# Patient Record
Sex: Female | Born: 1955
Health system: Southern US, Community
[De-identification: ages and names within clinical notes are randomized; demographics above are authoritative.]

## PROBLEM LIST (undated history)

## (undated) DIAGNOSIS — N938 Other specified abnormal uterine and vaginal bleeding: Secondary | ICD-10-CM

## (undated) DIAGNOSIS — R079 Chest pain, unspecified: Secondary | ICD-10-CM

## (undated) DIAGNOSIS — E041 Nontoxic single thyroid nodule: Secondary | ICD-10-CM

## (undated) DIAGNOSIS — F419 Anxiety disorder, unspecified: Secondary | ICD-10-CM

## (undated) DIAGNOSIS — M5030 Other cervical disc degeneration, unspecified cervical region: Secondary | ICD-10-CM

## (undated) DIAGNOSIS — J111 Influenza due to unidentified influenza virus with other respiratory manifestations: Secondary | ICD-10-CM

## (undated) DIAGNOSIS — T8859XA Other complications of anesthesia, initial encounter: Secondary | ICD-10-CM

## (undated) DIAGNOSIS — C9110 Chronic lymphocytic leukemia of B-cell type not having achieved remission: Secondary | ICD-10-CM

## (undated) DIAGNOSIS — R161 Splenomegaly, not elsewhere classified: Secondary | ICD-10-CM

## (undated) DIAGNOSIS — F341 Dysthymic disorder: Secondary | ICD-10-CM

## (undated) DIAGNOSIS — E785 Hyperlipidemia, unspecified: Secondary | ICD-10-CM

## (undated) DIAGNOSIS — B019 Varicella without complication: Secondary | ICD-10-CM

## (undated) DIAGNOSIS — T4145XA Adverse effect of unspecified anesthetic, initial encounter: Secondary | ICD-10-CM

## (undated) DIAGNOSIS — Z9221 Personal history of antineoplastic chemotherapy: Secondary | ICD-10-CM

## (undated) HISTORY — DX: Varicella without complication: B01.9

## (undated) HISTORY — DX: Chronic lymphocytic leukemia of B-cell type not having achieved remission: C91.10

## (undated) HISTORY — DX: Splenomegaly, not elsewhere classified: R16.1

## (undated) HISTORY — DX: Anxiety disorder, unspecified: F41.9

## (undated) HISTORY — DX: Other specified abnormal uterine and vaginal bleeding: N93.8

## (undated) HISTORY — DX: Influenza due to unidentified influenza virus with other respiratory manifestations: J11.1

## (undated) HISTORY — PX: TUBAL LIGATION: SHX77

## (undated) HISTORY — DX: Chest pain, unspecified: R07.9

## (undated) HISTORY — DX: Hyperlipidemia, unspecified: E78.5

## (undated) HISTORY — DX: Nontoxic single thyroid nodule: E04.1

## (undated) HISTORY — DX: Dysthymic disorder: F34.1

---

## 2009-03-06 ENCOUNTER — Ambulatory Visit: Payer: Self-pay | Admitting: Internal Medicine

## 2013-11-06 ENCOUNTER — Other Ambulatory Visit: Payer: Self-pay | Admitting: Family Medicine

## 2013-11-06 ENCOUNTER — Ambulatory Visit: Payer: BC Managed Care – PPO

## 2013-11-06 ENCOUNTER — Ambulatory Visit: Payer: BC Managed Care – PPO | Admitting: Family Medicine

## 2013-11-06 VITALS — BP 132/66 | HR 83 | Temp 97.9°F | Resp 16 | Ht 66.0 in | Wt 146.8 lb

## 2013-11-06 DIAGNOSIS — R110 Nausea: Secondary | ICD-10-CM

## 2013-11-06 DIAGNOSIS — D72820 Lymphocytosis (symptomatic): Secondary | ICD-10-CM

## 2013-11-06 DIAGNOSIS — R079 Chest pain, unspecified: Secondary | ICD-10-CM

## 2013-11-06 DIAGNOSIS — D72829 Elevated white blood cell count, unspecified: Secondary | ICD-10-CM

## 2013-11-06 LAB — POCT CBC
Granulocyte percent: 14.6 %G — AB (ref 37–80)
HEMATOCRIT: 41.5 % (ref 37.7–47.9)
Hemoglobin: 13.3 g/dL (ref 12.2–16.2)
Lymph, poc: 25.8 — AB (ref 0.6–3.4)
MCH: 29 pg (ref 27–31.2)
MCHC: 32 g/dL (ref 31.8–35.4)
MCV: 90.4 fL (ref 80–97)
MID (cbc): 2.1 — AB (ref 0–0.9)
MPV: 11.1 fL (ref 0–99.8)
PLATELET COUNT, POC: 317 10*3/uL (ref 142–424)
POC Granulocyte: 4.8 (ref 2–6.9)
POC LYMPH PERCENT: 79 %L — AB (ref 10–50)
POC MID %: 6.4 %M (ref 0–12)
RBC: 4.59 M/uL (ref 4.04–5.48)
RDW, POC: 15.2 %
WBC: 32.7 10*3/uL — AB (ref 4.6–10.2)

## 2013-11-06 LAB — POCT URINALYSIS DIPSTICK
Bilirubin, UA: NEGATIVE
Blood, UA: NEGATIVE
Glucose, UA: NEGATIVE
KETONES UA: NEGATIVE
Leukocytes, UA: NEGATIVE
Nitrite, UA: NEGATIVE
PH UA: 5.5
Protein, UA: NEGATIVE
Spec Grav, UA: 1.02
UROBILINOGEN UA: 0.2

## 2013-11-06 NOTE — Progress Notes (Signed)
Subjective:    Patient ID: Cathy [name], female    DOB: 1956-01-13, 58 y.o.   MRN: [id number]  11/06/2013  Chest Pain, Neck Pain, Dizziness, Nausea and Arm Pain   HPI This 58 y.o. female presents for evaluation of chest pain, neck pain, nausea, arm pain.  Onset two weeks ago.  Usually occurs during work or when getting ready for work.  B chest pain; child tightness; feels like a belt; B bones of upper arms ache; throat feels tight; ears hurt; temple hurts.  Light-headed sometimes with pain.  Throat feels funny with episodes as well.  No SOB; +bad physical shape; chronic SOB. No diaphoresis with episodes. No cough.  No nausea; two weeks ago, did have four mornings of nausea.  No heartburn or indigestion.  Manager of branch of bank.  Occurs at rest while sitting; no exertional component.   Worked in the yard yesterday without pain.  Feels badly.  No similar symptoms.  No tobacco.  Tons of stress; lots of work stress; lack of employees.  Salaried; works 60 hours per week but this is not new.  Not exercising.  Diagnosed with hyperlipidemia three years ago; adopted a major healthy diet change.  Husband and pt are separating and selling the house.  Two year separation.   Felt badly two weeks ago with nausea; +clammy, +sweats.  Denies fever/chills/sweats now.  Denies sore throat, ear pain constant, cough, vomiting, diarrhea, rash, dysuria, frequency, hematuria.  No leg swelling or calf pain.  No recent travel, sedentary lifestyle, immobilization, surgery.  LMP 2.5 weeks ago.  Continues to have regular menses.    No family hx of early CAD; brother with recent recurrent DVTs; no clotting disorder.   Review of Systems  Constitutional: Negative for fever, chills, diaphoresis and fatigue.  HENT: Negative for congestion, ear pain, postnasal drip, rhinorrhea and sore throat.   Respiratory: Positive for shortness of breath. Negative for cough, wheezing and stridor.   Cardiovascular: Positive for chest pain.  Negative for palpitations and leg swelling.  Gastrointestinal: Negative for nausea, vomiting, abdominal pain, diarrhea and constipation.  Genitourinary: Negative for dysuria, urgency, hematuria and flank pain.  Skin: Negative for rash.  Neurological: Positive for dizziness, light-headedness and headaches. Negative for tremors, syncope, weakness and numbness.  Hematological: Negative for adenopathy.  Psychiatric/Behavioral: The patient is nervous/anxious.     Past Medical History  Diagnosis Date  . Hyperlipidemia    No Known Allergies No current outpatient prescriptions on file.   No current facility-administered medications for this visit.   History   Social History  . Marital Status: Married    Spouse Name: N/A    Number of Children: N/A  . Years of Education: N/A   Occupational History  . Not on file.   Social History Main Topics  . Smoking status: Never Smoker   . Smokeless tobacco: Not on file  . Alcohol Use: No  . Drug Use: No  . Sexual Activity: Not on file   Other Topics Concern  . Not on file   Social History Narrative  . No narrative on file   Family History  Problem Relation Age of Onset  . Stroke Mother 91    CVA  . Stroke Maternal Grandmother   . Stroke Maternal Grandfather   . Deep vein thrombosis Brother 45    recurrent DVTs.       Objective:    BP 132/66  Pulse 83  Temp(Src) 97.9 F (36.6 C) (Oral)  Resp 16  Ht 5\' 6"  (1.676 m)  Wt 146 lb 12.8 oz (66.588 kg)  BMI 23.71 kg/m2  SpO2 99%  LMP 10/20/2013 Physical Exam  Constitutional: She is oriented to person, place, and time. She appears well-developed and well-nourished. No distress.  HENT:  Head: Normocephalic and atraumatic.  Right Ear: External ear normal.  Left Ear: External ear normal.  Nose: Nose normal.  Mouth/Throat: Oropharynx is clear and moist.  Eyes: Conjunctivae and EOM are normal. Pupils are equal, round, and reactive to light.  Neck: Normal range of motion. Neck  supple. Carotid bruit is not present. No thyromegaly present.  Cardiovascular: Normal rate, regular rhythm, normal heart sounds and intact distal pulses.  Exam reveals no gallop and no friction rub.   No murmur heard. Pulmonary/Chest: Effort normal and breath sounds normal. She has no wheezes. She has no rales.  Abdominal: Soft. Bowel sounds are normal. She exhibits no distension and no mass. There is no tenderness. There is no rebound and no guarding.  Lymphadenopathy:    She has no cervical adenopathy.  Neurological: She is alert and oriented to person, place, and time. No cranial nerve deficit.  Skin: Skin is warm and dry. No rash noted. She is not diaphoretic. No erythema. No pallor.  Psychiatric: She has a normal mood and affect. Her behavior is normal.   Results for orders placed in visit on 11/06/13  POCT CBC      Result Value Ref Range   WBC 32.7 (*) 4.6 - 10.2 K/uL   Lymph, poc 25.8 (*) 0.6 - 3.4   POC LYMPH PERCENT 79.0 (*) 10 - 50 %L   MID (cbc) 2.1 (*) 0 - 0.9   POC MID % 6.4  0 - 12 %M   POC Granulocyte 4.8  2 - 6.9   Granulocyte percent 14.6 (*) 37 - 80 %G   RBC 4.59  4.04 - 5.48 M/uL   Hemoglobin 13.3  12.2 - 16.2 g/dL   HCT, POC 41.5  37.7 - 47.9 %   MCV 90.4  80 - 97 fL   MCH, POC 29.0  27 - 31.2 pg   MCHC 32.0  31.8 - 35.4 g/dL   RDW, POC 15.2     Platelet Count, POC 317  142 - 424 K/uL   MPV 11.1  0 - 99.8 fL  POCT URINALYSIS DIPSTICK      Result Value Ref Range   Color, UA yellow     Clarity, UA clear     Glucose, UA neg     Bilirubin, UA neg     Ketones, UA neg     Spec Grav, UA 1.020     Blood, UA neg     pH, UA 5.5     Protein, UA neg     Urobilinogen, UA 0.2     Nitrite, UA neg     Leukocytes, UA Negative     [facility] reading (PRIMARY) by  Dr. Tamala Julian. CXR:  NAD  EKG: NSR; no acute changes.    Assessment & Plan:  Chest pain - Plan: EKG 12-Lead, POCT CBC, COMPLETE METABOLIC PANEL WITH GFR, DG Chest 2 View, Ambulatory referral to Cardiology, CBC with  Differential  Leukocytosis, unspecified - Plan: POCT urinalysis dipstick, Urine culture, CBC with Differential  Nausea alone - Plan: POCT urinalysis dipstick, Urine culture, CBC with Differential  No orders of the defined types were placed in this encounter.    1.  Chest pain:  New. Atypical in nature; no  exertional component.  Associated with recent stressors; also associated with WBC of 32, 000.  No heart murmur heard on exam; normal CXR.  Refer to cardiology for evaluation of chest pain.  To ED for acute worsening. Warrants echo due to associated elevated WBC. 2.  Leukocytosis:  New.  Associated with nausea, chest pain.  CXR normal.  Send urine culture.  Obtain CMET.  Repeat WBC count in upcoming five days; RTC for fever, acute decline.  Recommend echo due to chest pain to evaluate for pericarditis or endocarditis though no distress in office and no heart murmur. 3.  Nausea:  New and now resolved; GERD may be etiology to chest pain; obtain CMET.  No Follow-up on file.     Reginia Forts, M.D.  [facility] [address] [place], [place]  [address] [phone] phone [phone] fax

## 2013-11-06 NOTE — Patient Instructions (Addendum)
1.  Present to emergency department if chest pain becomes much worse. 2.  We will contact you in the upcoming two weeks for appointment with cardiology.   3. Start Aspirin 81mg  one tablet daily.

## 2013-11-07 LAB — CBC WITH DIFFERENTIAL/PLATELET
Basophils Relative: 1 % (ref 0–1)
EOS PCT: 2 % (ref 0–5)
HCT: 39 % (ref 36.0–46.0)
HEMOGLOBIN: 13.3 g/dL (ref 12.0–15.0)
Lymphocytes Relative: 80 % — ABNORMAL HIGH (ref 12–46)
MCH: 29.5 pg (ref 26.0–34.0)
MCHC: 34.1 g/dL (ref 30.0–36.0)
MCV: 86.5 fL (ref 78.0–100.0)
MONOS PCT: 2 % — AB (ref 3–12)
Neutrophils Relative %: 15 % — ABNORMAL LOW (ref 43–77)
Platelets: 288 10*3/uL (ref 150–400)
RBC: 4.51 MIL/uL (ref 3.87–5.11)
RDW: 14.8 % (ref 11.5–15.5)
WBC: 32.5 10*3/uL — ABNORMAL HIGH (ref 4.0–10.5)

## 2013-11-07 LAB — COMPLETE METABOLIC PANEL WITH GFR
ALBUMIN: 4.5 g/dL (ref 3.5–5.2)
ALT: 9 U/L (ref 0–35)
AST: 10 U/L (ref 0–37)
Alkaline Phosphatase: 53 U/L (ref 39–117)
BUN: 20 mg/dL (ref 6–23)
CHLORIDE: 104 meq/L (ref 96–112)
CO2: 23 meq/L (ref 19–32)
Calcium: 9.3 mg/dL (ref 8.4–10.5)
Creat: 0.85 mg/dL (ref 0.50–1.10)
GFR, EST AFRICAN AMERICAN: 89 mL/min
GFR, Est Non African American: 77 mL/min
GLUCOSE: 84 mg/dL (ref 70–99)
POTASSIUM: 4.4 meq/L (ref 3.5–5.3)
Sodium: 139 mEq/L (ref 135–145)
Total Bilirubin: 0.4 mg/dL (ref 0.2–1.2)
Total Protein: 6.9 g/dL (ref 6.0–8.3)

## 2013-11-08 LAB — PATHOLOGIST SMEAR REVIEW

## 2013-11-08 LAB — URINE CULTURE
Colony Count: NO GROWTH
Organism ID, Bacteria: NO GROWTH

## 2013-11-08 LAB — HIV ANTIBODY (ROUTINE TESTING W REFLEX): HIV: NONREACTIVE

## 2013-11-09 ENCOUNTER — Ambulatory Visit: Payer: BC Managed Care – PPO | Admitting: Family Medicine

## 2013-11-09 ENCOUNTER — Other Ambulatory Visit: Payer: Self-pay | Admitting: Family Medicine

## 2013-11-09 VITALS — BP 120/60 | HR 73 | Temp 98.0°F | Resp 16 | Ht 66.0 in | Wt 150.8 lb

## 2013-11-09 DIAGNOSIS — D649 Anemia, unspecified: Secondary | ICD-10-CM

## 2013-11-09 DIAGNOSIS — D72829 Elevated white blood cell count, unspecified: Secondary | ICD-10-CM

## 2013-11-09 DIAGNOSIS — D72819 Decreased white blood cell count, unspecified: Secondary | ICD-10-CM

## 2013-11-09 DIAGNOSIS — R768 Other specified abnormal immunological findings in serum: Secondary | ICD-10-CM

## 2013-11-09 DIAGNOSIS — R110 Nausea: Secondary | ICD-10-CM

## 2013-11-09 LAB — POCT CBC
Granulocyte percent: 15.1 %G — AB (ref 37–80)
HCT, POC: 35.8 % — AB (ref 37.7–47.9)
HEMOGLOBIN: 11.5 g/dL — AB (ref 12.2–16.2)
LYMPH, POC: 23.8 — AB (ref 0.6–3.4)
MCH, POC: 29 pg (ref 27–31.2)
MCHC: 32.1 g/dL (ref 31.8–35.4)
MCV: 90.3 fL (ref 80–97)
MID (CBC): 1.8 — AB (ref 0–0.9)
MPV: 10.4 fL (ref 0–99.8)
POC GRANULOCYTE: 4.6 (ref 2–6.9)
POC LYMPH %: 78.9 % — AB (ref 10–50)
POC MID %: 6 %M (ref 0–12)
Platelet Count, POC: 262 10*3/uL (ref 142–424)
RBC: 3.97 M/uL — AB (ref 4.04–5.48)
RDW, POC: 15.2 %
WBC: 30.2 10*3/uL — AB (ref 4.6–10.2)

## 2013-11-09 LAB — POCT RAPID STREP A (OFFICE): Rapid Strep A Screen: NEGATIVE

## 2013-11-09 LAB — EPSTEIN-BARR VIRUS VCA ANTIBODY PANEL
EBV EA IgG: <5 U/mL (ref <9.0)
EBV NA IgG: 391 U/mL — ABNORMAL HIGH (ref <18.0)
EBV VCA IGM: 18.1 U/mL (ref <36.0)
EBV VCA IgG: >750 U/mL — ABNORMAL HIGH (ref <18.0)

## 2013-11-09 NOTE — Progress Notes (Signed)
Subjective:    Patient ID: Cathy [name], female    DOB: [birth year], 58 y.o.   MRN: [id number]  11/09/2013  Follow-up   HPI This 58 y.o. female presents for one week evaluation of leukocytosis. Evaluated last week and WBC count elevated at 32,000.  Feels unchanged from last visit; denies fever/chills/sweats. Denies sore throat, ear pain, rhinorrhea, cough. Denies dysuria, urgency, hematuria.  Denies vomiting, diarrhea.   +nausea; no stomach pain; no diarrhea; no bloody stools or black stools.  +headaches.  No fatigue or malaise.  No unexpected weight loss.  No rash. No joint pains or swelling; no chronic back pain or neck pain.  Continues to have intermittent chest pain that radiates into arm and jaw; usually occurs at rest while at work.  Referral to cardiology has been placed; pt does not have appointment yet.  No unusual bruising or swollen lymph nodes.  Health Maintenance: Pap smear 2012. Mammogram never. Colonoscopy never. Tetanus vaccine years ago. Flu vaccine never. Eye exam 2012. Dental exam [facility] in [place].  Review of Systems  Constitutional: Negative for fever, chills, diaphoresis, activity change, appetite change, fatigue and unexpected weight change.  HENT: Negative for congestion, dental problem, drooling, ear discharge, ear pain, postnasal drip, rhinorrhea, sinus pressure, sore throat, trouble swallowing and voice change.   Eyes: Negative for visual disturbance.  Respiratory: Negative for cough and shortness of breath.   Cardiovascular: Negative for chest pain, palpitations and leg swelling.  Gastrointestinal: Positive for nausea. Negative for vomiting, abdominal pain, diarrhea, constipation, blood in stool, abdominal distention, anal bleeding and rectal pain.  Endocrine: Negative for cold intolerance, heat intolerance, polydipsia, polyphagia and polyuria.  Genitourinary: Negative for dysuria, urgency, frequency, hematuria, flank pain and genital sores.  Musculoskeletal:  Negative for myalgias, back pain, joint swelling, arthralgias, gait problem, neck pain and neck stiffness.  Skin: Negative for color change, pallor, rash and wound.  Neurological: Negative for dizziness, tremors, seizures, syncope, facial asymmetry, speech difficulty, weakness, light-headedness, numbness and headaches.  Hematological: Negative for adenopathy. Does not bruise/bleed easily.  Psychiatric/Behavioral: The patient is nervous/anxious.     Past Medical History  Diagnosis Date  . Hyperlipidemia   . Chest pain    Past Surgical History  Procedure Laterality Date  . Tubal ligation      No Known Allergies Current Outpatient Prescriptions  Medication Sig Dispense Refill  . ondansetron (ZOFRAN-ODT) 8 MG disintegrating tablet Take 1 tablet (8 mg total) by mouth every 8 (eight) hours as needed for nausea. 20 tablet 1   No current facility-administered medications for this visit.       Objective:    BP 120/60 mmHg  Pulse 73  Temp(Src) 98 F (36.7 C) (Oral)  Resp 16  Ht 5' 6"  (1.676 m)  Wt 150 lb 12.8 oz (68.402 kg)  BMI 24.35 kg/m2  SpO2 99%  LMP 10/20/2013 Physical Exam  Constitutional: She is oriented to person, place, and time. She appears well-developed and well-nourished. No distress.  HENT:  Head: Normocephalic and atraumatic.  Right Ear: External ear normal.  Left Ear: External ear normal.  Nose: Nose normal.  Mouth/Throat: Oropharynx is clear and moist.  Eyes: Conjunctivae and EOM are normal. Pupils are equal, round, and reactive to light.  Neck: Normal range of motion. Neck supple. Carotid bruit is not present. No thyromegaly present.  Cardiovascular: Normal rate, regular rhythm, normal heart sounds and intact distal pulses.  Exam reveals no gallop and no friction rub.   No murmur heard. Pulmonary/Chest: Effort normal  and breath sounds normal. She has no wheezes. She has no rales.  Abdominal: Soft. Bowel sounds are normal. She exhibits no distension and no  mass. There is no tenderness. There is no rebound and no guarding.  Lymphadenopathy:    She has no cervical adenopathy.  Neurological: She is alert and oriented to person, place, and time. No cranial nerve deficit.  Skin: Skin is warm and dry. No rash noted. She is not diaphoretic. No erythema. No pallor.  Psychiatric: She has a normal mood and affect. Her behavior is normal.   Results for orders placed or performed in visit on 11/09/13  Culture, Group A Strep  Result Value Ref Range   Organism ID, Bacteria Normal Upper Respiratory Flora    Organism ID, Bacteria No Beta Hemolytic Streptococci Isolated   ANA  Result Value Ref Range   ANA Ser Ql NEG NEGATIVE  Rheumatoid factor  Result Value Ref Range   Rhuematoid fact SerPl-aCnc 30 (H) <=14 IU/mL  POCT CBC  Result Value Ref Range   WBC 30.2 (A) 4.6 - 10.2 K/uL   Lymph, poc 23.8 (A) 0.6 - 3.4   POC LYMPH PERCENT 78.9 (A) 10 - 50 %L   MID (cbc) 1.8 (A) 0 - 0.9   POC MID % 6.0 0 - 12 %M   POC Granulocyte 4.6 2 - 6.9   Granulocyte percent 15.1 (A) 37 - 80 %G   RBC 3.97 (A) 4.04 - 5.48 M/uL   Hemoglobin 11.5 (A) 12.2 - 16.2 g/dL   HCT, POC 35.8 (A) 37.7 - 47.9 %   MCV 90.3 80 - 97 fL   MCH, POC 29.0 27 - 31.2 pg   MCHC 32.1 31.8 - 35.4 g/dL   RDW, POC 15.2 %   Platelet Count, POC 262 142 - 424 K/uL   MPV 10.4 0 - 99.8 fL  POCT rapid strep A  Result Value Ref Range   Rapid Strep A Screen Negative Negative       Assessment & Plan:  Leukocytopenia, unspecified - Plan: POCT CBC, Culture, Group A Strep  Leukocytosis, unspecified - Plan: Ambulatory referral to Hematology, POCT rapid strep A, ANA, Rheumatoid factor, Culture, Group A Strep  Rheumatoid factor positive - Plan: Cyclic Citrul Peptide Antibody, IGG  Nausea without vomiting  Anemia, unspecified anemia type   1. Leukocytosis:  Persistent; obtain throat culture, rapid strep, ANA, RF, ESR. Refer to hematology. 2.  Positive Rheumatoid Factor: New. Obtain CCP.  After  hematology consultation, consider rheumatology consult. 3. Nausea: persistent; rx for Zofran provided.  Recommend BRAT diet, hydration; recommend OTC Prilosec but patient hesitant to start other medication at this time. 4. Anemia: Persistent; refer to hematology; no previous colonoscopy.   Meds ordered this encounter  Medications  . ondansetron (ZOFRAN-ODT) 8 MG disintegrating tablet    Sig: Take 1 tablet (8 mg total) by mouth every 8 (eight) hours as needed for nausea.    Dispense:  20 tablet    Refill:  1    Return if symptoms worsen or fail to improve.   Reginia Forts, M.D.  [facility] [address] [place], [place]  [address] [phone] phone [phone] fax   .

## 2013-11-10 LAB — RHEUMATOID FACTOR: Rhuematoid fact SerPl-aCnc: 30 IU/mL — ABNORMAL HIGH (ref <=14)

## 2013-11-11 LAB — ANA: Anti Nuclear Antibody(ANA): NEGATIVE

## 2013-11-12 LAB — CULTURE, GROUP A STREP: Organism ID, Bacteria: NORMAL

## 2013-11-14 LAB — CYCLIC CITRUL PEPTIDE ANTIBODY, IGG: Cyclic Citrullin Peptide Ab: <2 U/mL (ref 0.0–5.0)

## 2013-11-17 ENCOUNTER — Encounter: Payer: Self-pay | Admitting: *Deleted

## 2013-11-17 MED ORDER — ONDANSETRON 8 MG PO TBDP: 8.0000 mg | ORAL_TABLET | Freq: Three times a day (TID) | ORAL | Status: DC | PRN

## 2013-11-23 ENCOUNTER — Ambulatory Visit: Payer: Self-pay | Admitting: Internal Medicine

## 2013-11-23 ENCOUNTER — Ambulatory Visit: Payer: Self-pay | Admitting: Oncology

## 2013-11-23 LAB — CBC CANCER CENTER
BASOS ABS: 0.1 x10 3/mm (ref 0.0–0.1)
BASOS PCT: 0.3 %
EOS ABS: 0.2 x10 3/mm (ref 0.0–0.7)
EOS PCT: 0.5 %
HCT: 40.2 % (ref 35.0–47.0)
HGB: 12.9 g/dL (ref 12.0–16.0)
LYMPHS ABS: 27.8 x10 3/mm — AB (ref 1.0–3.6)
Lymphocyte %: 83.7 %
MCH: 28.7 pg (ref 26.0–34.0)
MCHC: 32 g/dL (ref 32.0–36.0)
MCV: 90 fL (ref 80–100)
Monocyte #: 0.7 x10 3/mm (ref 0.2–0.9)
Monocyte %: 2.3 %
Neutrophil #: 4.4 x10 3/mm (ref 1.4–6.5)
Neutrophil %: 13.2 %
Platelet: 324 x10 3/mm (ref 150–440)
RBC: 4.48 10*6/uL (ref 3.80–5.20)
RDW: 14.7 % — AB (ref 11.5–14.5)
WBC: 33.2 x10 3/mm — ABNORMAL HIGH (ref 3.6–11.0)

## 2013-11-23 LAB — LACTATE DEHYDROGENASE: LDH: 161 U/L

## 2013-12-01 ENCOUNTER — Ambulatory Visit (INDEPENDENT_AMBULATORY_CARE_PROVIDER_SITE_OTHER): Payer: BC Managed Care – PPO | Admitting: Cardiology

## 2013-12-01 ENCOUNTER — Encounter: Payer: Self-pay | Admitting: Cardiology

## 2013-12-01 VITALS — BP 120/68 | HR 85 | Ht 65.5 in | Wt 147.4 lb

## 2013-12-01 DIAGNOSIS — R079 Chest pain, unspecified: Secondary | ICD-10-CM

## 2013-12-01 NOTE — Patient Instructions (Signed)
Your physician has requested that you have en exercise stress myoview. For further information please visit [email or web address]. Please follow instruction sheet, as given.  Your physician has requested that you have an echocardiogram. Echocardiography is a painless test that uses sound waves to create images of your heart. It provides your doctor with information about the size and shape of your heart and how well your heart's chambers and valves are working. This procedure takes approximately one hour. There are no restrictions for this procedure.  Your physician recommends that you schedule a follow-up appointment as needed

## 2013-12-01 NOTE — Progress Notes (Signed)
[facility] [place], [place]  [address] Phone: [phone] Fax:  [phone]  Date:  12/01/2013   ID:  Cathy [name], DOB 03/17/1956, MRN [id number]  PCP:  No primary provider on file.  Cardiologist:  Fransico Him, MD     History of Present Illness: Cathy [name] is a 58 y.o. female with history of dyslipidemia who presented to her PCP with complaints of chest pain, neck pain, nausea and arm pain.  This started in March and was occuring up to 10 times a day but it has improved.  She described it as a discomfort in her chest that would radiated into her arms and neck.  She described it as an uncomfortable pressure and throat would get tight.  She denied any SOB or diaphoresis.  She has been having some nausea but this does not occur with the CP.  It is nonexertional.  She is under a lot of stress at work and she and her husband are separating and selling her house.  She has not been to the gym much but usually does not get it at the gym or working in the yard.  She denies any palpitations.  She has had some problems with vertigo associated with nausea but does not coincide with her chest discomfort.  There is no family history of heart disease.   Wt Readings from Last 3 Encounters:  11/09/13 150 lb 12.8 oz (68.402 kg)  11/06/13 146 lb 12.8 oz (66.588 kg)     Past Medical History  Diagnosis Date  . Hyperlipidemia     Current Outpatient Prescriptions  Medication Sig Dispense Refill  . ondansetron (ZOFRAN-ODT) 8 MG disintegrating tablet Take 1 tablet (8 mg total) by mouth every 8 (eight) hours as needed for nausea.  20 tablet  1   No current facility-administered medications for this visit.    Allergies:   No Known Allergies  Social History:  The patient  reports that she has never smoked. She does not have any smokeless tobacco history on file. She reports that she does not drink alcohol or use illicit drugs.   Family History:  The patient's family history includes Deep  vein thrombosis (age of onset: 52) in her brother; Stroke in her maternal grandfather and maternal grandmother; Stroke (age of onset: 59) in her mother.   ROS:  Please see the history of present illness.      All other systems reviewed and negative.   PHYSICAL EXAM: VS:  LMP 10/20/2013 Well nourished, well developed, in no acute distress HEENT: normal Neck: no JVD Cardiac:  normal S1, S2; RRR; no murmur Lungs:  clear to auscultation bilaterally, no wheezing, rhonchi or rales Abd: soft, nontender, no hepatomegaly Ext: no edema Skin: warm and dry Neuro:  CNs 2-12 intact, no focal abnormalities noted  EKG:  NSR on 11/06/2013     ASSESSMENT AND PLAN:  1. Chest pain with typical and atypical symptoms.  Her EKG is nonischemic.  Her only CFR is dyslipidemia.  Her symptoms are concerning though in that her chest discomfort is severe when it occurs and goes into her arms, throat and neck.  This could be due to GERD with esophageal reflux.  She is under a lot of stress. - I will get a stress myoview to rule out ischemia - check 2D echo to assess LVF - If stress test is normal then will try a trial of a PPI  Followup PRN based on results of study  Signed,  Fransico Him, MD 12/01/2013 3:29 PM

## 2013-12-16 ENCOUNTER — Ambulatory Visit: Payer: Self-pay | Admitting: Oncology

## 2013-12-16 ENCOUNTER — Ambulatory Visit: Payer: Self-pay | Admitting: Internal Medicine

## 2013-12-20 ENCOUNTER — Ambulatory Visit (HOSPITAL_COMMUNITY): Payer: BC Managed Care – PPO | Attending: Cardiology | Admitting: Radiology

## 2013-12-20 ENCOUNTER — Ambulatory Visit (HOSPITAL_BASED_OUTPATIENT_CLINIC_OR_DEPARTMENT_OTHER): Payer: BC Managed Care – PPO | Admitting: Cardiology

## 2013-12-20 VITALS — BP 128/68 | HR 71 | Ht 65.5 in | Wt 147.0 lb

## 2013-12-20 DIAGNOSIS — R0602 Shortness of breath: Secondary | ICD-10-CM

## 2013-12-20 DIAGNOSIS — R079 Chest pain, unspecified: Secondary | ICD-10-CM

## 2013-12-20 MED ORDER — TECHNETIUM TC 99M SESTAMIBI GENERIC - CARDIOLITE
10.0000 | Freq: Once | INTRAVENOUS | Status: AC | PRN
  Administered 2013-12-20: 10 via INTRAVENOUS

## 2013-12-20 MED ORDER — TECHNETIUM TC 99M SESTAMIBI GENERIC - CARDIOLITE
30.0000 | Freq: Once | INTRAVENOUS | Status: AC | PRN
  Administered 2013-12-20: 30 via INTRAVENOUS

## 2013-12-20 NOTE — Progress Notes (Signed)
Echo performed. 

## 2013-12-20 NOTE — Progress Notes (Signed)
[facility] [address] [place], [place] [address] [phone]    Cardiology Nuclear Med Study  Cathy [name] is a 58 y.o. female     MRN : [id number]     DOB: 1956-05-10  Procedure Date: 12/20/2013  Nuclear Med Background Indication for Stress Test:  Evaluation for Ischemia History:  No Hx of CAD Cardiac Risk Factors: Lipids  Symptoms:  Chest Pain   Nuclear Pre-Procedure Caffeine/Decaff Intake:  None NPO After: 4:00am   Lungs:  clear O2 Sat: 99% on room air. IV 0.9% NS with Angio Cath:  22g  IV Site: R Antecubital  IV Started by:  Perrin Maltese, EMT-P  Chest Size (in):  36 Cup Size: B  Height: 5' 5.5" (1.664 m)  Weight:  147 lb (66.679 kg)  BMI:  Body mass index is 24.08 kg/(m^2). Tech Comments:  No RX    Nuclear Med Study 1 or 2 day study: 1 day  Stress Test Type:  Stress  Reading MD: n/a  Order Authorizing Provider:  T.Turner MD  Resting Radionuclide: Technetium 46m Sestamibi  Resting Radionuclide Dose: 11.0 mCi   Stress Radionuclide:  Technetium 70m Sestamibi  Stress Radionuclide Dose: 33.0 mCi           Stress Protocol Rest HR: 71 Stress HR: 153  Rest BP: 128/68 Stress BP: 181/62  Exercise Time (min): 8:00 METS: 10.1           Dose of Adenosine (mg):  n/a Dose of Lexiscan: n/a mg  Dose of Atropine (mg): n/a Dose of Dobutamine: n/a mcg/kg/min (at max HR)  Stress Test Technologist: Glade Lloyd, BS-ES  Nuclear Technologist:  Charlton Amor, CNMT     Rest Procedure:  Myocardial perfusion imaging was performed at rest 45 minutes following the intravenous administration of Technetium 69m Sestamibi. Rest ECG: NSR - Normal EKG  Stress Procedure:  The patient exercised on the treadmill utilizing the Bruce Protocol for 8:00 minutes. The patient stopped due to fatigue and denied any chest pain.  Technetium 26m Sestamibi was injected at peak exercise and myocardial perfusion imaging was performed after a brief delay. Stress ECG:  No significant change from baseline ECG  QPS Raw Data Images:  Normal; no motion artifact; normal heart/lung ratio. Stress Images:  Normal homogeneous uptake in all areas of the myocardium. Rest Images:  Normal homogeneous uptake in all areas of the myocardium. Subtraction (SDS):  No evidence of ischemia. Transient Ischemic Dilatation (Normal <1.22):  0.96 Lung/Heart Ratio (Normal <0.45):  0.28  Quantitative Gated Spect Images QGS EDV:  83 ml QGS ESV:  30 ml  Impression Exercise Capacity:  Good exercise capacity. BP Response:  Normal blood pressure response. Clinical Symptoms:  No chest pain. ECG Impression:  No significant ST segment change suggestive of ischemia. Comparison with Prior Nuclear Study: No previous nuclear study performed  Overall Impression:  Normal stress nuclear study.  LV Ejection Fraction: 64%.  LV Wall Motion:  Normal Wall Motion  Darlin Coco MD

## 2013-12-23 ENCOUNTER — Telehealth: Payer: Self-pay | Admitting: Cardiology

## 2013-12-23 NOTE — Telephone Encounter (Signed)
New message ° ° ° ° ° °Returning a nurses call °

## 2014-01-03 LAB — CBC CANCER CENTER
Basophil #: 0.1 x10 3/mm (ref 0.0–0.1)
Basophil %: 0.2 %
EOS PCT: 0.3 %
Eosinophil #: 0.2 x10 3/mm (ref 0.0–0.7)
HCT: 38.3 % (ref 35.0–47.0)
HGB: 12.7 g/dL (ref 12.0–16.0)
Lymphocyte #: 42 x10 3/mm — ABNORMAL HIGH (ref 1.0–3.6)
Lymphocyte %: 87.4 %
MCH: 29.2 pg (ref 26.0–34.0)
MCHC: 33.2 g/dL (ref 32.0–36.0)
MCV: 88 fL (ref 80–100)
MONO ABS: 1.2 x10 3/mm — AB (ref 0.2–0.9)
MONOS PCT: 2.4 %
NEUTROS ABS: 4.7 x10 3/mm (ref 1.4–6.5)
Neutrophil %: 9.7 %
Platelet: 312 x10 3/mm (ref 150–440)
RBC: 4.36 10*6/uL (ref 3.80–5.20)
RDW: 14.5 % (ref 11.5–14.5)
WBC: 48.1 x10 3/mm — ABNORMAL HIGH (ref 3.6–11.0)

## 2014-01-16 ENCOUNTER — Ambulatory Visit: Payer: Self-pay | Admitting: Oncology

## 2014-01-16 ENCOUNTER — Ambulatory Visit: Payer: Self-pay | Admitting: Internal Medicine

## 2014-02-15 ENCOUNTER — Ambulatory Visit: Payer: Self-pay | Admitting: Oncology

## 2014-04-12 ENCOUNTER — Ambulatory Visit: Payer: Self-pay | Admitting: Oncology

## 2014-04-12 LAB — CBC CANCER CENTER
Basophil #: 0.1 x10 3/mm (ref 0.0–0.1)
Basophil %: 0.1 %
EOS ABS: 0.1 x10 3/mm (ref 0.0–0.7)
Eosinophil %: 0.2 %
HCT: 37.7 % (ref 35.0–47.0)
HGB: 11.8 g/dL — ABNORMAL LOW (ref 12.0–16.0)
LYMPHS PCT: 89.7 %
Lymphocyte #: 46.4 x10 3/mm — ABNORMAL HIGH (ref 1.0–3.6)
MCH: 28.5 pg (ref 26.0–34.0)
MCHC: 31.4 g/dL — AB (ref 32.0–36.0)
MCV: 91 fL (ref 80–100)
MONO ABS: 1.2 x10 3/mm — AB (ref 0.2–0.9)
Monocyte %: 2.4 %
NEUTROS ABS: 3.9 x10 3/mm (ref 1.4–6.5)
NEUTROS PCT: 7.6 %
PLATELETS: 298 x10 3/mm (ref 150–440)
RBC: 4.15 10*6/uL (ref 3.80–5.20)
RDW: 14.4 % (ref 11.5–14.5)
WBC: 51.7 x10 3/mm — ABNORMAL HIGH (ref 3.6–11.0)

## 2014-04-18 ENCOUNTER — Ambulatory Visit: Payer: Self-pay | Admitting: Oncology

## 2014-06-28 ENCOUNTER — Ambulatory Visit: Payer: Self-pay | Admitting: Oncology

## 2014-06-28 LAB — CBC CANCER CENTER
Basophil #: 0.1 x10 3/mm (ref 0.0–0.1)
Basophil %: 0.1 %
Eosinophil #: 0.2 x10 3/mm (ref 0.0–0.7)
Eosinophil %: 0.2 %
HCT: 40.4 % (ref 35.0–47.0)
HGB: 12.6 g/dL (ref 12.0–16.0)
LYMPHS PCT: 91.5 %
Lymphocyte #: 63.6 x10 3/mm — ABNORMAL HIGH (ref 1.0–3.6)
MCH: 28.6 pg (ref 26.0–34.0)
MCHC: 31.1 g/dL — ABNORMAL LOW (ref 32.0–36.0)
MCV: 92 fL (ref 80–100)
MONOS PCT: 1.3 %
Monocyte #: 0.9 x10 3/mm (ref 0.2–0.9)
NEUTROS ABS: 4.8 x10 3/mm (ref 1.4–6.5)
Neutrophil %: 6.9 %
Platelet: 283 x10 3/mm (ref 150–440)
RBC: 4.39 10*6/uL (ref 3.80–5.20)
RDW: 14.3 % (ref 11.5–14.5)
WBC: 69.5 x10 3/mm — AB (ref 3.6–11.0)

## 2014-07-18 ENCOUNTER — Ambulatory Visit: Payer: Self-pay | Admitting: Oncology

## 2014-08-23 ENCOUNTER — Ambulatory Visit: Payer: Self-pay | Admitting: Oncology

## 2014-08-23 LAB — CBC CANCER CENTER
BASOS PCT: 0.2 %
Basophil #: 0.2 x10 3/mm — ABNORMAL HIGH (ref 0.0–0.1)
Eosinophil #: 0.4 x10 3/mm (ref 0.0–0.7)
Eosinophil %: 0.5 %
HCT: 39.3 % (ref 35.0–47.0)
HGB: 12.4 g/dL (ref 12.0–16.0)
LYMPHS ABS: 72.2 x10 3/mm — AB (ref 1.0–3.6)
Lymphocyte %: 90.4 %
MCH: 28.9 pg (ref 26.0–34.0)
MCHC: 31.6 g/dL — ABNORMAL LOW (ref 32.0–36.0)
MCV: 92 fL (ref 80–100)
MONO ABS: 1.9 x10 3/mm — AB (ref 0.2–0.9)
Monocyte %: 2.4 %
NEUTROS PCT: 6.5 %
Neutrophil #: 5.2 x10 3/mm (ref 1.4–6.5)
Platelet: 302 x10 3/mm (ref 150–440)
RBC: 4.3 10*6/uL (ref 3.80–5.20)
RDW: 14.4 % (ref 11.5–14.5)
WBC: 79.9 x10 3/mm — ABNORMAL HIGH (ref 3.6–11.0)

## 2014-08-23 LAB — LACTATE DEHYDROGENASE: LDH: 171 U/L (ref 81–246)

## 2014-09-18 ENCOUNTER — Ambulatory Visit: Payer: Self-pay | Admitting: Oncology

## 2014-11-23 ENCOUNTER — Ambulatory Visit
Admit: 2014-11-23 | Disposition: A | Discharge status: Home / Self Care | Payer: Self-pay | Attending: Family Medicine | Admitting: Family Medicine

## 2014-11-28 ENCOUNTER — Ambulatory Visit
Admit: 2014-11-28 | Disposition: A | Discharge status: Home / Self Care | Payer: Self-pay | Attending: Oncology | Admitting: Oncology

## 2014-11-30 ENCOUNTER — Ambulatory Visit
Admit: 2014-11-30 | Disposition: A | Discharge status: Home / Self Care | Payer: Self-pay | Attending: Oncology | Admitting: Oncology

## 2014-11-30 LAB — CBC CANCER CENTER
BANDS NEUTROPHIL: 1 %
Basophil: 1 %
Comment - H1-Com1: NORMAL
Comment - H1-Com2: NORMAL
HCT: 38.9 % (ref 35.0–47.0)
HGB: 11.9 g/dL — AB (ref 12.0–16.0)
Lymphocytes: 83 %
MCH: 28 pg (ref 26.0–34.0)
MCHC: 30.6 g/dL — AB (ref 32.0–36.0)
MCV: 92 fL (ref 80–100)
MONOS PCT: 1 %
PLATELETS: 333 x10 3/mm (ref 150–440)
RBC: 4.25 10*6/uL (ref 3.80–5.20)
RDW: 14.2 % (ref 11.5–14.5)
Segmented Neutrophils: 4 %
Variant Lymphocyte: 10 %
WBC: 121.4 x10 3/mm — ABNORMAL HIGH (ref 3.6–11.0)

## 2014-11-30 LAB — LACTATE DEHYDROGENASE: LDH: 168 U/L

## 2014-12-06 LAB — CBC CANCER CENTER
BASOS ABS: 1 %
BASOS ABS: 1 x10 3/mm — AB (ref 0.0–0.1)
BASOS PCT: 1 %
Bands: 1 %
HCT: 37.8 % (ref 35.0–47.0)
HGB: 11.8 g/dL — ABNORMAL LOW (ref 12.0–16.0)
LYMPHS PCT: 80 %
LYMPHS PCT: 89 %
MCH: 28.5 pg (ref 26.0–34.0)
MCHC: 31.1 g/dL — ABNORMAL LOW (ref 32.0–36.0)
MCV: 92 fL (ref 80–100)
Monocyte %: 3 %
Monocytes: 3 %
NEUTROS PCT: 8 %
PLATELETS: 378 x10 3/mm (ref 150–440)
RBC: 4.13 10*6/uL (ref 3.80–5.20)
RDW: 14.5 % (ref 11.5–14.5)
Segmented Neutrophils: 8 %
Variant Lymphocyte: 7 %
WBC: 124.3 x10 3/mm — AB (ref 3.6–11.0)

## 2014-12-06 LAB — LACTATE DEHYDROGENASE: LDH: 196 U/L — AB

## 2014-12-13 LAB — CBC CANCER CENTER
Basophil #: 0.1 x10 3/mm (ref 0.0–0.1)
Basophil %: 0.2 %
EOS ABS: 0.6 x10 3/mm (ref 0.0–0.7)
EOS PCT: 1 %
HCT: 37.7 % (ref 35.0–47.0)
HGB: 12 g/dL (ref 12.0–16.0)
Lymphocyte #: 53.1 x10 3/mm — ABNORMAL HIGH (ref 1.0–3.6)
Lymphocyte %: 84 %
MCH: 28.2 pg (ref 26.0–34.0)
MCHC: 31.8 g/dL — ABNORMAL LOW (ref 32.0–36.0)
MCV: 89 fL (ref 80–100)
Monocyte #: 1.4 x10 3/mm — ABNORMAL HIGH (ref 0.2–0.9)
Monocyte %: 2.2 %
NEUTROS ABS: 8 x10 3/mm — AB (ref 1.4–6.5)
NEUTROS PCT: 12.6 %
PLATELETS: 327 x10 3/mm (ref 150–440)
RBC: 4.25 10*6/uL (ref 3.80–5.20)
RDW: 14.6 % — AB (ref 11.5–14.5)
WBC: 63.2 x10 3/mm — ABNORMAL HIGH (ref 3.6–11.0)

## 2014-12-13 LAB — BASIC METABOLIC PANEL
ANION GAP: 5 — AB (ref 7–16)
BUN: 12 mg/dL
Calcium, Total: 8.6 mg/dL — ABNORMAL LOW
Chloride: 107 mmol/L
Co2: 24 mmol/L
Creatinine: 0.78 mg/dL
EGFR (Non-African Amer.): >60
Glucose: 103 mg/dL — ABNORMAL HIGH
POTASSIUM: 3.9 mmol/L
Sodium: 136 mmol/L

## 2014-12-13 LAB — CREATININE, SERUM: Creatine, Serum: 0.78

## 2014-12-15 ENCOUNTER — Other Ambulatory Visit: Payer: Self-pay | Admitting: Oncology

## 2014-12-15 DIAGNOSIS — C9110 Chronic lymphocytic leukemia of B-cell type not having achieved remission: Secondary | ICD-10-CM

## 2014-12-20 ENCOUNTER — Ambulatory Visit: Payer: Self-pay

## 2014-12-20 ENCOUNTER — Ambulatory Visit: Payer: Self-pay | Admitting: Oncology

## 2014-12-20 ENCOUNTER — Other Ambulatory Visit: Payer: Self-pay

## 2014-12-20 ENCOUNTER — Inpatient Hospital Stay: Payer: BLUE CROSS/BLUE SHIELD

## 2014-12-20 ENCOUNTER — Inpatient Hospital Stay (HOSPITAL_BASED_OUTPATIENT_CLINIC_OR_DEPARTMENT_OTHER): Payer: BLUE CROSS/BLUE SHIELD | Admitting: Oncology

## 2014-12-20 ENCOUNTER — Inpatient Hospital Stay: Payer: BLUE CROSS/BLUE SHIELD | Attending: Oncology

## 2014-12-20 VITALS — BP 107/66 | HR 81 | Temp 95.2°F | Resp 18 | Wt 142.6 lb

## 2014-12-20 DIAGNOSIS — E785 Hyperlipidemia, unspecified: Secondary | ICD-10-CM

## 2014-12-20 DIAGNOSIS — Z79899 Other long term (current) drug therapy: Secondary | ICD-10-CM

## 2014-12-20 DIAGNOSIS — Z5111 Encounter for antineoplastic chemotherapy: Secondary | ICD-10-CM | POA: Insufficient documentation

## 2014-12-20 DIAGNOSIS — C9110 Chronic lymphocytic leukemia of B-cell type not having achieved remission: Secondary | ICD-10-CM

## 2014-12-20 LAB — CBC WITH DIFFERENTIAL/PLATELET
Basophils Absolute: 0.1 10*3/uL (ref 0–0.1)
EOS ABS: 0.3 10*3/uL (ref 0–0.7)
Eosinophils Relative: 1 %
HEMATOCRIT: 38.2 % (ref 35.0–47.0)
HEMOGLOBIN: 12.1 g/dL (ref 12.0–16.0)
Lymphocytes Relative: 89 %
Lymphs Abs: 60.3 10*3/uL — ABNORMAL HIGH (ref 1.0–3.6)
MCH: 28.5 pg (ref 26.0–34.0)
MCHC: 31.8 g/dL — AB (ref 32.0–36.0)
MCV: 89.8 fL (ref 80.0–100.0)
Monocytes Absolute: 1.2 10*3/uL — ABNORMAL HIGH (ref 0.2–0.9)
NEUTROS ABS: 5.4 10*3/uL (ref 1.4–6.5)
Neutrophils Relative %: 8 %
Platelets: 294 10*3/uL (ref 150–440)
RBC: 4.25 MIL/uL (ref 3.80–5.20)
RDW: 14.6 % — ABNORMAL HIGH (ref 11.5–14.5)
WBC: 67.3 10*3/uL (ref 3.6–11.0)

## 2014-12-20 LAB — BASIC METABOLIC PANEL
Anion gap: 6 (ref 5–15)
BUN: 15 mg/dL (ref 6–20)
CALCIUM: 8.6 mg/dL — AB (ref 8.9–10.3)
CHLORIDE: 107 mmol/L (ref 101–111)
CO2: 25 mmol/L (ref 22–32)
CREATININE: 0.76 mg/dL (ref 0.44–1.00)
GFR calc non Af Amer: >60 mL/min (ref >60)
Glucose, Bld: 97 mg/dL (ref 65–99)
Potassium: 4.2 mmol/L (ref 3.5–5.1)
Sodium: 138 mmol/L (ref 135–145)

## 2014-12-20 MED ORDER — DIPHENHYDRAMINE HCL 25 MG PO CAPS: 50.0000 mg | ORAL_CAPSULE | Freq: Once | ORAL | Status: DC

## 2014-12-20 MED ORDER — DIPHENHYDRAMINE HCL 50 MG/ML IJ SOLN
25.0000 mg | Freq: Once | INTRAMUSCULAR | Status: AC
  Administered 2014-12-20: 50 mg via INTRAVENOUS
  Filled 2014-12-20: qty 1

## 2014-12-20 MED ORDER — ACETAMINOPHEN 325 MG PO TABS
650.0000 mg | ORAL_TABLET | Freq: Once | ORAL | Status: AC
  Administered 2014-12-20: 650 mg via ORAL
  Filled 2014-12-20: qty 2

## 2014-12-20 MED ORDER — SODIUM CHLORIDE 0.9 % IV SOLN
Freq: Once | INTRAVENOUS | Status: AC
  Administered 2014-12-20: 12:00:00 via INTRAVENOUS
  Filled 2014-12-20: qty 250

## 2014-12-20 MED ORDER — SODIUM CHLORIDE 0.9 % IV SOLN: 375.0000 mg/m2 | Freq: Once | INTRAVENOUS | Status: DC

## 2014-12-20 MED ORDER — SODIUM CHLORIDE 0.9 % IV SOLN
600.0000 mg | Freq: Once | INTRAVENOUS | Status: AC
  Administered 2014-12-20: 600 mg via INTRAVENOUS
  Filled 2014-12-20: qty 50

## 2014-12-20 NOTE — Progress Notes (Signed)
[facility]  [phone] [phone]) [phone]  ID: Cathy [name] OB: [birth year]  MR#: [id number]  VZD#:[id number]  Patient Care Team: Wardell Honour, MD as PCP - General (Family Medicine)  CHIEF COMPLAINT:  CLL, Rai stage 1  INTERVAL HISTORY: Patient returns to clinic today for further evaluation and consideration of cycle 3 of 4 of weekly Rituxan. She no longer is complaining of other B symptoms. Since discontinue her allopurinol, her diarrhea has resolved. She has no other neurologic complaints.  She denies any fevers or weight loss.  She has no chest pain and denies any shortness of breath or cough.  She denies any nausea, vomiting, constipation, or diarrhea.  She has no melena or hematochezia.  She has no urinary complaints.  Patient offers no further specific complaints today.  REVIEW OF SYSTEMS:   Review of Systems  Constitutional: Negative for fever and weight loss.  Endo/Heme/Allergies: Negative.     As per HPI. Otherwise, a complete review of systems is negatve.  PAST MEDICAL HISTORY: Past Medical History  Diagnosis Date  . Hyperlipidemia   . Chest pain   . CLL (chronic lymphocytic leukemia)     PAST SURGICAL HISTORY: Past Surgical History  Procedure Laterality Date  . Tubal ligation      FAMILY HISTORY Family History  Problem Relation Age of Onset  . Stroke Mother 44    CVA  . Stroke Maternal Grandmother   . Stroke Maternal Grandfather   . Deep vein thrombosis Brother 45    recurrent DVTs.       ADVANCED DIRECTIVES:    HEALTH MAINTENANCE: History  Substance Use Topics  . Smoking status: Never Smoker   . Smokeless tobacco: Not on file  . Alcohol Use: No     Colonoscopy:  PAP:  Bone density:  Lipid panel:  No Known Allergies  Current Outpatient Prescriptions  Medication Sig Dispense Refill  . allopurinol (ZYLOPRIM) 300 MG tablet Take 300 mg by mouth 2 (two) times daily.    . ondansetron (ZOFRAN-ODT) 8 MG  disintegrating tablet Take 1 tablet (8 mg total) by mouth every 8 (eight) hours as needed for nausea. 20 tablet 1   No current facility-administered medications for this visit.    OBJECTIVE: There were no vitals filed for this visit.   There is no weight on file to calculate BMI.    ECOG FS:0 - Asymptomatic  General: Well-developed, well-nourished, no acute distress. Eyes: anicteric sclera. HEENT: Cervical lymphadenopathy resolved Lungs: Clear to auscultation bilaterally. Heart: Regular rate and rhythm. No rubs, murmurs, or gallops. Abdomen: Soft, nontender, nondistended. No organomegaly noted, normoactive bowel sounds. Musculoskeletal: No edema, cyanosis, or clubbing. Neuro: Alert, answering all questions appropriately. Cranial nerves grossly intact. Skin: No rashes or petechiae noted. Psych: Normal affect.    LAB RESULTS:  Lab Results  Component Value Date   NA 138 12/20/2014   K 4.2 12/20/2014   CL 107 12/20/2014   CO2 25 12/20/2014   GLUCOSE 97 12/20/2014   BUN 15 12/20/2014   CREATININE 0.76 12/20/2014   CALCIUM 8.6* 12/20/2014   PROT 6.9 11/06/2013   ALBUMIN 4.5 11/06/2013   AST 10 11/06/2013   ALT 9 11/06/2013   ALKPHOS 53 11/06/2013   BILITOT 0.4 11/06/2013   GFRNONAA >60 12/20/2014   GFRAA >60 12/20/2014    Lab Results  Component Value Date   WBC 67.3* 12/20/2014   NEUTROABS 5.4 12/20/2014   HGB 12.1 12/20/2014   HCT 38.2 12/20/2014  MCV 89.8 12/20/2014   PLT 294 12/20/2014     STUDIES: No results found.  ASSESSMENT: CLL, Rai stage 1  PLAN:    1.  CLL: Peripheral blood flow cytometry confirmed the results.  CT scan results from November 28, 2014 reviewed independently and noted progression of lymphadenopathy. Her white count is stable. Proceed with cycle 3 of 4 of weekly Rituxan 375 mg/m2. She does not wish to try combination therapy with Treanda at this time. Patient has discontinued allopurinol. Return to clinic in 1 week for consideration of  cycle 4 of 4 of weekly Rituxan. Will consider reimaging 2-3 months after the completion of treatment. 2. Diarrhea: Resolved. Likely secondary to allopurinol.   Patient expressed understanding and was in agreement with this plan. She also understands that She can call clinic at any time with any questions, concerns, or complaints.   No matching staging information was found for the patient.  Lloyd Huger, MD   12/20/2014 4:52 PM

## 2014-12-20 NOTE — Progress Notes (Signed)
Patient had diarrhea while taking the Allopurinol so she stopped the medicine and the diarrhea has subsided.

## 2014-12-24 ENCOUNTER — Other Ambulatory Visit: Payer: Self-pay | Admitting: Oncology

## 2014-12-27 ENCOUNTER — Inpatient Hospital Stay: Payer: BLUE CROSS/BLUE SHIELD

## 2014-12-27 ENCOUNTER — Inpatient Hospital Stay (HOSPITAL_BASED_OUTPATIENT_CLINIC_OR_DEPARTMENT_OTHER): Payer: BLUE CROSS/BLUE SHIELD | Admitting: Oncology

## 2014-12-27 VITALS — BP 115/75 | HR 65 | Temp 95.8°F | Resp 18 | Wt 143.5 lb

## 2014-12-27 DIAGNOSIS — Z79899 Other long term (current) drug therapy: Secondary | ICD-10-CM | POA: Diagnosis not present

## 2014-12-27 DIAGNOSIS — M791 Myalgia: Secondary | ICD-10-CM | POA: Diagnosis not present

## 2014-12-27 DIAGNOSIS — E785 Hyperlipidemia, unspecified: Secondary | ICD-10-CM

## 2014-12-27 DIAGNOSIS — C9110 Chronic lymphocytic leukemia of B-cell type not having achieved remission: Secondary | ICD-10-CM | POA: Diagnosis not present

## 2014-12-27 DIAGNOSIS — Z8673 Personal history of transient ischemic attack (TIA), and cerebral infarction without residual deficits: Secondary | ICD-10-CM

## 2014-12-27 LAB — CBC WITH DIFFERENTIAL/PLATELET
Basophils Absolute: 0.2 10*3/uL — ABNORMAL HIGH (ref 0–0.1)
EOS ABS: 0.2 10*3/uL (ref 0–0.7)
HCT: 37.5 % (ref 35.0–47.0)
Hemoglobin: 12 g/dL (ref 12.0–16.0)
Lymphocytes Relative: 88 %
Lymphs Abs: 45.9 10*3/uL — ABNORMAL HIGH (ref 1.0–3.6)
MCH: 28.4 pg (ref 26.0–34.0)
MCHC: 31.9 g/dL — AB (ref 32.0–36.0)
MCV: 89.2 fL (ref 80.0–100.0)
Monocytes Absolute: 1.3 10*3/uL — ABNORMAL HIGH (ref 0.2–0.9)
Monocytes Relative: 2 %
Neutro Abs: 5 10*3/uL (ref 1.4–6.5)
Neutrophils Relative %: 10 %
PLATELETS: 258 10*3/uL (ref 150–440)
RBC: 4.21 MIL/uL (ref 3.80–5.20)
RDW: 14.4 % (ref 11.5–14.5)
WBC: 52.6 10*3/uL (ref 3.6–11.0)

## 2014-12-27 MED ORDER — ACETAMINOPHEN 325 MG PO TABS
650.0000 mg | ORAL_TABLET | Freq: Once | ORAL | Status: AC
  Administered 2014-12-27: 650 mg via ORAL
  Filled 2014-12-27: qty 2

## 2014-12-27 MED ORDER — SODIUM CHLORIDE 0.9 % IV SOLN
10.0000 mg | Freq: Once | INTRAVENOUS | Status: AC
  Administered 2014-12-27: 10 mg via INTRAVENOUS
  Filled 2014-12-27: qty 1

## 2014-12-27 MED ORDER — SODIUM CHLORIDE 0.9 % IV SOLN: 375.0000 mg/m2 | Freq: Once | INTRAVENOUS | Status: DC

## 2014-12-27 MED ORDER — SODIUM CHLORIDE 0.9 % IV SOLN
375.0000 mg/m2 | Freq: Once | INTRAVENOUS | Status: AC
  Administered 2014-12-27: 700 mg via INTRAVENOUS
  Filled 2014-12-27: qty 20

## 2014-12-27 MED ORDER — DIPHENHYDRAMINE HCL 25 MG PO CAPS
25.0000 mg | ORAL_CAPSULE | Freq: Once | ORAL | Status: AC
  Administered 2014-12-27: 25 mg via ORAL
  Filled 2014-12-27: qty 1

## 2014-12-27 MED ORDER — SODIUM CHLORIDE 0.9 % IV SOLN
Freq: Once | INTRAVENOUS | Status: AC
  Administered 2014-12-27: 12:00:00 via INTRAVENOUS
  Filled 2014-12-27: qty 1000

## 2014-12-27 NOTE — Progress Notes (Signed)
[facility]  Telephone:([phone] [phone]) [phone]  ID: Cathy [name] OB: September 15, 1955  MR#: [id number]  DZH#:[id number]  Patient Care Team: Wardell Honour, MD as PCP - General (Family Medicine)  CHIEF COMPLAINT:  CLL, Rai stage 1  INTERVAL HISTORY: Patient returns to clinic today for further evaluation and consideration of cycle 4 of 4 of weekly Rituxan. She no longer is complaining of B symptoms. She had some achiness and flulike symptoms after her last infusion, but this has since resolved. Since discontinuing her allopurinol, her diarrhea has resolved. She has no other neurologic complaints.  She denies any fevers or weight loss.  She has no chest pain and denies any shortness of breath or cough.  She denies any nausea, vomiting, constipation, or diarrhea.  She has no melena or hematochezia.  She has no urinary complaints.  Patient offers no further specific complaints today.  REVIEW OF SYSTEMS:   Review of Systems  Constitutional: Negative for fever and malaise/fatigue.  Cardiovascular: Negative.   Gastrointestinal: Negative.   Neurological: Negative for weakness.    As per HPI. Otherwise, a complete review of systems is negatve.  PAST MEDICAL HISTORY: Past Medical History  Diagnosis Date  . Hyperlipidemia   . Chest pain   . CLL (chronic lymphocytic leukemia)     PAST SURGICAL HISTORY: Past Surgical History  Procedure Laterality Date  . Tubal ligation      FAMILY HISTORY Family History  Problem Relation Age of Onset  . Stroke Mother 84    CVA  . Stroke Maternal Grandmother   . Stroke Maternal Grandfather   . Deep vein thrombosis Brother 45    recurrent DVTs.       ADVANCED DIRECTIVES:    HEALTH MAINTENANCE: History  Substance Use Topics  . Smoking status: Never Smoker   . Smokeless tobacco: Not on file  . Alcohol Use: No     Colonoscopy:  PAP:  Bone density:  Lipid panel:  No Known Allergies  Current Outpatient  Prescriptions  Medication Sig Dispense Refill  . allopurinol (ZYLOPRIM) 300 MG tablet Take 300 mg by mouth 2 (two) times daily.    . ondansetron (ZOFRAN-ODT) 8 MG disintegrating tablet Take 1 tablet (8 mg total) by mouth every 8 (eight) hours as needed for nausea. 20 tablet 1   No current facility-administered medications for this visit.    OBJECTIVE: Filed Vitals:   12/27/14 1107  BP: 115/75  Pulse: 65  Temp: 95.8 F (35.4 C)  Resp: 18     Body mass index is 22.66 kg/(m^2).    ECOG FS:0 - Asymptomatic  General: Well-developed, well-nourished, no acute distress. Eyes: anicteric sclera. HEENT: Cervical lymphadenopathy resolved Lungs: Clear to auscultation bilaterally. Heart: Regular rate and rhythm. No rubs, murmurs, or gallops. Abdomen: Soft, nontender, nondistended. No organomegaly noted, normoactive bowel sounds. Musculoskeletal: No edema, cyanosis, or clubbing. Neuro: Alert, answering all questions appropriately. Cranial nerves grossly intact. Skin: No rashes or petechiae noted. Psych: Normal affect.    LAB RESULTS:  Lab Results  Component Value Date   NA 138 12/20/2014   K 4.2 12/20/2014   CL 107 12/20/2014   CO2 25 12/20/2014   GLUCOSE 97 12/20/2014   BUN 15 12/20/2014   CREATININE 0.76 12/20/2014   CALCIUM 8.6* 12/20/2014   PROT 6.9 11/06/2013   ALBUMIN 4.5 11/06/2013   AST 10 11/06/2013   ALT 9 11/06/2013   ALKPHOS 53 11/06/2013   BILITOT 0.4 11/06/2013   GFRNONAA >60 12/20/2014  GFRAA >60 12/20/2014    Lab Results  Component Value Date   WBC 52.6* 12/27/2014   NEUTROABS 5.0 12/27/2014   HGB 12.0 12/27/2014   HCT 37.5 12/27/2014   MCV 89.2 12/27/2014   PLT 258 12/27/2014     STUDIES: No results found.  ASSESSMENT: CLL, Rai stage 1  PLAN:    1.  CLL: Peripheral blood flow cytometry confirmed the results.  CT scan results from November 28, 2014 reviewed independently and noted progression of lymphadenopathy. Her white count has trended down  slightly, but still elevated. Proceed with cycle 4 of 4 of weekly Rituxan 375 mg/m2. She does not wish to try combination therapy with Treanda at this time. Patient has discontinued allopurinol. Return to clinic in 6 weeks with repeat imaging, laboratory work, and further evaluation.  2. Diarrhea: Resolved. Likely secondary to allopurinol.  3. Myalgias: Unclear etiology, monitor.  Patient expressed understanding and was in agreement with this plan. She also understands that She can call clinic at any time with any questions, concerns, or complaints.   No matching staging information was found for the patient.  Lloyd Huger, MD   12/27/2014 1:46 PM

## 2014-12-27 NOTE — Progress Notes (Signed)
After treatment last week she started having body aches that seemed to improve daily and still has body aches but not as bad as last week.

## 2015-02-07 ENCOUNTER — Ambulatory Visit
Admission: RE | Admit: 2015-02-07 | Discharge: 2015-02-07 | Disposition: A | Discharge status: Home / Self Care | Payer: BLUE CROSS/BLUE SHIELD | Source: Ambulatory Visit | Attending: Oncology | Admitting: Oncology

## 2015-02-07 DIAGNOSIS — I2510 Atherosclerotic heart disease of native coronary artery without angina pectoris: Secondary | ICD-10-CM | POA: Insufficient documentation

## 2015-02-07 DIAGNOSIS — C9110 Chronic lymphocytic leukemia of B-cell type not having achieved remission: Secondary | ICD-10-CM

## 2015-02-07 MED ORDER — IOHEXOL 350 MG/ML SOLN
100.0000 mL | Freq: Once | INTRAVENOUS | Status: AC | PRN
Start: 2015-02-07 — End: 2015-02-07
  Administered 2015-02-07: 100 mL via INTRAVENOUS

## 2015-02-09 ENCOUNTER — Ambulatory Visit: Payer: BLUE CROSS/BLUE SHIELD

## 2015-02-12 ENCOUNTER — Ambulatory Visit: Payer: BLUE CROSS/BLUE SHIELD | Admitting: Oncology

## 2015-02-12 ENCOUNTER — Other Ambulatory Visit: Payer: BLUE CROSS/BLUE SHIELD

## 2015-02-14 ENCOUNTER — Encounter (INDEPENDENT_AMBULATORY_CARE_PROVIDER_SITE_OTHER): Payer: Self-pay

## 2015-02-14 ENCOUNTER — Inpatient Hospital Stay: Payer: BLUE CROSS/BLUE SHIELD | Attending: Oncology

## 2015-02-14 ENCOUNTER — Inpatient Hospital Stay (HOSPITAL_BASED_OUTPATIENT_CLINIC_OR_DEPARTMENT_OTHER): Payer: BLUE CROSS/BLUE SHIELD | Admitting: Oncology

## 2015-02-14 VITALS — BP 144/73 | HR 73 | Temp 96.5°F | Resp 20 | Wt 145.5 lb

## 2015-02-14 DIAGNOSIS — M5002 Cervical disc disorder with myelopathy, mid-cervical region: Secondary | ICD-10-CM | POA: Insufficient documentation

## 2015-02-14 DIAGNOSIS — E041 Nontoxic single thyroid nodule: Secondary | ICD-10-CM | POA: Insufficient documentation

## 2015-02-14 DIAGNOSIS — Z9221 Personal history of antineoplastic chemotherapy: Secondary | ICD-10-CM

## 2015-02-14 DIAGNOSIS — M791 Myalgia: Secondary | ICD-10-CM | POA: Diagnosis not present

## 2015-02-14 DIAGNOSIS — N281 Cyst of kidney, acquired: Secondary | ICD-10-CM | POA: Insufficient documentation

## 2015-02-14 DIAGNOSIS — I2510 Atherosclerotic heart disease of native coronary artery without angina pectoris: Secondary | ICD-10-CM | POA: Diagnosis not present

## 2015-02-14 DIAGNOSIS — E785 Hyperlipidemia, unspecified: Secondary | ICD-10-CM | POA: Insufficient documentation

## 2015-02-14 DIAGNOSIS — Z79899 Other long term (current) drug therapy: Secondary | ICD-10-CM | POA: Diagnosis not present

## 2015-02-14 DIAGNOSIS — C9110 Chronic lymphocytic leukemia of B-cell type not having achieved remission: Secondary | ICD-10-CM

## 2015-02-14 LAB — CBC WITH DIFFERENTIAL/PLATELET
Basophils Absolute: 0.2 10*3/uL — ABNORMAL HIGH (ref 0–0.1)
Basophils Relative: 1 %
Eosinophils Absolute: 0.1 10*3/uL (ref 0–0.7)
Eosinophils Relative: 0 %
HCT: 39.2 % (ref 35.0–47.0)
Hemoglobin: 12.6 g/dL (ref 12.0–16.0)
Lymphocytes Relative: 91 %
Lymphs Abs: 32.1 10*3/uL — ABNORMAL HIGH (ref 1.0–3.6)
MCH: 28.3 pg (ref 26.0–34.0)
MCHC: 32 g/dL (ref 32.0–36.0)
MCV: 88.5 fL (ref 80.0–100.0)
MONOS PCT: 3 %
Monocytes Absolute: 1 10*3/uL — ABNORMAL HIGH (ref 0.2–0.9)
NEUTROS ABS: 1.8 10*3/uL (ref 1.4–6.5)
NEUTROS PCT: 5 %
Platelets: 268 10*3/uL (ref 150–440)
RBC: 4.43 MIL/uL (ref 3.80–5.20)
RDW: 14.2 % (ref 11.5–14.5)
WBC: 35.3 10*3/uL — AB (ref 3.6–11.0)

## 2015-02-14 LAB — LACTATE DEHYDROGENASE: LDH: 129 U/L (ref 98–192)

## 2015-02-21 NOTE — Progress Notes (Signed)
[facility]  Telephone:(336) [phone] Fax:(336) [phone]  ID: Cathy [name] OB: 06/04/1956  MR#: [id number]  AOZ#:[id number]  Patient Care Team: Wardell Honour, MD as PCP - General (Family Medicine)  CHIEF COMPLAINT:  CLL, Rai stage 1  INTERVAL HISTORY: Patient returns to clinic today for further evaluation and discussion of her imaging results. She currently feels well and is back to her baseline. She denies any B symptoms. She has no neurologic complaints.  She denies any fevers or weight loss.  She has no chest pain and denies any shortness of breath or cough.  She denies any nausea, vomiting, constipation, or diarrhea.  She has no melena or hematochezia.  She has no urinary complaints.  Patient offers no specific complaints today.  REVIEW OF SYSTEMS:   Review of Systems  Constitutional: Negative for fever and malaise/fatigue.  Cardiovascular: Negative.   Gastrointestinal: Negative.   Neurological: Negative for weakness.    As per HPI. Otherwise, a complete review of systems is negatve.  PAST MEDICAL HISTORY: Past Medical History  Diagnosis Date  . Hyperlipidemia   . Chest pain   . CLL (chronic lymphocytic leukemia)     PAST SURGICAL HISTORY: Past Surgical History  Procedure Laterality Date  . Tubal ligation      FAMILY HISTORY Family History  Problem Relation Age of Onset  . Stroke Mother 67    CVA  . Stroke Maternal Grandmother   . Stroke Maternal Grandfather   . Deep vein thrombosis Brother 45    recurrent DVTs.       ADVANCED DIRECTIVES:    HEALTH MAINTENANCE: History  Substance Use Topics  . Smoking status: Never Smoker   . Smokeless tobacco: Not on file  . Alcohol Use: No     Colonoscopy:  PAP:  Bone density:  Lipid panel:  No Known Allergies  Current Outpatient Prescriptions  Medication Sig Dispense Refill  . allopurinol (ZYLOPRIM) 300 MG tablet Take 300 mg by mouth 2 (two) times daily.    . ondansetron  (ZOFRAN-ODT) 8 MG disintegrating tablet Take 1 tablet (8 mg total) by mouth every 8 (eight) hours as needed for nausea. 20 tablet 1   No current facility-administered medications for this visit.    OBJECTIVE: Filed Vitals:   02/14/15 1009  BP: 144/73  Pulse: 73  Temp: 96.5 F (35.8 C)  Resp: 20     Body mass index is 22.97 kg/(m^2).    ECOG FS:0 - Asymptomatic  General: Well-developed, well-nourished, no acute distress. Eyes: anicteric sclera. HEENT: Cervical lymphadenopathy resolved Lungs: Clear to auscultation bilaterally. Heart: Regular rate and rhythm. No rubs, murmurs, or gallops. Abdomen: Soft, nontender, nondistended. No organomegaly noted, normoactive bowel sounds. Musculoskeletal: No edema, cyanosis, or clubbing. Neuro: Alert, answering all questions appropriately. Cranial nerves grossly intact. Skin: No rashes or petechiae noted. Psych: Normal affect.    LAB RESULTS:  Lab Results  Component Value Date   NA 138 12/20/2014   K 4.2 12/20/2014   CL 107 12/20/2014   CO2 25 12/20/2014   GLUCOSE 97 12/20/2014   BUN 15 12/20/2014   CREATININE 0.76 12/20/2014   CALCIUM 8.6* 12/20/2014   PROT 6.9 11/06/2013   ALBUMIN 4.5 11/06/2013   AST 10 11/06/2013   ALT 9 11/06/2013   ALKPHOS 53 11/06/2013   BILITOT 0.4 11/06/2013   GFRNONAA >60 12/20/2014   GFRAA >60 12/20/2014    Lab Results  Component Value Date   WBC 35.3* 02/14/2015   NEUTROABS 1.8 02/14/2015  HGB 12.6 02/14/2015   HCT 39.2 02/14/2015   MCV 88.5 02/14/2015   PLT 268 02/14/2015     STUDIES: Ct Soft Tissue Neck W Contrast  02/07/2015   CLINICAL DATA:  Chronic lymphocytic leukemia restaging. Completed chemotherapy 1 month ago.  EXAM: CT NECK WITH CONTRAST  TECHNIQUE: Multidetector CT imaging of the neck was performed using the standard protocol following the bolus administration of intravenous contrast.  CONTRAST:  126mL OMNIPAQUE IOHEXOL 350 MG/ML SOLN  COMPARISON:  11/28/2014  FINDINGS: Pharynx  and larynx: The nasopharynx is unremarkable. Mild symmetric prominence of the palatine tonsils is unchanged. Larynx is unremarkable.  Salivary glands: Submandibular and parotid glands are unremarkable.  Thyroid: 3.3 cm right thyroid nodule is unchanged. Coarse focal calcification is noted in the left thyroid lobe, also unchanged.  Lymph nodes: Enlarged lymph nodes throughout the neck on the prior study have all significantly decreased in size and now are all subcentimeter in short axis. Submental lymph nodes measure up to 8 mm in short axis (previously 13 mm. Level IB lymph nodes measure up to 7 mm in short axis (previously 15 mm). Level II lymph nodes measure up to 9 mm on the right and 7 mm on the left in short axis (previously 16 and 12 mm, respectively). Right level IV and V lymph nodes measure up to 5 mm in short axis (previously 10-11 mm). Left level V lymph nodes measure up to 6 mm (previously 9 mm).  Vascular: Major vascular structures of the neck appear patent.  Limited intracranial: The visualized portion of the brain is unremarkable.  Visualized orbits: Unremarkable.  Mastoids and visualized paranasal sinuses: Clear.  Skeleton: Mild-to-moderate disc degeneration at C5-6 and C6-7. No destructive osseous lesions identified.  Upper chest: Evaluated on concurrent dedicated chest CT.  IMPRESSION: Evidence of positive response to therapy with decreased size of bilateral cervical lymph nodes, now all subcentimeter in short axis.   Electronically Signed   By: Logan Bores   On: 02/07/2015 11:43   Ct Chest W Contrast  02/07/2015   CLINICAL DATA:  59 year old female with history of chronic lymphocytic leukemia. Chemotherapy completed 1 month ago. Restaging examination.  EXAM: CT CHEST, ABDOMEN, AND PELVIS WITH CONTRAST  TECHNIQUE: Multidetector CT imaging of the chest, abdomen and pelvis was performed following the standard protocol during bolus administration of intravenous contrast.  CONTRAST:  151mL OMNIPAQUE  IOHEXOL 350 MG/ML SOLN  COMPARISON:  CT of the chest, abdomen and pelvis 11/28/2014.  FINDINGS: CT CHEST FINDINGS  Mediastinum/Lymph Nodes: Heart size is normal. There is no significant pericardial fluid, thickening or pericardial calcification. There is atherosclerosis of the thoracic aorta, the great vessels of the mediastinum and the coronary arteries, including calcified atherosclerotic plaque in the left main, left anterior descending and left circumflex coronary arteries. No pathologically enlarged mediastinal or hilar lymph nodes. Esophagus is unremarkable in appearance. Multiple borderline enlarged bilateral axillary lymph nodes measuring up to 8 mm in short axis superior decreased in size compared to the prior examination from 11/28/2014.  Lungs/Pleura: 4 mm subpleural nodule in the periphery of the lateral segment of the right middle lobe) image 36 of series 6) and 3 mm subpleural nodule in the periphery of the right lower lobe (Image 48 of series 6) are both unchanged compared to prior examination 01/17/2014, now considered benign subpleural lymph nodes. No other suspicious appearing pulmonary nodules or masses. No acute consolidative airspace disease. No pleural effusions.  Musculoskeletal/Soft Tissues: There are no aggressive appearing lytic or blastic  lesions noted in the visualized portions of the skeleton.  CT ABDOMEN AND PELVIS FINDINGS  Hepatobiliary: Sub cm low-attenuation lesion in segment 8 of the liver is too small to characterize, but is unchanged compared to prior examinations, favored to represent a tiny cyst. No other suspicious appearing cystic or solid hepatic lesions are noted. No intra or extrahepatic biliary ductal dilatation. Gallbladder is normal in appearance.  Pancreas: No pancreatic mass. No pancreatic ductal dilatation. No pancreatic or peripancreatic fluid or inflammatory changes.  Spleen: Unremarkable.  8 cm in length.  Adrenals/Urinary Tract: Sub cm low-attenuation lesion in  the upper pole of the right kidney is too small characterize, but is unchanged compared to prior studies, likely a tiny cyst. 2.6 x 2.5 cm simple cyst in the upper pole of the left kidney. No hydroureteronephrosis. Bilateral adrenal glands are normal in appearance. Urinary bladder is normal in appearance.  Stomach/Bowel: The appearance of the stomach is normal. No pathologic dilatation of small bowel or colon.  Vascular/Lymphatic: Atherosclerosis throughout the abdominal and pelvic vasculature, without evidence of aneurysm or dissection. Numerous borderline enlarged and enlarged retroperitoneal, upper abdominal and pelvic lymph nodes are again noted, however, these have generally decreased compared to the prior examination. Specific examples include a 2.2 cm short axis portacaval lymph node (previously 3.7 cm), a left para-aortic lymph node measuring 12 mm in short axis (previously 28 mm), and right inguinal lymph node measuring 1 cm in short axis (previously 1.4 cm).  Reproductive: 2.7 cm low-attenuation lesion in the left adnexa has a benign appearance. Uterus and right ovary are normal in appearance.  Other: No significant volume of ascites.  No pneumoperitoneum.  Musculoskeletal: There are no aggressive appearing lytic or blastic lesions noted in the visualized portions of the skeleton.  IMPRESSION: 1. Today's study demonstrates a positive response to therapy with generalized decrease in size of all previous lymphadenopathy in the axillary regions, upper abdomen, retroperitoneum and pelvis, as detailed above. No new sites of disease are noted. 2. Spleen is normal in size. 3. Atherosclerosis, including left main and 2 vessel coronary artery disease. Please note that although the presence of coronary artery calcium documents the presence of coronary artery disease, the severity of this disease and any potential stenosis cannot be assessed on this non-gated CT examination. Assessment for potential risk factor  modification, dietary therapy or pharmacologic therapy may be warranted, if clinically indicated.   Electronically Signed   By: Vinnie Langton M.D.   On: 02/07/2015 12:58   Ct Abdomen Pelvis W Contrast  02/07/2015   CLINICAL DATA:  59 year old female with history of chronic lymphocytic leukemia. Chemotherapy completed 1 month ago. Restaging examination.  EXAM: CT CHEST, ABDOMEN, AND PELVIS WITH CONTRAST  TECHNIQUE: Multidetector CT imaging of the chest, abdomen and pelvis was performed following the standard protocol during bolus administration of intravenous contrast.  CONTRAST:  149mL OMNIPAQUE IOHEXOL 350 MG/ML SOLN  COMPARISON:  CT of the chest, abdomen and pelvis 11/28/2014.  FINDINGS: CT CHEST FINDINGS  Mediastinum/Lymph Nodes: Heart size is normal. There is no significant pericardial fluid, thickening or pericardial calcification. There is atherosclerosis of the thoracic aorta, the great vessels of the mediastinum and the coronary arteries, including calcified atherosclerotic plaque in the left main, left anterior descending and left circumflex coronary arteries. No pathologically enlarged mediastinal or hilar lymph nodes. Esophagus is unremarkable in appearance. Multiple borderline enlarged bilateral axillary lymph nodes measuring up to 8 mm in short axis superior decreased in size compared to the prior examination  from 11/28/2014.  Lungs/Pleura: 4 mm subpleural nodule in the periphery of the lateral segment of the right middle lobe) image 36 of series 6) and 3 mm subpleural nodule in the periphery of the right lower lobe (Image 48 of series 6) are both unchanged compared to prior examination 01/17/2014, now considered benign subpleural lymph nodes. No other suspicious appearing pulmonary nodules or masses. No acute consolidative airspace disease. No pleural effusions.  Musculoskeletal/Soft Tissues: There are no aggressive appearing lytic or blastic lesions noted in the visualized portions of the  skeleton.  CT ABDOMEN AND PELVIS FINDINGS  Hepatobiliary: Sub cm low-attenuation lesion in segment 8 of the liver is too small to characterize, but is unchanged compared to prior examinations, favored to represent a tiny cyst. No other suspicious appearing cystic or solid hepatic lesions are noted. No intra or extrahepatic biliary ductal dilatation. Gallbladder is normal in appearance.  Pancreas: No pancreatic mass. No pancreatic ductal dilatation. No pancreatic or peripancreatic fluid or inflammatory changes.  Spleen: Unremarkable.  8 cm in length.  Adrenals/Urinary Tract: Sub cm low-attenuation lesion in the upper pole of the right kidney is too small characterize, but is unchanged compared to prior studies, likely a tiny cyst. 2.6 x 2.5 cm simple cyst in the upper pole of the left kidney. No hydroureteronephrosis. Bilateral adrenal glands are normal in appearance. Urinary bladder is normal in appearance.  Stomach/Bowel: The appearance of the stomach is normal. No pathologic dilatation of small bowel or colon.  Vascular/Lymphatic: Atherosclerosis throughout the abdominal and pelvic vasculature, without evidence of aneurysm or dissection. Numerous borderline enlarged and enlarged retroperitoneal, upper abdominal and pelvic lymph nodes are again noted, however, these have generally decreased compared to the prior examination. Specific examples include a 2.2 cm short axis portacaval lymph node (previously 3.7 cm), a left para-aortic lymph node measuring 12 mm in short axis (previously 28 mm), and right inguinal lymph node measuring 1 cm in short axis (previously 1.4 cm).  Reproductive: 2.7 cm low-attenuation lesion in the left adnexa has a benign appearance. Uterus and right ovary are normal in appearance.  Other: No significant volume of ascites.  No pneumoperitoneum.  Musculoskeletal: There are no aggressive appearing lytic or blastic lesions noted in the visualized portions of the skeleton.  IMPRESSION: 1. Today's  study demonstrates a positive response to therapy with generalized decrease in size of all previous lymphadenopathy in the axillary regions, upper abdomen, retroperitoneum and pelvis, as detailed above. No new sites of disease are noted. 2. Spleen is normal in size. 3. Atherosclerosis, including left main and 2 vessel coronary artery disease. Please note that although the presence of coronary artery calcium documents the presence of coronary artery disease, the severity of this disease and any potential stenosis cannot be assessed on this non-gated CT examination. Assessment for potential risk factor modification, dietary therapy or pharmacologic therapy may be warranted, if clinically indicated.   Electronically Signed   By: Vinnie Langton M.D.   On: 02/07/2015 12:58    ASSESSMENT: CLL, Rai stage 1  PLAN:    1.  CLL: Peripheral blood flow cytometry confirmed the results.  Patient's white blood cell count is elevated, but stable. She has no other cytopenias. CT scan results reviewed independently and reported as above with significant improvement of her lymphadenopathy. Patient completed 4 cycles of weekly Rituxan in May 2016. She does not require any further treatment at this time, but I suspect she will require treatment in the future possibly with Rituxan and Treanda.  Return to clinic in 3 months with laboratory work and further evaluation. Will consider reimaging in 6 months if indicated. 2. Diarrhea: Resolved. Likely secondary to allopurinol.  3. Myalgias: Unclear etiology, monitor.  Patient expressed understanding and was in agreement with this plan. She also understands that She can call clinic at any time with any questions, concerns, or complaints.    Lloyd Huger, MD   02/21/2015 1:57 PM

## 2015-04-27 ENCOUNTER — Other Ambulatory Visit: Payer: Self-pay | Admitting: Orthopedic Surgery

## 2015-04-27 DIAGNOSIS — M25512 Pain in left shoulder: Secondary | ICD-10-CM

## 2015-05-04 ENCOUNTER — Other Ambulatory Visit: Payer: BLUE CROSS/BLUE SHIELD

## 2015-05-09 ENCOUNTER — Ambulatory Visit
Admission: RE | Admit: 2015-05-09 | Discharge: 2015-05-09 | Disposition: A | Discharge status: Home / Self Care | Payer: BLUE CROSS/BLUE SHIELD | Source: Ambulatory Visit | Attending: Orthopedic Surgery | Admitting: Orthopedic Surgery

## 2015-05-09 DIAGNOSIS — M7552 Bursitis of left shoulder: Secondary | ICD-10-CM | POA: Diagnosis not present

## 2015-05-09 DIAGNOSIS — R590 Localized enlarged lymph nodes: Secondary | ICD-10-CM | POA: Diagnosis not present

## 2015-05-09 DIAGNOSIS — M25512 Pain in left shoulder: Secondary | ICD-10-CM | POA: Diagnosis present

## 2015-05-09 MED ORDER — GADOBENATE DIMEGLUMINE 529 MG/ML IV SOLN: 0.1000 mL | Freq: Once | INTRAVENOUS | Status: DC | PRN

## 2015-05-09 MED ORDER — IOHEXOL 180 MG/ML  SOLN: 5.0000 mL | Freq: Once | INTRAMUSCULAR | Status: DC | PRN

## 2015-05-16 ENCOUNTER — Inpatient Hospital Stay: Payer: BLUE CROSS/BLUE SHIELD | Attending: Oncology

## 2015-05-16 ENCOUNTER — Inpatient Hospital Stay (HOSPITAL_BASED_OUTPATIENT_CLINIC_OR_DEPARTMENT_OTHER): Payer: BLUE CROSS/BLUE SHIELD | Admitting: Oncology

## 2015-05-16 VITALS — BP 122/72 | HR 77 | Temp 97.7°F | Resp 16 | Wt 142.6 lb

## 2015-05-16 DIAGNOSIS — C9110 Chronic lymphocytic leukemia of B-cell type not having achieved remission: Secondary | ICD-10-CM | POA: Insufficient documentation

## 2015-05-16 DIAGNOSIS — R61 Generalized hyperhidrosis: Secondary | ICD-10-CM

## 2015-05-16 DIAGNOSIS — R509 Fever, unspecified: Secondary | ICD-10-CM | POA: Diagnosis not present

## 2015-05-16 DIAGNOSIS — E785 Hyperlipidemia, unspecified: Secondary | ICD-10-CM

## 2015-05-16 DIAGNOSIS — R531 Weakness: Secondary | ICD-10-CM | POA: Diagnosis not present

## 2015-05-16 DIAGNOSIS — M25512 Pain in left shoulder: Secondary | ICD-10-CM | POA: Diagnosis not present

## 2015-05-16 LAB — CBC WITH DIFFERENTIAL/PLATELET
BASOS ABS: 0.1 10*3/uL (ref 0–0.1)
BASOS PCT: 0 %
Eosinophils Absolute: 0.1 10*3/uL (ref 0–0.7)
Eosinophils Relative: 0 %
HEMATOCRIT: 39.5 % (ref 35.0–47.0)
HEMOGLOBIN: 12.7 g/dL (ref 12.0–16.0)
LYMPHS PCT: 84 %
Lymphs Abs: 41.3 10*3/uL — ABNORMAL HIGH (ref 1.0–3.6)
MCH: 28.4 pg (ref 26.0–34.0)
MCHC: 32.3 g/dL (ref 32.0–36.0)
MCV: 88.1 fL (ref 80.0–100.0)
MONO ABS: 1.2 10*3/uL — AB (ref 0.2–0.9)
Monocytes Relative: 2 %
NEUTROS PCT: 14 %
Neutro Abs: 6.7 10*3/uL — ABNORMAL HIGH (ref 1.4–6.5)
Platelets: 329 10*3/uL (ref 150–440)
RBC: 4.48 MIL/uL (ref 3.80–5.20)
RDW: 15.3 % — AB (ref 11.5–14.5)
WBC: 49.4 10*3/uL — AB (ref 3.6–11.0)

## 2015-05-16 LAB — LACTATE DEHYDROGENASE: LDH: 151 U/L (ref 98–192)

## 2015-05-27 NOTE — Progress Notes (Signed)
C[facility] [name]  Telephone:(336) [phone] Fax:(336) [phone]  ID: Cathy [name] OB: January 24, 1956  MR#: [id number]  JQB#:[id number]  Patient Care Team: Wardell Honour, MD as PCP - General (Family Medicine)  CHIEF COMPLAINT:  CLL, Rai stage 1  INTERVAL HISTORY: Patient returns to clinic today for further evaluation and laboratory work. She currently feels well and is asymptomatic. She has occasional night sweats and fevers, but denies any persistent B symptoms. She has no neurologic complaints.  She denies any weight loss.  She has no chest pain and denies any shortness of breath or cough.  She denies any nausea, vomiting, constipation, or diarrhea.  She has no melena or hematochezia.  She has no urinary complaints.  Patient offers no further specific complaints today.  REVIEW OF SYSTEMS:   Review of Systems  Constitutional: Positive for fever. Negative for malaise/fatigue.  Respiratory: Negative.   Cardiovascular: Negative.   Gastrointestinal: Negative.   Neurological: Positive for sensory change. Negative for weakness.    As per HPI. Otherwise, a complete review of systems is negatve.  PAST MEDICAL HISTORY: Past Medical History  Diagnosis Date  . Hyperlipidemia   . Chest pain   . CLL (chronic lymphocytic leukemia)     PAST SURGICAL HISTORY: Past Surgical History  Procedure Laterality Date  . Tubal ligation      FAMILY HISTORY Family History  Problem Relation Age of Onset  . Stroke Mother 60    CVA  . Stroke Maternal Grandmother   . Stroke Maternal Grandfather   . Deep vein thrombosis Brother 45    recurrent DVTs.       ADVANCED DIRECTIVES:    HEALTH MAINTENANCE: Social History  Substance Use Topics  . Smoking status: Never Smoker   . Smokeless tobacco: Not on file  . Alcohol Use: No     Colonoscopy:  PAP:  Bone density:  Lipid panel:  No Known Allergies  No current outpatient prescriptions on file.   No current  facility-administered medications for this visit.    OBJECTIVE: Filed Vitals:   05/16/15 1625  BP: 122/72  Pulse: 77  Temp: 97.7 F (36.5 C)  Resp: 16     Body mass index is 22.52 kg/(m^2).    ECOG FS:0 - Asymptomatic  General: Well-developed, well-nourished, no acute distress. Eyes: anicteric sclera. HEENT: Cervical lymphadenopathy resolved Lungs: Clear to auscultation bilaterally. Heart: Regular rate and rhythm. No rubs, murmurs, or gallops. Abdomen: Soft, nontender, nondistended. No organomegaly noted, normoactive bowel sounds. Musculoskeletal: No edema, cyanosis, or clubbing. Neuro: Alert, answering all questions appropriately. Cranial nerves grossly intact. Skin: No rashes or petechiae noted. Psych: Normal affect.    LAB RESULTS:  Lab Results  Component Value Date   NA 138 12/20/2014   K 4.2 12/20/2014   CL 107 12/20/2014   CO2 25 12/20/2014   GLUCOSE 97 12/20/2014   BUN 15 12/20/2014   CREATININE 0.76 12/20/2014   CALCIUM 8.6* 12/20/2014   PROT 6.9 11/06/2013   ALBUMIN 4.5 11/06/2013   AST 10 11/06/2013   ALT 9 11/06/2013   ALKPHOS 53 11/06/2013   BILITOT 0.4 11/06/2013   GFRNONAA >60 12/20/2014   GFRAA >60 12/20/2014    Lab Results  Component Value Date   WBC 49.4* 05/16/2015   NEUTROABS 6.7* 05/16/2015   HGB 12.7 05/16/2015   HCT 39.5 05/16/2015   MCV 88.1 05/16/2015   PLT 329 05/16/2015     STUDIES: Dg Arthro Shoulder Left  05/09/2015   CLINICAL DATA:  Left shoulder pain after carrying heavy load.  EXAM: ARTHROGRAM OF THE LEFT SHOULDER  FLUOROSCOPY TIME:  Radiation Exposure Index (as provided by the fluoroscopic device): 5.3 mGy  TECHNIQUE: After discussing the risks and benefits of this procedure the patient informed consent was obtained. Left shoulder sterilely prepped and draped. Following local anesthesia with 1% lidocaine a 22 gauge spinal needle was advanced into the left shoulder joint under fluoroscopic guidance. Standardized mixture of  saline, lidocaine, and Magnevist administered. No complications.  COMPARISON:  None.  FINDINGS: Successful left shoulder joint injection for left shoulder MRI arthrogram. No complications.  IMPRESSION: Successful left shoulder joint injection for left shoulder MRI arthrogram.   Electronically Signed   By: Marcello Moores  Register   On: 05/09/2015 14:06   Mr Shoulder Left W Contrast  05/09/2015   CLINICAL DATA:  Decreased range of motion. Left shoulder pain and weakness. Not improving.  EXAM: MR ARTHROGRAM OF THE LEFT SHOULDER  TECHNIQUE: Multiplanar, multisequence MR imaging of the left shoulder was performed following the administration of intra-articular contrast.  CONTRAST:  See Injection Documentation.  COMPARISON:  None.  FINDINGS: Rotator cuff: Supraspinatus tendon is intact. Infraspinatus tendon is intact. Teres minor tendon is intact. Subscapularis tendon is intact.  Muscles: No atrophy or fatty replacement of nor abnormal signal within, the muscles of the rotator cuff.  Biceps long head: Intact.  Acromioclavicular Joint: Normal acromioclavicular joint. Type II acromion. Small amount subacromial/subdeltoid bursal fluid.  Glenohumeral Joint: No chondral defect. No dislocation. Normal glenohumeral ligaments.  Labrum: Intact.  Bones: No marrow signal abnormality.  No fracture or dislocation.  Soft tissue: No fluid collection or hematoma. Left axillary lymphadenopathy partially visualized with the largest lymph node measuring 15 mm in short axis.  IMPRESSION: 1. No internal derangement of the left shoulder. 2. Mild subacromial/subdeltoid bursitis. 3. Nonspecific left axillary lymphadenopathy. Given the patient's prior history of CLL, this may reflect recurrent disease. Recommend correlation with laboratory values and a CT of the chest with intravenous contrast.   Electronically Signed   By: Kathreen Devoid   On: 05/09/2015 13:43    ASSESSMENT: CLL, Rai stage 1  PLAN:    1.  CLL: Peripheral blood flow cytometry  confirmed the results.  Patient's white blood cell count is trending back up, but she has no other cytopenias. She is having occasional B symptoms. CT scan results from February 07, 2015 reviewed independently with significant improvement of her lymphadenopathy. Patient completed 4 cycles of weekly Rituxan in May 2016. She does not require any further treatment at this time, but I suspect she will require treatment in the future possibly with Rituxan and Treanda or FCR.  Return to clinic in 3 months with laboratory work and further evaluation. Patient also has requested to delay further imaging at least 2017 secondary to financial concerns.   Patient expressed understanding and was in agreement with this plan. She also understands that She can call clinic at any time with any questions, concerns, or complaints.    Lloyd Huger, MD   05/27/2015 12:09 PM

## 2015-08-16 ENCOUNTER — Other Ambulatory Visit: Payer: Self-pay | Admitting: Nurse Practitioner

## 2015-08-16 ENCOUNTER — Inpatient Hospital Stay: Payer: BLUE CROSS/BLUE SHIELD

## 2015-08-16 ENCOUNTER — Inpatient Hospital Stay: Payer: BLUE CROSS/BLUE SHIELD | Admitting: Oncology

## 2015-08-27 ENCOUNTER — Inpatient Hospital Stay: Payer: BLUE CROSS/BLUE SHIELD

## 2015-08-27 ENCOUNTER — Inpatient Hospital Stay: Payer: BLUE CROSS/BLUE SHIELD | Admitting: Oncology

## 2015-09-06 ENCOUNTER — Encounter (INDEPENDENT_AMBULATORY_CARE_PROVIDER_SITE_OTHER): Payer: Self-pay

## 2015-09-06 ENCOUNTER — Inpatient Hospital Stay: Payer: Managed Care, Other (non HMO) | Attending: Oncology

## 2015-09-06 ENCOUNTER — Inpatient Hospital Stay (HOSPITAL_BASED_OUTPATIENT_CLINIC_OR_DEPARTMENT_OTHER): Payer: Managed Care, Other (non HMO) | Admitting: Oncology

## 2015-09-06 ENCOUNTER — Encounter: Payer: Self-pay | Admitting: Oncology

## 2015-09-06 VITALS — BP 132/74 | HR 80 | Temp 98.4°F | Resp 18 | Ht 66.0 in | Wt 145.3 lb

## 2015-09-06 DIAGNOSIS — E785 Hyperlipidemia, unspecified: Secondary | ICD-10-CM | POA: Insufficient documentation

## 2015-09-06 DIAGNOSIS — Z823 Family history of stroke: Secondary | ICD-10-CM | POA: Diagnosis not present

## 2015-09-06 DIAGNOSIS — C9110 Chronic lymphocytic leukemia of B-cell type not having achieved remission: Secondary | ICD-10-CM | POA: Diagnosis not present

## 2015-09-06 LAB — CBC WITH DIFFERENTIAL/PLATELET
Basophils Absolute: 0.2 10*3/uL — ABNORMAL HIGH (ref 0–0.1)
Eosinophils Absolute: 0.2 10*3/uL (ref 0–0.7)
Eosinophils Relative: 0 %
HEMATOCRIT: 40 % (ref 35.0–47.0)
Hemoglobin: 12.6 g/dL (ref 12.0–16.0)
Lymphocytes Relative: 90 %
Lymphs Abs: 77.6 10*3/uL — ABNORMAL HIGH (ref 1.0–3.6)
MCH: 28 pg (ref 26.0–34.0)
MCHC: 31.6 g/dL — ABNORMAL LOW (ref 32.0–36.0)
MCV: 88.6 fL (ref 80.0–100.0)
MONO ABS: 1.5 10*3/uL — AB (ref 0.2–0.9)
Monocytes Relative: 2 %
Neutro Abs: 6.9 10*3/uL — ABNORMAL HIGH (ref 1.4–6.5)
Platelets: 302 10*3/uL (ref 150–440)
RBC: 4.51 MIL/uL (ref 3.80–5.20)
RDW: 14.5 % (ref 11.5–14.5)
WBC: 86.4 10*3/uL (ref 3.6–11.0)

## 2015-09-12 NOTE — Progress Notes (Signed)
[place]  Telephone:(336) [phone] Fax:(336) [phone]  ID: Franne Forts OB: [birth year]  MR#: [id number]  [id number]  Patient Care Team: Wardell Honour, MD as PCP - General (Family Medicine)  CHIEF COMPLAINT:  CLL, Rai stage 1  INTERVAL HISTORY: Patient returns to clinic today for further evaluation and laboratory work. She continues to feel well and is asymptomatic. She denies any night sweats, fevers, or other B symptoms. She has no neurologic complaints.  She denies any weight loss.  She has no chest pain and denies any shortness of breath or cough.  She denies any nausea, vomiting, constipation, or diarrhea.  She has no melena or hematochezia.  She has no urinary complaints.  Patient offers no further specific complaints today.  REVIEW OF SYSTEMS:   Review of Systems  Constitutional: Negative for fever, weight loss and malaise/fatigue.  Respiratory: Negative.   Cardiovascular: Negative.   Gastrointestinal: Negative.   Neurological: Negative for sensory change and weakness.    As per HPI. Otherwise, a complete review of systems is negatve.  PAST MEDICAL HISTORY: Past Medical History  Diagnosis Date  . Hyperlipidemia   . Chest pain   . CLL (chronic lymphocytic leukemia) ([place])     PAST SURGICAL HISTORY: Past Surgical History  Procedure Laterality Date  . Tubal ligation      FAMILY HISTORY Family History  Problem Relation Age of Onset  . Stroke Mother 10    CVA  . Stroke Maternal Grandmother   . Stroke Maternal Grandfather   . Deep vein thrombosis Brother 45    recurrent DVTs.       ADVANCED DIRECTIVES:    HEALTH MAINTENANCE: Social History  Substance Use Topics  . Smoking status: Never Smoker   . Smokeless tobacco: None  . Alcohol Use: No     Colonoscopy:  PAP:  Bone density:  Lipid panel:  No Known Allergies  No current outpatient prescriptions on file.   No current facility-administered medications for this  visit.    OBJECTIVE: Filed Vitals:   09/06/15 1411  BP: 132/74  Pulse: 80  Temp: 98.4 F (36.9 C)  Resp: 18     Body mass index is 23.46 kg/(m^2).    ECOG FS:0 - Asymptomatic  General: Well-developed, well-nourished, no acute distress. Eyes: anicteric sclera. HEENT: Cervical lymphadenopathy resolved Lungs: Clear to auscultation bilaterally. Heart: Regular rate and rhythm. No rubs, murmurs, or gallops. Abdomen: Soft, nontender, nondistended. No organomegaly noted, normoactive bowel sounds. Musculoskeletal: No edema, cyanosis, or clubbing. Neuro: Alert, answering all questions appropriately. Cranial nerves grossly intact. Skin: No rashes or petechiae noted. Psych: Normal affect.    LAB RESULTS:  Lab Results  Component Value Date   NA 138 12/20/2014   K 4.2 12/20/2014   CL 107 12/20/2014   CO2 25 12/20/2014   GLUCOSE 97 12/20/2014   BUN 15 12/20/2014   CREATININE 0.76 12/20/2014   CALCIUM 8.6* 12/20/2014   PROT 6.9 11/06/2013   ALBUMIN 4.5 11/06/2013   AST 10 11/06/2013   ALT 9 11/06/2013   ALKPHOS 53 11/06/2013   BILITOT 0.4 11/06/2013   GFRNONAA >60 12/20/2014   GFRAA >60 12/20/2014    Lab Results  Component Value Date   WBC 86.4* 09/06/2015   NEUTROABS 6.9* 09/06/2015   HGB 12.6 09/06/2015   HCT 40.0 09/06/2015   MCV 88.6 09/06/2015   PLT 302 09/06/2015     STUDIES: No results found.  ASSESSMENT: CLL, Rai stage 1  PLAN:  1.  CLL: Peripheral blood flow cytometry confirmed the results.  Patient's white blood cell count is trending back up, but she has no other cytopenias. She denies any B symptoms. CT scan results from February 07, 2015 reviewed independently with significant improvement of her lymphadenopathy. Patient completed 4 cycles of weekly Rituxan in May 2016. She does not require any further treatment at this time, but I suspect she will require treatment in the future possibly with Rituxan and Treanda or FCR.  Return to clinic in 3 months with  laboratory work and further evaluation. Patient also has requested to delay further imaging until she has significant progression of disease and treatment is necessary.    Patient expressed understanding and was in agreement with this plan. She also understands that She can call clinic at any time with any questions, concerns, or complaints.    Lloyd Huger, MD   09/12/2015 5:24 AM

## 2015-12-06 ENCOUNTER — Ambulatory Visit: Payer: BLUE CROSS/BLUE SHIELD | Admitting: Oncology

## 2015-12-06 ENCOUNTER — Other Ambulatory Visit: Payer: BLUE CROSS/BLUE SHIELD

## 2015-12-10 ENCOUNTER — Other Ambulatory Visit: Payer: Self-pay | Admitting: *Deleted

## 2015-12-10 DIAGNOSIS — C9110 Chronic lymphocytic leukemia of B-cell type not having achieved remission: Secondary | ICD-10-CM

## 2015-12-11 ENCOUNTER — Inpatient Hospital Stay (HOSPITAL_BASED_OUTPATIENT_CLINIC_OR_DEPARTMENT_OTHER): Payer: Managed Care, Other (non HMO) | Admitting: Oncology

## 2015-12-11 ENCOUNTER — Inpatient Hospital Stay: Payer: Managed Care, Other (non HMO) | Attending: Oncology

## 2015-12-11 VITALS — BP 120/71 | HR 77 | Temp 97.4°F | Resp 16 | Wt 144.8 lb

## 2015-12-11 DIAGNOSIS — Z9221 Personal history of antineoplastic chemotherapy: Secondary | ICD-10-CM | POA: Diagnosis not present

## 2015-12-11 DIAGNOSIS — C9110 Chronic lymphocytic leukemia of B-cell type not having achieved remission: Secondary | ICD-10-CM | POA: Diagnosis not present

## 2015-12-11 DIAGNOSIS — E785 Hyperlipidemia, unspecified: Secondary | ICD-10-CM | POA: Diagnosis not present

## 2015-12-11 DIAGNOSIS — R61 Generalized hyperhidrosis: Secondary | ICD-10-CM

## 2015-12-11 LAB — CBC WITH DIFFERENTIAL/PLATELET
BASOS PCT: 0 %
Basophils Absolute: 0 10*3/uL (ref 0–0.1)
Eosinophils Absolute: 0 10*3/uL (ref 0–0.7)
Eosinophils Relative: 0 %
HCT: 39.7 % (ref 35.0–47.0)
HEMOGLOBIN: 12.5 g/dL (ref 12.0–16.0)
LYMPHS PCT: 96 %
Lymphs Abs: 121.4 10*3/uL — ABNORMAL HIGH (ref 1.0–3.6)
MCH: 28.7 pg (ref 26.0–34.0)
MCHC: 31.5 g/dL — ABNORMAL LOW (ref 32.0–36.0)
MCV: 91.2 fL (ref 80.0–100.0)
MONOS PCT: 1 %
Monocytes Absolute: 1.3 10*3/uL — ABNORMAL HIGH (ref 0.2–0.9)
NEUTROS ABS: 3.8 10*3/uL (ref 1.4–6.5)
Neutrophils Relative %: 3 %
Platelets: 255 10*3/uL (ref 150–440)
RBC: 4.35 MIL/uL (ref 3.80–5.20)
RDW: 15 % — ABNORMAL HIGH (ref 11.5–14.5)
WBC: 126.5 10*3/uL (ref 3.6–11.0)

## 2015-12-11 LAB — LACTATE DEHYDROGENASE: LDH: 164 U/L (ref 98–192)

## 2015-12-11 NOTE — Progress Notes (Signed)
Patient does not offer any problems today.  

## 2015-12-17 ENCOUNTER — Telehealth: Payer: Self-pay | Admitting: Oncology

## 2015-12-17 NOTE — Progress Notes (Signed)
[place]  Telephone:(336) [phone] [phone]) [phone]  ID: Cathy [name] OB: 1956-05-31  MR#: [id number]  [id number]  Patient Care Team: Wardell Honour, MD as PCP - General (Family Medicine)  CHIEF COMPLAINT:  CLL, Rai stage 1  INTERVAL HISTORY: Patient returns to clinic today for further evaluation and laboratory work. She continues to feel well and is asymptomatic. She admits to occasional night sweats, but denies any fevers or other B symptoms. She has no neurologic complaints.  She denies any weight loss.  She has no chest pain and denies any shortness of breath or cough.  She denies any nausea, vomiting, constipation, or diarrhea.  She has no melena or hematochezia.  She has no urinary complaints.  Patient offers no further specific complaints today.  REVIEW OF SYSTEMS:   Review of Systems  Constitutional: Positive for diaphoresis. Negative for fever, weight loss and malaise/fatigue.  Respiratory: Negative.   Cardiovascular: Negative.   Gastrointestinal: Negative.   Musculoskeletal: Negative.   Neurological: Negative for sensory change and weakness.  Psychiatric/Behavioral: Negative.     As per HPI. Otherwise, a complete review of systems is negatve.  PAST MEDICAL HISTORY: Past Medical History  Diagnosis Date  . Hyperlipidemia   . Chest pain   . CLL (chronic lymphocytic leukemia) ([place])     PAST SURGICAL HISTORY: Past Surgical History  Procedure Laterality Date  . Tubal ligation      FAMILY HISTORY Family History  Problem Relation Age of Onset  . Stroke Mother 73    CVA  . Stroke Maternal Grandmother   . Stroke Maternal Grandfather   . Deep vein thrombosis Brother 45    recurrent DVTs.       ADVANCED DIRECTIVES:    HEALTH MAINTENANCE: Social History  Substance Use Topics  . Smoking status: Never Smoker   . Smokeless tobacco: Not on file  . Alcohol Use: No     Colonoscopy:  PAP:  Bone density:  Lipid panel:  No  Known Allergies  No current outpatient prescriptions on file.   No current facility-administered medications for this visit.    OBJECTIVE: Filed Vitals:   12/11/15 1500  BP: 120/71  Pulse: 77  Temp: 97.4 F (36.3 C)  Resp: 16     Body mass index is 23.39 kg/(m^2).    ECOG FS:0 - Asymptomatic  General: Well-developed, well-nourished, no acute distress. Eyes: anicteric sclera. HEENT: Cervical lymphadenopathy resolved Lungs: Clear to auscultation bilaterally. Heart: Regular rate and rhythm. No rubs, murmurs, or gallops. Abdomen: Soft, nontender, nondistended. No organomegaly noted, normoactive bowel sounds. Musculoskeletal: No edema, cyanosis, or clubbing. Neuro: Alert, answering all questions appropriately. Cranial nerves grossly intact. Skin: No rashes or petechiae noted. Psych: Normal affect.    LAB RESULTS:  Lab Results  Component Value Date   NA 138 12/20/2014   K 4.2 12/20/2014   CL 107 12/20/2014   CO2 25 12/20/2014   GLUCOSE 97 12/20/2014   BUN 15 12/20/2014   CREATININE 0.76 12/20/2014   CALCIUM 8.6* 12/20/2014   PROT 6.9 11/06/2013   ALBUMIN 4.5 11/06/2013   AST 10 11/06/2013   ALT 9 11/06/2013   ALKPHOS 53 11/06/2013   BILITOT 0.4 11/06/2013   GFRNONAA >60 12/20/2014   GFRAA >60 12/20/2014    Lab Results  Component Value Date   WBC 126.5* 12/11/2015   NEUTROABS 3.8 12/11/2015   HGB 12.5 12/11/2015   HCT 39.7 12/11/2015   MCV 91.2 12/11/2015   PLT 255 12/11/2015  STUDIES: No results found.  ASSESSMENT: CLL, Rai stage 1  PLAN:    1.  CLL: Peripheral blood flow cytometry confirmed the results.  Patient's white blood cell count has significantly increased and now has a doubling time of approximately 6 months. She has no other cytopenias. She admits to night sweats, but no other B symptoms. CT scan results from February 07, 2015 reviewed independently with significant improvement of her lymphadenopathy. Patient completed 4 cycles of weekly  Rituxan in May 2016. Have recommended to patient that she reinitiate at minimum Rituxan treatments although she might get a more durable response with Rituxan plus Treanda or FCR.  Patient is declining any treatment at this time, but has agreed to return to clinic in 1 month for repeat laboratory work and further evaluation.    Approximately 30 minutes was spent in discussion of which greater than 50% was consultation.  Patient expressed understanding and was in agreement with this plan. She also understands that She can call clinic at any time with any questions, concerns, or complaints.    Lloyd Huger, MD   12/17/2015 12:59 PM

## 2015-12-17 NOTE — Telephone Encounter (Signed)
Would like her to have rituxan the same day if possible,  Thanks.

## 2015-12-17 NOTE — Telephone Encounter (Signed)
Patient said when she came for last md visit Woodfin Ganja wanted to get her in the next week to start tx but she said she had been in denial and asked to wait a month and do labs again. Since then she has had time to think about it and realizes she does not want to wait. There was an opening on Finn's schedule this Wednesday and (since he originally wanted her to come in the week following her previous appt) we went ahead and put her on for Wednesday, 12/19/15 per pt request.

## 2015-12-18 ENCOUNTER — Telehealth: Payer: Self-pay | Admitting: Oncology

## 2015-12-18 NOTE — Telephone Encounter (Signed)
Patient informed that she is scheduled for Rituxan but Dr. Grayland Ormond will discuss other possible options at that appt.

## 2015-12-18 NOTE — Telephone Encounter (Signed)
Patient said to go ahead and schedule for lab/md/Rituxan next Tuesday (already done per [facility]) but she wanted to verify that it is actually Rituxan because she thought Woodfin Ganja had said when they last spoke that he felt she wasn't responding well to the Rituxan and he might want to try a more traditional chemo. She said if that is the case to please let her know what chemo it will be and to call her and leave a detailed voicemail about it on [phone]. She said if it is going to be Rituxan that she recalled last year having to go through a process getting it approved by insurance and now she has [employer] and wants to make sure it is approved in time. Thanks.

## 2015-12-19 ENCOUNTER — Inpatient Hospital Stay: Payer: Managed Care, Other (non HMO) | Admitting: Oncology

## 2015-12-19 ENCOUNTER — Inpatient Hospital Stay: Payer: Managed Care, Other (non HMO)

## 2015-12-19 ENCOUNTER — Other Ambulatory Visit: Payer: Self-pay | Admitting: Oncology

## 2015-12-19 DIAGNOSIS — C9110 Chronic lymphocytic leukemia of B-cell type not having achieved remission: Secondary | ICD-10-CM

## 2015-12-25 ENCOUNTER — Other Ambulatory Visit: Payer: Self-pay | Admitting: *Deleted

## 2015-12-25 ENCOUNTER — Inpatient Hospital Stay: Payer: Managed Care, Other (non HMO)

## 2015-12-25 ENCOUNTER — Inpatient Hospital Stay (HOSPITAL_BASED_OUTPATIENT_CLINIC_OR_DEPARTMENT_OTHER): Payer: Managed Care, Other (non HMO) | Admitting: Oncology

## 2015-12-25 ENCOUNTER — Inpatient Hospital Stay: Payer: Managed Care, Other (non HMO) | Attending: Oncology

## 2015-12-25 VITALS — BP 142/70 | HR 78 | Wt 142.4 lb

## 2015-12-25 DIAGNOSIS — C9110 Chronic lymphocytic leukemia of B-cell type not having achieved remission: Secondary | ICD-10-CM

## 2015-12-25 DIAGNOSIS — R232 Flushing: Secondary | ICD-10-CM | POA: Diagnosis not present

## 2015-12-25 DIAGNOSIS — R61 Generalized hyperhidrosis: Secondary | ICD-10-CM | POA: Insufficient documentation

## 2015-12-25 DIAGNOSIS — R5383 Other fatigue: Secondary | ICD-10-CM | POA: Diagnosis not present

## 2015-12-25 DIAGNOSIS — R51 Headache: Secondary | ICD-10-CM | POA: Insufficient documentation

## 2015-12-25 DIAGNOSIS — Z823 Family history of stroke: Secondary | ICD-10-CM

## 2015-12-25 DIAGNOSIS — E785 Hyperlipidemia, unspecified: Secondary | ICD-10-CM | POA: Diagnosis not present

## 2015-12-25 DIAGNOSIS — R531 Weakness: Secondary | ICD-10-CM | POA: Diagnosis not present

## 2015-12-25 DIAGNOSIS — R600 Localized edema: Secondary | ICD-10-CM | POA: Diagnosis not present

## 2015-12-25 DIAGNOSIS — Z5112 Encounter for antineoplastic immunotherapy: Secondary | ICD-10-CM | POA: Diagnosis present

## 2015-12-25 LAB — COMPREHENSIVE METABOLIC PANEL
ALT: 14 U/L (ref 14–54)
AST: 20 U/L (ref 15–41)
Albumin: 4.3 g/dL (ref 3.5–5.0)
Alkaline Phosphatase: 47 U/L (ref 38–126)
Anion gap: 7 (ref 5–15)
BUN: 18 mg/dL (ref 6–20)
CHLORIDE: 105 mmol/L (ref 101–111)
CO2: 25 mmol/L (ref 22–32)
Calcium: 8.8 mg/dL — ABNORMAL LOW (ref 8.9–10.3)
Creatinine, Ser: 0.94 mg/dL (ref 0.44–1.00)
Glucose, Bld: 93 mg/dL (ref 65–99)
POTASSIUM: 4 mmol/L (ref 3.5–5.1)
Sodium: 137 mmol/L (ref 135–145)
TOTAL PROTEIN: 6.5 g/dL (ref 6.5–8.1)
Total Bilirubin: 0.6 mg/dL (ref 0.3–1.2)

## 2015-12-25 LAB — CBC WITH DIFFERENTIAL/PLATELET
BASOS PCT: 0 %
Basophils Absolute: 0 10*3/uL (ref 0–0.1)
EOS ABS: 0 10*3/uL (ref 0–0.7)
Eosinophils Relative: 0 %
HCT: 34.7 % — ABNORMAL LOW (ref 35.0–47.0)
Hemoglobin: 11.7 g/dL — ABNORMAL LOW (ref 12.0–16.0)
LYMPHS ABS: 130.1 10*3/uL — AB (ref 1.0–3.6)
Lymphocytes Relative: 91 %
MCH: 29.8 pg (ref 26.0–34.0)
MCHC: 33.8 g/dL (ref 32.0–36.0)
MCV: 88.2 fL (ref 80.0–100.0)
MONO ABS: 7.2 10*3/uL — AB (ref 0.2–0.9)
Monocytes Relative: 5 %
NEUTROS ABS: 5.7 10*3/uL (ref 1.4–6.5)
Neutrophils Relative %: 4 %
PLATELETS: 307 10*3/uL (ref 150–440)
RBC: 3.93 MIL/uL (ref 3.80–5.20)
RDW: 15.1 % — AB (ref 11.5–14.5)
WBC: 143 10*3/uL (ref 3.6–11.0)

## 2015-12-25 MED ORDER — SODIUM CHLORIDE 0.9 % IV SOLN
375.0000 mg/m2 | Freq: Once | INTRAVENOUS | Status: AC
  Administered 2015-12-25: 700 mg via INTRAVENOUS
  Filled 2015-12-25: qty 60

## 2015-12-25 MED ORDER — ACETAMINOPHEN 325 MG PO TABS
650.0000 mg | ORAL_TABLET | Freq: Once | ORAL | Status: AC
  Administered 2015-12-25: 650 mg via ORAL
  Filled 2015-12-25: qty 2

## 2015-12-25 MED ORDER — DIPHENHYDRAMINE HCL 25 MG PO CAPS
25.0000 mg | ORAL_CAPSULE | Freq: Once | ORAL | Status: AC
  Administered 2015-12-25: 25 mg via ORAL
  Filled 2015-12-25: qty 1

## 2015-12-25 MED ORDER — SODIUM CHLORIDE 0.9 % IV SOLN
Freq: Once | INTRAVENOUS | Status: AC
  Administered 2015-12-25: 11:00:00 via INTRAVENOUS
  Filled 2015-12-25: qty 1000

## 2015-12-25 MED ORDER — DEXAMETHASONE 4 MG PO TABS
10.0000 mg | ORAL_TABLET | Freq: Once | ORAL | Status: AC
  Administered 2015-12-25: 10 mg via ORAL
  Filled 2015-12-25: qty 3

## 2016-01-01 ENCOUNTER — Inpatient Hospital Stay (HOSPITAL_BASED_OUTPATIENT_CLINIC_OR_DEPARTMENT_OTHER): Payer: Managed Care, Other (non HMO) | Admitting: Oncology

## 2016-01-01 ENCOUNTER — Inpatient Hospital Stay: Payer: Managed Care, Other (non HMO)

## 2016-01-01 VITALS — BP 120/48 | HR 74 | Temp 97.7°F | Wt 141.5 lb

## 2016-01-01 DIAGNOSIS — Z823 Family history of stroke: Secondary | ICD-10-CM

## 2016-01-01 DIAGNOSIS — C9110 Chronic lymphocytic leukemia of B-cell type not having achieved remission: Secondary | ICD-10-CM | POA: Diagnosis not present

## 2016-01-01 DIAGNOSIS — E785 Hyperlipidemia, unspecified: Secondary | ICD-10-CM | POA: Diagnosis not present

## 2016-01-01 DIAGNOSIS — Z5112 Encounter for antineoplastic immunotherapy: Secondary | ICD-10-CM | POA: Diagnosis not present

## 2016-01-01 LAB — CBC WITH DIFFERENTIAL/PLATELET
BASOS ABS: 1.6 10*3/uL — AB (ref 0–0.1)
Basophils Relative: 1 %
EOS PCT: 0 %
Eosinophils Absolute: 0 10*3/uL (ref 0–0.7)
HCT: 35.9 % (ref 35.0–47.0)
HEMOGLOBIN: 12 g/dL (ref 12.0–16.0)
LYMPHS ABS: 152.2 10*3/uL — AB (ref 1.0–3.6)
LYMPHS PCT: 93 %
MCH: 29.4 pg (ref 26.0–34.0)
MCHC: 33.4 g/dL (ref 32.0–36.0)
MCV: 88.1 fL (ref 80.0–100.0)
MONOS PCT: 1 %
Monocytes Absolute: 1.6 10*3/uL — ABNORMAL HIGH (ref 0.2–0.9)
NEUTROS ABS: 8.2 10*3/uL — AB (ref 1.4–6.5)
Neutrophils Relative %: 5 %
PLATELETS: 248 10*3/uL (ref 150–440)
RBC: 4.07 MIL/uL (ref 3.80–5.20)
RDW: 14.9 % — ABNORMAL HIGH (ref 11.5–14.5)
WBC: 163.6 10*3/uL (ref 3.6–11.0)

## 2016-01-01 LAB — COMPREHENSIVE METABOLIC PANEL
ALK PHOS: 51 U/L (ref 38–126)
ALT: 17 U/L (ref 14–54)
AST: 15 U/L (ref 15–41)
Albumin: 4.4 g/dL (ref 3.5–5.0)
Anion gap: 5 (ref 5–15)
BUN: 20 mg/dL (ref 6–20)
CHLORIDE: 109 mmol/L (ref 101–111)
CO2: 25 mmol/L (ref 22–32)
Calcium: 8.9 mg/dL (ref 8.9–10.3)
Creatinine, Ser: 0.89 mg/dL (ref 0.44–1.00)
GFR calc Af Amer: >60 mL/min (ref >60)
GFR calc non Af Amer: >60 mL/min (ref >60)
Glucose, Bld: 95 mg/dL (ref 65–99)
Potassium: 4.3 mmol/L (ref 3.5–5.1)
SODIUM: 139 mmol/L (ref 135–145)
Total Bilirubin: 0.5 mg/dL (ref 0.3–1.2)
Total Protein: 6.9 g/dL (ref 6.5–8.1)

## 2016-01-01 MED ORDER — SODIUM CHLORIDE 0.9 % IV SOLN: 375.0000 mg/m2 | Freq: Once | INTRAVENOUS | Status: DC

## 2016-01-01 MED ORDER — SODIUM CHLORIDE 0.9 % IV SOLN
10.0000 mg | Freq: Once | INTRAVENOUS | Status: AC
  Administered 2016-01-01: 10 mg via INTRAVENOUS
  Filled 2016-01-01: qty 1

## 2016-01-01 MED ORDER — ACETAMINOPHEN 325 MG PO TABS
650.0000 mg | ORAL_TABLET | Freq: Once | ORAL | Status: AC
  Administered 2016-01-01: 650 mg via ORAL
  Filled 2016-01-01: qty 2

## 2016-01-01 MED ORDER — DIPHENHYDRAMINE HCL 25 MG PO CAPS
25.0000 mg | ORAL_CAPSULE | Freq: Once | ORAL | Status: AC
  Administered 2016-01-01: 25 mg via ORAL
  Filled 2016-01-01: qty 1

## 2016-01-01 MED ORDER — SODIUM CHLORIDE 0.9 % IV SOLN
700.0000 mg | Freq: Once | INTRAVENOUS | Status: AC
  Administered 2016-01-01: 700 mg via INTRAVENOUS
  Filled 2016-01-01: qty 60

## 2016-01-01 MED ORDER — SODIUM CHLORIDE 0.9 % IV SOLN
Freq: Once | INTRAVENOUS | Status: AC
  Administered 2016-01-01: 10:00:00 via INTRAVENOUS
  Filled 2016-01-01: qty 1000

## 2016-01-01 NOTE — Progress Notes (Signed)
[place]  Telephone:(336) [phone] Fax:(336) [phone]  ID: Franne Forts OB: [birth year]  MR#: [id number]  [id number]  Patient Care Team: Wardell Honour, MD as PCP - General (Family Medicine)  CHIEF COMPLAINT:  CLL, Rai stage 1  INTERVAL HISTORY: Patient returns to clinic today for further evaluation and cycle 2 of weekly Rituxan. Patient reports that she had flu like symptoms for a couple days post cycle 1 and she remembers the last time she had Rituxan in 2016 she was give a steroid that helped with those post infusion symptoms. Otherwise she tolerated cycle 1 well. She continues to feel well and is asymptomatic. She denies any fevers or B symptoms. She has no neurologic complaints.  She denies any weight loss.  She has no chest pain and denies any shortness of breath or cough.  She denies any nausea, vomiting, constipation, or diarrhea.  She has no melena or hematochezia.  She has no urinary complaints.  Patient offers no further specific complaints today.  REVIEW OF SYSTEMS:   Review of Systems  Constitutional: Negative for fever, weight loss, malaise/fatigue and diaphoresis.  Respiratory: Negative.  Negative for cough and shortness of breath.   Cardiovascular: Negative.  Negative for chest pain.  Gastrointestinal: Negative.   Genitourinary: Negative.   Musculoskeletal: Negative.   Neurological: Negative for sensory change and weakness.  Psychiatric/Behavioral: Negative.     As per HPI. Otherwise, a complete review of systems is negatve.  PAST MEDICAL HISTORY: Past Medical History  Diagnosis Date  . Hyperlipidemia   . Chest pain   . CLL (chronic lymphocytic leukemia) ([place])     PAST SURGICAL HISTORY: Past Surgical History  Procedure Laterality Date  . Tubal ligation      FAMILY HISTORY Family History  Problem Relation Age of Onset  . Stroke Mother 74    CVA  . Stroke Maternal Grandmother   . Stroke Maternal Grandfather   . Deep vein  thrombosis Brother 45    recurrent DVTs.       ADVANCED DIRECTIVES:    HEALTH MAINTENANCE: Social History  Substance Use Topics  . Smoking status: Never Smoker   . Smokeless tobacco: Not on file  . Alcohol Use: No     No Known Allergies  No current outpatient prescriptions on file.   No current facility-administered medications for this visit.    OBJECTIVE: Filed Vitals:   01/01/16 0844  BP: 120/48  Pulse: 74  Temp: 97.7 F (36.5 C)     Body mass index is 22.86 kg/(m^2).    ECOG FS:0 - Asymptomatic  General: Well-developed, well-nourished, no acute distress. Eyes: anicteric sclera. HEENT: Cervical lymphadenopathy resolved Lungs: Clear to auscultation bilaterally. Heart: Regular rate and rhythm. No rubs, murmurs, or gallops. Abdomen: Soft, nontender, nondistended. No organomegaly noted, normoactive bowel sounds. Musculoskeletal: No edema, cyanosis, or clubbing. Neuro: Alert, answering all questions appropriately. Cranial nerves grossly intact. Skin: No rashes or petechiae noted. Psych: Normal affect.    LAB RESULTS:  Lab Results  Component Value Date   NA 139 01/01/2016   K 4.3 01/01/2016   CL 109 01/01/2016   CO2 25 01/01/2016   GLUCOSE 95 01/01/2016   BUN 20 01/01/2016   CREATININE 0.89 01/01/2016   CALCIUM 8.9 01/01/2016   PROT 6.9 01/01/2016   ALBUMIN 4.4 01/01/2016   AST 15 01/01/2016   ALT 17 01/01/2016   ALKPHOS 51 01/01/2016   BILITOT 0.5 01/01/2016   GFRNONAA >60 01/01/2016   GFRAA >60 01/01/2016  Lab Results  Component Value Date   WBC 163.6* 01/01/2016   NEUTROABS PENDING 01/01/2016   HGB 12.0 01/01/2016   HCT 35.9 01/01/2016   MCV 88.1 01/01/2016   PLT 248 01/01/2016     STUDIES: No results found.  ASSESSMENT: CLL, Rai stage 1  PLAN:    1.  CLL: Peripheral blood flow cytometry confirmed the results. Patient completed 4 cycles of weekly Rituxan in May 2016. Proceed with cycle 2 of 4 of weekly Rituxan. WBC increased from  143 to 163 this week, expect this to decrease with continued infusions.  We will assess the durability of her repeat Rituxan dosing, but patient will likely need more aggressive treatment in the future possibly with Rituxan plus Treanda or FCR.  Return to clinic in 1 week for consideration of cycle 3.  2. Flu-like symptoms post infusion - Will add IV decadron 10 mg to remaining infusions.  Patient expressed understanding and was in agreement with this plan. She also understands that She can call clinic at any time with any questions, concerns, or complaints.    Mayra Reel, NP   01/01/2016 9:22 AM   Patient was seen and evaluated independently and I agree with the assessment and plan as dictated above.  Lloyd Huger, MD 01/04/2016 1:14 PM

## 2016-01-01 NOTE — Progress Notes (Signed)
[place]  Telephone:(336) [phone] Fax:(336) [phone]  ID: Cathy [name] OB: 03/27/1956  MR#: [id number]  [id number]  Patient Care Team: Wardell Honour, MD as PCP - General (Family Medicine)  CHIEF COMPLAINT:  CLL, Rai stage 1  INTERVAL HISTORY: Patient returns to clinic today for further evaluation and initiation of cycle 1 of weekly Rituxan. She continues to feel well and is asymptomatic. She admits to occasional night sweats, but denies any fevers or other B symptoms. She has no neurologic complaints.  She denies any weight loss.  She has no chest pain and denies any shortness of breath or cough.  She denies any nausea, vomiting, constipation, or diarrhea.  She has no melena or hematochezia.  She has no urinary complaints.  Patient offers no further specific complaints today.  REVIEW OF SYSTEMS:   Review of Systems  Constitutional: Positive for diaphoresis. Negative for fever, weight loss and malaise/fatigue.  Respiratory: Negative.  Negative for cough and shortness of breath.   Cardiovascular: Negative.  Negative for chest pain.  Gastrointestinal: Negative.   Genitourinary: Negative.   Musculoskeletal: Negative.   Neurological: Negative for sensory change and weakness.  Psychiatric/Behavioral: Negative.     As per HPI. Otherwise, a complete review of systems is negatve.  PAST MEDICAL HISTORY: Past Medical History  Diagnosis Date  . Hyperlipidemia   . Chest pain   . CLL (chronic lymphocytic leukemia) ([place])     PAST SURGICAL HISTORY: Past Surgical History  Procedure Laterality Date  . Tubal ligation      FAMILY HISTORY Family History  Problem Relation Age of Onset  . Stroke Mother 27    CVA  . Stroke Maternal Grandmother   . Stroke Maternal Grandfather   . Deep vein thrombosis Brother 45    recurrent DVTs.       ADVANCED DIRECTIVES:    HEALTH MAINTENANCE: Social History  Substance Use Topics  . Smoking status: Never Smoker    . Smokeless tobacco: Not on file  . Alcohol Use: No     Colonoscopy:  PAP:  Bone density:  Lipid panel:  No Known Allergies  No current outpatient prescriptions on file.   No current facility-administered medications for this visit.    OBJECTIVE: Filed Vitals:   12/25/15 0905  BP: 142/70  Pulse: 78     Body mass index is 23 kg/(m^2).    ECOG FS:0 - Asymptomatic  General: Well-developed, well-nourished, no acute distress. Eyes: anicteric sclera. HEENT: Cervical lymphadenopathy resolved Lungs: Clear to auscultation bilaterally. Heart: Regular rate and rhythm. No rubs, murmurs, or gallops. Abdomen: Soft, nontender, nondistended. No organomegaly noted, normoactive bowel sounds. Musculoskeletal: No edema, cyanosis, or clubbing. Neuro: Alert, answering all questions appropriately. Cranial nerves grossly intact. Skin: No rashes or petechiae noted. Psych: Normal affect.    LAB RESULTS:  Lab Results  Component Value Date   NA 137 12/25/2015   K 4.0 12/25/2015   CL 105 12/25/2015   CO2 25 12/25/2015   GLUCOSE 93 12/25/2015   BUN 18 12/25/2015   CREATININE 0.94 12/25/2015   CALCIUM 8.8* 12/25/2015   PROT 6.5 12/25/2015   ALBUMIN 4.3 12/25/2015   AST 20 12/25/2015   ALT 14 12/25/2015   ALKPHOS 47 12/25/2015   BILITOT 0.6 12/25/2015   GFRNONAA >60 12/25/2015   GFRAA >60 12/25/2015    Lab Results  Component Value Date   WBC 143.0* 12/25/2015   NEUTROABS 5.7 12/25/2015   HGB 11.7* 12/25/2015   HCT 34.7* 12/25/2015  MCV 88.2 12/25/2015   PLT 307 12/25/2015     STUDIES: No results found.  ASSESSMENT: CLL, Rai stage 1  PLAN:    1.  CLL: Peripheral blood flow cytometry confirmed the results.  Patient's white blood cell count has significantly increased and now has a doubling time of less than 6 months. She has no other cytopenias. She admits to night sweats, but no other B symptoms. CT scan results from February 07, 2015 reviewed independently with significant  improvement of her lymphadenopathy. Patient completed 4 cycles of weekly Rituxan in May 2016. Proceed with cycle 1 of 4 of weekly Rituxan. We will assess the durability of her repeat Rituxan dosing, but patient will likely need more aggressive treatment in the future possibly with Rituxan plus Treanda or FCR.  Return to clinic in 1 week for consideration of cycle 2.   Patient expressed understanding and was in agreement with this plan. She also understands that She can call clinic at any time with any questions, concerns, or complaints.    Cathy Huger, MD   01/01/2016 8:40 AM

## 2016-01-01 NOTE — Progress Notes (Signed)
When patient received Rituxan in 2016 she was receiving a Dexamethasone with her pre meds.  But this treatment regimen does not include the Dexamethasone and she can really tell a difference after treatment because she has flu like symptoms that she did not have last year.

## 2016-01-08 ENCOUNTER — Inpatient Hospital Stay (HOSPITAL_BASED_OUTPATIENT_CLINIC_OR_DEPARTMENT_OTHER): Payer: Managed Care, Other (non HMO) | Admitting: Oncology

## 2016-01-08 ENCOUNTER — Inpatient Hospital Stay: Payer: Managed Care, Other (non HMO)

## 2016-01-08 VITALS — BP 128/78 | HR 79 | Temp 97.9°F | Resp 18 | Wt 143.7 lb

## 2016-01-08 DIAGNOSIS — R51 Headache: Secondary | ICD-10-CM

## 2016-01-08 DIAGNOSIS — R600 Localized edema: Secondary | ICD-10-CM

## 2016-01-08 DIAGNOSIS — C9110 Chronic lymphocytic leukemia of B-cell type not having achieved remission: Secondary | ICD-10-CM

## 2016-01-08 DIAGNOSIS — R5383 Other fatigue: Secondary | ICD-10-CM

## 2016-01-08 DIAGNOSIS — E785 Hyperlipidemia, unspecified: Secondary | ICD-10-CM

## 2016-01-08 DIAGNOSIS — R61 Generalized hyperhidrosis: Secondary | ICD-10-CM

## 2016-01-08 DIAGNOSIS — Z5112 Encounter for antineoplastic immunotherapy: Secondary | ICD-10-CM | POA: Diagnosis not present

## 2016-01-08 DIAGNOSIS — Z823 Family history of stroke: Secondary | ICD-10-CM

## 2016-01-08 DIAGNOSIS — R232 Flushing: Secondary | ICD-10-CM

## 2016-01-08 DIAGNOSIS — R531 Weakness: Secondary | ICD-10-CM

## 2016-01-08 LAB — CBC WITH DIFFERENTIAL/PLATELET
BASOS PCT: 0 %
Basophils Absolute: 0 10*3/uL (ref 0–0.1)
EOS PCT: 0 %
Eosinophils Absolute: 0 10*3/uL (ref 0–0.7)
HEMATOCRIT: 34.3 % — AB (ref 35.0–47.0)
HEMOGLOBIN: 11.4 g/dL — AB (ref 12.0–16.0)
LYMPHS PCT: 97 %
Lymphs Abs: 160.4 10*3/uL — ABNORMAL HIGH (ref 1.0–3.6)
MCH: 29.7 pg (ref 26.0–34.0)
MCHC: 33.2 g/dL (ref 32.0–36.0)
MCV: 89.6 fL (ref 80.0–100.0)
MONOS PCT: 0 %
Monocytes Absolute: 0 10*3/uL — ABNORMAL LOW (ref 0.2–0.9)
NEUTROS ABS: 5 10*3/uL (ref 1.4–6.5)
Neutrophils Relative %: 3 %
Platelets: 248 10*3/uL (ref 150–440)
RBC: 3.83 MIL/uL (ref 3.80–5.20)
RDW: 15.2 % — ABNORMAL HIGH (ref 11.5–14.5)
WBC: 165.4 10*3/uL (ref 3.6–11.0)

## 2016-01-08 LAB — COMPREHENSIVE METABOLIC PANEL
ALK PHOS: 50 U/L (ref 38–126)
ALT: 14 U/L (ref 14–54)
ANION GAP: 5 (ref 5–15)
AST: 17 U/L (ref 15–41)
Albumin: 4.2 g/dL (ref 3.5–5.0)
BILIRUBIN TOTAL: 0.4 mg/dL (ref 0.3–1.2)
BUN: 17 mg/dL (ref 6–20)
CALCIUM: 8.7 mg/dL — AB (ref 8.9–10.3)
CO2: 25 mmol/L (ref 22–32)
CREATININE: 0.81 mg/dL (ref 0.44–1.00)
Chloride: 109 mmol/L (ref 101–111)
GFR calc non Af Amer: >60 mL/min (ref >60)
Glucose, Bld: 98 mg/dL (ref 65–99)
Potassium: 4 mmol/L (ref 3.5–5.1)
Sodium: 139 mmol/L (ref 135–145)
TOTAL PROTEIN: 6.5 g/dL (ref 6.5–8.1)

## 2016-01-08 MED ORDER — DIPHENHYDRAMINE HCL 25 MG PO CAPS
25.0000 mg | ORAL_CAPSULE | Freq: Once | ORAL | Status: AC
  Administered 2016-01-08: 25 mg via ORAL
  Filled 2016-01-08: qty 1

## 2016-01-08 MED ORDER — SODIUM CHLORIDE 0.9 % IV SOLN
375.0000 mg/m2 | Freq: Once | INTRAVENOUS | Status: AC
  Administered 2016-01-08: 700 mg via INTRAVENOUS
  Filled 2016-01-08: qty 60

## 2016-01-08 MED ORDER — SODIUM CHLORIDE 0.9 % IV SOLN
10.0000 mg | Freq: Once | INTRAVENOUS | Status: AC
  Administered 2016-01-08: 10 mg via INTRAVENOUS
  Filled 2016-01-08: qty 1

## 2016-01-08 MED ORDER — SODIUM CHLORIDE 0.9 % IV SOLN: 375.0000 mg/m2 | Freq: Once | INTRAVENOUS | Status: DC

## 2016-01-08 MED ORDER — SODIUM CHLORIDE 0.9 % IV SOLN
Freq: Once | INTRAVENOUS | Status: AC
  Administered 2016-01-08: 10:00:00 via INTRAVENOUS
  Filled 2016-01-08: qty 1000

## 2016-01-08 MED ORDER — ACETAMINOPHEN 325 MG PO TABS
650.0000 mg | ORAL_TABLET | Freq: Once | ORAL | Status: AC
  Administered 2016-01-08: 650 mg via ORAL
  Filled 2016-01-08: qty 2

## 2016-01-08 NOTE — Progress Notes (Signed)
Patient is concerned about bilateral leg and feet edema for 4 weeks that is not improving.  After her last treatment she was feeling flushed and had to leave work early now she has occasional flushing.  Also since last treatment she has a headache which is unusual for her.

## 2016-01-10 ENCOUNTER — Other Ambulatory Visit: Payer: Managed Care, Other (non HMO)

## 2016-01-10 ENCOUNTER — Ambulatory Visit: Payer: Managed Care, Other (non HMO) | Admitting: Oncology

## 2016-01-12 NOTE — Progress Notes (Signed)
[place]  Telephone:([phone] [phone]) [phone]  ID: Cathy [name] OB: [birth year]  MR#: [id number]  [id number]  Patient Care Team: Wardell Honour, MD as PCP - General (Family Medicine)  CHIEF COMPLAINT:  CLL, Rai stage 1  INTERVAL HISTORY: Patient returns to clinic today for further evaluation and cycle 3 of weekly Rituxan. She is having increased weakness and fatigue. She also complains of mild bilateral foot edema. She is having hot flashes with flush in as well as headache. She has no other neurologic complaints.  She denies any weight loss.  She has no chest pain and denies any shortness of breath or cough.  She denies any nausea, vomiting, constipation, or diarrhea.  She has no melena or hematochezia.  She has no urinary complaints.  Patient offers no further specific complaints today.  REVIEW OF SYSTEMS:   Review of Systems  Constitutional: Positive for malaise/fatigue and diaphoresis. Negative for fever and weight loss.  Respiratory: Negative.  Negative for cough and shortness of breath.   Cardiovascular: Negative.  Negative for chest pain.  Gastrointestinal: Negative.   Genitourinary: Negative.   Musculoskeletal: Negative.   Neurological: Positive for sensory change, weakness and headaches.  Psychiatric/Behavioral: Negative.     As per HPI. Otherwise, a complete review of systems is negatve.  PAST MEDICAL HISTORY: Past Medical History  Diagnosis Date  . Hyperlipidemia   . Chest pain   . CLL (chronic lymphocytic leukemia) ([place])     PAST SURGICAL HISTORY: Past Surgical History  Procedure Laterality Date  . Tubal ligation      FAMILY HISTORY Family History  Problem Relation Age of Onset  . Stroke Mother 65    CVA  . Stroke Maternal Grandmother   . Stroke Maternal Grandfather   . Deep vein thrombosis Brother 45    recurrent DVTs.       ADVANCED DIRECTIVES:    HEALTH MAINTENANCE: Social History  Substance Use Topics    . Smoking status: Never Smoker   . Smokeless tobacco: Not on file  . Alcohol Use: No     No Known Allergies  No current outpatient prescriptions on file.   No current facility-administered medications for this visit.    OBJECTIVE: Filed Vitals:   01/08/16 0922  BP: 128/78  Pulse: 79  Temp: 97.9 F (36.6 C)  Resp: 18     Body mass index is 23.21 kg/(m^2).    ECOG FS:0 - Asymptomatic  General: Well-developed, well-nourished, no acute distress. Eyes: anicteric sclera. HEENT: Cervical lymphadenopathy resolved Lungs: Clear to auscultation bilaterally. Heart: Regular rate and rhythm. No rubs, murmurs, or gallops. Abdomen: Soft, nontender, nondistended. No organomegaly noted, normoactive bowel sounds. Musculoskeletal: No edema, cyanosis, or clubbing. Neuro: Alert, answering all questions appropriately. Cranial nerves grossly intact. Skin: No rashes or petechiae noted. Psych: Normal affect.    LAB RESULTS:  Lab Results  Component Value Date   NA 139 01/08/2016   K 4.0 01/08/2016   CL 109 01/08/2016   CO2 25 01/08/2016   GLUCOSE 98 01/08/2016   BUN 17 01/08/2016   CREATININE 0.81 01/08/2016   CALCIUM 8.7* 01/08/2016   PROT 6.5 01/08/2016   ALBUMIN 4.2 01/08/2016   AST 17 01/08/2016   ALT 14 01/08/2016   ALKPHOS 50 01/08/2016   BILITOT 0.4 01/08/2016   GFRNONAA >60 01/08/2016   GFRAA >60 01/08/2016    Lab Results  Component Value Date   WBC 165.4* 01/08/2016   NEUTROABS 5.0 01/08/2016   HGB 11.4*  01/08/2016   HCT 34.3* 01/08/2016   MCV 89.6 01/08/2016   PLT 248 01/08/2016     STUDIES: No results found.  ASSESSMENT: CLL, Rai stage 1  PLAN:    1.  CLL: Peripheral blood flow cytometry confirmed the results. Patient completed 4 cycles of weekly Rituxan in May 2016. Proceed with cycle 3 of 4 of weekly Rituxan. Patient's white blood count is essentially unchanged. Will assess the durability of her repeat Rituxan dosing, but patient will likely need more  aggressive treatment in the future possibly with Rituxan plus Treanda or FCR.  Return to clinic in 1 week for consideration of cycle 4.  2. Flushing/headaches: Possibly related to treatments, continue to monitor closely. 3. Pedal edema: Mild, monitor.  Patient expressed understanding and was in agreement with this plan. She also understands that She can call clinic at any time with any questions, concerns, or complaints.    Lloyd Huger, MD   01/12/2016 8:16 AM

## 2016-01-16 ENCOUNTER — Inpatient Hospital Stay (HOSPITAL_BASED_OUTPATIENT_CLINIC_OR_DEPARTMENT_OTHER): Payer: Managed Care, Other (non HMO) | Admitting: Oncology

## 2016-01-16 ENCOUNTER — Inpatient Hospital Stay: Payer: Managed Care, Other (non HMO)

## 2016-01-16 VITALS — BP 116/71 | Temp 98.1°F | Wt 139.6 lb

## 2016-01-16 DIAGNOSIS — C9110 Chronic lymphocytic leukemia of B-cell type not having achieved remission: Secondary | ICD-10-CM | POA: Diagnosis not present

## 2016-01-16 DIAGNOSIS — R51 Headache: Secondary | ICD-10-CM | POA: Diagnosis not present

## 2016-01-16 DIAGNOSIS — E785 Hyperlipidemia, unspecified: Secondary | ICD-10-CM | POA: Diagnosis not present

## 2016-01-16 DIAGNOSIS — Z823 Family history of stroke: Secondary | ICD-10-CM

## 2016-01-16 DIAGNOSIS — R232 Flushing: Secondary | ICD-10-CM

## 2016-01-16 DIAGNOSIS — Z5112 Encounter for antineoplastic immunotherapy: Secondary | ICD-10-CM | POA: Diagnosis not present

## 2016-01-16 LAB — CBC WITH DIFFERENTIAL/PLATELET
BASOS ABS: 1.3 10*3/uL — AB (ref 0–0.1)
BASOS PCT: 1 %
EOS ABS: 1.3 10*3/uL — AB (ref 0–0.7)
Eosinophils Relative: 1 %
HEMATOCRIT: 37.7 % (ref 35.0–47.0)
Hemoglobin: 11.8 g/dL — ABNORMAL LOW (ref 12.0–16.0)
LYMPHS ABS: 124.7 10*3/uL — AB (ref 1.0–3.6)
LYMPHS PCT: 94 %
MCH: 28.8 pg (ref 26.0–34.0)
MCHC: 31.3 g/dL — ABNORMAL LOW (ref 32.0–36.0)
MCV: 91.8 fL (ref 80.0–100.0)
MONO ABS: 1.3 10*3/uL — AB (ref 0.2–0.9)
Monocytes Relative: 1 %
Neutro Abs: 4 10*3/uL (ref 1.4–6.5)
Neutrophils Relative %: 3 %
PLATELETS: 242 10*3/uL (ref 150–440)
RBC: 4.1 MIL/uL (ref 3.80–5.20)
RDW: 15.2 % — AB (ref 11.5–14.5)
WBC: 132.6 10*3/uL (ref 3.6–11.0)

## 2016-01-16 LAB — COMPREHENSIVE METABOLIC PANEL
ALT: 16 U/L (ref 14–54)
AST: 17 U/L (ref 15–41)
Albumin: 4.4 g/dL (ref 3.5–5.0)
Alkaline Phosphatase: 56 U/L (ref 38–126)
Anion gap: 7 (ref 5–15)
BILIRUBIN TOTAL: 0.5 mg/dL (ref 0.3–1.2)
BUN: 15 mg/dL (ref 6–20)
CO2: 24 mmol/L (ref 22–32)
CREATININE: 0.9 mg/dL (ref 0.44–1.00)
Calcium: 8.8 mg/dL — ABNORMAL LOW (ref 8.9–10.3)
Chloride: 108 mmol/L (ref 101–111)
Glucose, Bld: 107 mg/dL — ABNORMAL HIGH (ref 65–99)
POTASSIUM: 4.4 mmol/L (ref 3.5–5.1)
Sodium: 139 mmol/L (ref 135–145)
TOTAL PROTEIN: 6.8 g/dL (ref 6.5–8.1)

## 2016-01-16 MED ORDER — DIPHENHYDRAMINE HCL 25 MG PO CAPS
25.0000 mg | ORAL_CAPSULE | Freq: Once | ORAL | Status: AC
  Administered 2016-01-16: 25 mg via ORAL
  Filled 2016-01-16: qty 1

## 2016-01-16 MED ORDER — SODIUM CHLORIDE 0.9 % IV SOLN
700.0000 mg | Freq: Once | INTRAVENOUS | Status: AC
  Administered 2016-01-16: 700 mg via INTRAVENOUS
  Filled 2016-01-16: qty 60

## 2016-01-16 MED ORDER — SODIUM CHLORIDE 0.9 % IV SOLN
10.0000 mg | Freq: Once | INTRAVENOUS | Status: AC
  Administered 2016-01-16: 10 mg via INTRAVENOUS
  Filled 2016-01-16: qty 1

## 2016-01-16 MED ORDER — SODIUM CHLORIDE 0.9 % IV SOLN: 375.0000 mg/m2 | Freq: Once | INTRAVENOUS | Status: DC

## 2016-01-16 MED ORDER — ACETAMINOPHEN 325 MG PO TABS
650.0000 mg | ORAL_TABLET | Freq: Once | ORAL | Status: AC
  Administered 2016-01-16: 650 mg via ORAL
  Filled 2016-01-16: qty 2

## 2016-01-16 MED ORDER — SODIUM CHLORIDE 0.9 % IV SOLN
Freq: Once | INTRAVENOUS | Status: AC
  Administered 2016-01-16: 11:00:00 via INTRAVENOUS
  Filled 2016-01-16: qty 1000

## 2016-01-16 NOTE — Progress Notes (Signed)
[place]  Telephone:(336) [phone] [phone]) [phone]  ID: Cathy [name] OB: 1956-07-11  MR#: [id number]  [id number]  Patient Care Team: Wardell Honour, MD as PCP - General (Family Medicine)  CHIEF COMPLAINT:  CLL, Rai stage 1  INTERVAL HISTORY: Patient returns to clinic today for further evaluation and cycle 4 of weekly Rituxan. She did have flu like symptoms for a couple days post cycle 3 but today she feels well and is without complains.  She has no neurologic complaints.  She denies any weight loss.  She has no chest pain and denies any shortness of breath or cough.  She denies any nausea, vomiting, constipation, or diarrhea.  She has no melena or hematochezia.  She has no urinary complaints.  Patient offers no further specific complaints today.  REVIEW OF SYSTEMS:   Review of Systems  Constitutional: Negative for fever, weight loss, malaise/fatigue and diaphoresis.  Respiratory: Negative.  Negative for cough and shortness of breath.   Cardiovascular: Negative.  Negative for chest pain.  Gastrointestinal: Negative.   Genitourinary: Negative.   Musculoskeletal: Negative.   Neurological: Negative for sensory change, weakness and headaches.  Psychiatric/Behavioral: Negative.     As per HPI. Otherwise, a complete review of systems is negatve.  PAST MEDICAL HISTORY: Past Medical History  Diagnosis Date  . Hyperlipidemia   . Chest pain   . CLL (chronic lymphocytic leukemia) ([place])     PAST SURGICAL HISTORY: Past Surgical History  Procedure Laterality Date  . Tubal ligation      FAMILY HISTORY Family History  Problem Relation Age of Onset  . Stroke Mother 36    CVA  . Stroke Maternal Grandmother   . Stroke Maternal Grandfather   . Deep vein thrombosis Brother 45    recurrent DVTs.       ADVANCED DIRECTIVES:    HEALTH MAINTENANCE: Social History  Substance Use Topics  . Smoking status: Never Smoker   . Smokeless tobacco: Not on  file  . Alcohol Use: No     No Known Allergies  No current outpatient prescriptions on file.   No current facility-administered medications for this visit.    OBJECTIVE: Filed Vitals:   01/16/16 0911  BP: 116/71  Temp: 98.1 F (36.7 C)     Body mass index is 22.53 kg/(m^2).    ECOG FS:0 - Asymptomatic  General: Well-developed, well-nourished, no acute distress. Eyes: anicteric sclera. HEENT: Cervical lymphadenopathy resolved Lungs: Clear to auscultation bilaterally. Heart: Regular rate and rhythm. No rubs, murmurs, or gallops. Abdomen: Soft, nontender, nondistended. No organomegaly noted, normoactive bowel sounds. Musculoskeletal: No edema, cyanosis, or clubbing. Neuro: Alert, answering all questions appropriately. Cranial nerves grossly intact. Skin: No rashes or petechiae noted. Psych: Normal affect.    LAB RESULTS:  Lab Results  Component Value Date   NA 139 01/16/2016   K 4.4 01/16/2016   CL 108 01/16/2016   CO2 24 01/16/2016   GLUCOSE 107* 01/16/2016   BUN 15 01/16/2016   CREATININE 0.90 01/16/2016   CALCIUM 8.8* 01/16/2016   PROT 6.8 01/16/2016   ALBUMIN 4.4 01/16/2016   AST 17 01/16/2016   ALT 16 01/16/2016   ALKPHOS 56 01/16/2016   BILITOT 0.5 01/16/2016   GFRNONAA >60 01/16/2016   GFRAA >60 01/16/2016    Lab Results  Component Value Date   WBC 132.6* 01/16/2016   NEUTROABS 4.0 01/16/2016   HGB 11.8* 01/16/2016   HCT 37.7 01/16/2016   MCV 91.8 01/16/2016   PLT 242  01/16/2016     STUDIES: No results found.  ASSESSMENT: CLL, Rai stage 1  PLAN:    1.  CLL: Peripheral blood flow cytometry confirmed the results. Patient completed 4 cycles of weekly Rituxan in May 2016. Proceed with cycle 4 of 4 of weekly Rituxan. Patient's white blood count is 132 today. Will assess the durability of her repeat Rituxan dosing, but patient will likely need more aggressive treatment in the future possibly with Rituxan plus Treanda or FCR.  Return to clinic in  5-6 weeks for lab work and further evalutaion. Her son is getting married on July 8th so she would like appointment after that.  2. Flushing/headaches: Possibly related to treatments, continue to monitor closely. 3. Pedal edema: Resolved. monitor.  Patient expressed understanding and was in agreement with this plan. She also understands that She can call clinic at any time with any questions, concerns, or complaints.    Mayra Reel, NP   01/16/2016 9:59 AM   Patient was seen and evaluated independently and I agree with the assessment and plan as dictated above.  Lloyd Huger, MD 01/21/2016 11:01 PM

## 2016-01-16 NOTE — Progress Notes (Signed)
Patient did have the flu-like symptoms after last treatment but not as severe.

## 2016-02-18 ENCOUNTER — Other Ambulatory Visit: Payer: Self-pay | Admitting: Nurse Practitioner

## 2016-02-27 ENCOUNTER — Inpatient Hospital Stay: Payer: Managed Care, Other (non HMO) | Attending: Oncology | Admitting: Oncology

## 2016-02-27 ENCOUNTER — Inpatient Hospital Stay: Payer: Managed Care, Other (non HMO)

## 2016-02-27 ENCOUNTER — Encounter: Payer: Self-pay | Admitting: *Deleted

## 2016-02-27 VITALS — BP 123/76 | HR 69 | Temp 97.6°F | Resp 18 | Wt 137.0 lb

## 2016-02-27 DIAGNOSIS — R079 Chest pain, unspecified: Secondary | ICD-10-CM | POA: Insufficient documentation

## 2016-02-27 DIAGNOSIS — C9110 Chronic lymphocytic leukemia of B-cell type not having achieved remission: Secondary | ICD-10-CM

## 2016-02-27 DIAGNOSIS — R609 Edema, unspecified: Secondary | ICD-10-CM | POA: Diagnosis not present

## 2016-02-27 DIAGNOSIS — E785 Hyperlipidemia, unspecified: Secondary | ICD-10-CM | POA: Diagnosis not present

## 2016-02-27 DIAGNOSIS — M7989 Other specified soft tissue disorders: Secondary | ICD-10-CM | POA: Diagnosis not present

## 2016-02-27 LAB — COMPREHENSIVE METABOLIC PANEL
ALT: 16 U/L (ref 14–54)
AST: 17 U/L (ref 15–41)
Albumin: 4.7 g/dL (ref 3.5–5.0)
Alkaline Phosphatase: 52 U/L (ref 38–126)
Anion gap: 8 (ref 5–15)
BUN: 16 mg/dL (ref 6–20)
CHLORIDE: 104 mmol/L (ref 101–111)
CO2: 26 mmol/L (ref 22–32)
Calcium: 9.1 mg/dL (ref 8.9–10.3)
Creatinine, Ser: 0.76 mg/dL (ref 0.44–1.00)
Glucose, Bld: 101 mg/dL — ABNORMAL HIGH (ref 65–99)
POTASSIUM: 4 mmol/L (ref 3.5–5.1)
SODIUM: 138 mmol/L (ref 135–145)
Total Bilirubin: 0.6 mg/dL (ref 0.3–1.2)
Total Protein: 7.2 g/dL (ref 6.5–8.1)

## 2016-02-27 LAB — CBC WITH DIFFERENTIAL/PLATELET
BASOS ABS: 0 10*3/uL (ref 0–0.1)
Eosinophils Absolute: 1.3 10*3/uL — ABNORMAL HIGH (ref 0–0.7)
Eosinophils Relative: 1 %
HCT: 39.4 % (ref 35.0–47.0)
Hemoglobin: 12.3 g/dL (ref 12.0–16.0)
LYMPHS PCT: 94 %
Lymphs Abs: 123.2 10*3/uL — ABNORMAL HIGH (ref 1.0–3.6)
MCH: 28.8 pg (ref 26.0–34.0)
MCHC: 31.3 g/dL — ABNORMAL LOW (ref 32.0–36.0)
MCV: 92.1 fL (ref 80.0–100.0)
MONO ABS: 2.6 10*3/uL — AB (ref 0.2–0.9)
Monocytes Relative: 2 %
NEUTROS ABS: 3.9 10*3/uL (ref 1.4–6.5)
NEUTROS PCT: 3 %
PLATELETS: 276 10*3/uL (ref 150–440)
RBC: 4.28 MIL/uL (ref 3.80–5.20)
RDW: 14.3 % (ref 11.5–14.5)
WBC: 131.1 10*3/uL — AB (ref 3.6–11.0)

## 2016-02-27 MED ORDER — IBRUTINIB 140 MG PO CAPS: 420.0000 mg | ORAL_CAPSULE | Freq: Every day | ORAL | Status: DC

## 2016-02-27 NOTE — Progress Notes (Signed)
[place]  [phone]) [phone] [phone]) [phone]  ID: Cathy [name] OB: August 27, 1955  MR#: [id number]  [id number]  Patient Care Team: Wardell Honour, MD as PCP - General (Family Medicine)  CHIEF COMPLAINT:  CLL, Rai stage 1  INTERVAL HISTORY: Patient returns to clinic today for repeat laboratory work, further evaluation, and treatment planning if necessary. Her only complaint today is of occasional ankle edema. She otherwise feels well and is asymptomatic. She has no neurologic complaints.  She denies any weight loss.  She has no chest pain and denies any shortness of breath or cough.  She denies any nausea, vomiting, constipation, or diarrhea.  She has no melena or hematochezia.  She has no urinary complaints.  Patient offers no further specific complaints today.  REVIEW OF SYSTEMS:   Review of Systems  Constitutional: Negative for fever, weight loss, malaise/fatigue and diaphoresis.  Respiratory: Negative.  Negative for cough and shortness of breath.   Cardiovascular: Positive for leg swelling. Negative for chest pain.  Gastrointestinal: Negative.   Genitourinary: Negative.   Musculoskeletal: Negative.   Neurological: Negative for sensory change, weakness and headaches.  Psychiatric/Behavioral: Negative.     As per HPI. Otherwise, a complete review of systems is negatve.  PAST MEDICAL HISTORY: Past Medical History  Diagnosis Date  . Hyperlipidemia   . Chest pain   . CLL (chronic lymphocytic leukemia) ([place])     PAST SURGICAL HISTORY: Past Surgical History  Procedure Laterality Date  . Tubal ligation      FAMILY HISTORY Family History  Problem Relation Age of Onset  . Stroke Mother 60    CVA  . Stroke Maternal Grandmother   . Stroke Maternal Grandfather   . Deep vein thrombosis Brother 45    recurrent DVTs.       ADVANCED DIRECTIVES:    HEALTH MAINTENANCE: Social History  Substance Use Topics  . Smoking status: Never  Smoker   . Smokeless tobacco: Not on file  . Alcohol Use: No     No Known Allergies  Current Outpatient Prescriptions  Medication Sig Dispense Refill  . ibrutinib (IMBRUVICA) 140 MG capsul Take 3 capsules (420 mg total) by mouth daily. 90 capsule 5   No current facility-administered medications for this visit.    OBJECTIVE: Filed Vitals:   02/27/16 1053  BP: 123/76  Pulse: 69  Temp: 97.6 F (36.4 C)  Resp: 18     Body mass index is 22.13 kg/(m^2).    ECOG FS:0 - Asymptomatic  General: Well-developed, well-nourished, no acute distress. Eyes: anicteric sclera. HEENT: Cervical lymphadenopathy resolved Lungs: Clear to auscultation bilaterally. Heart: Regular rate and rhythm. No rubs, murmurs, or gallops. Abdomen: Soft, nontender, nondistended. No organomegaly noted, normoactive bowel sounds. Musculoskeletal: No edema, cyanosis, or clubbing. Neuro: Alert, answering all questions appropriately. Cranial nerves grossly intact. Skin: No rashes or petechiae noted. Psych: Normal affect.    LAB RESULTS:  Lab Results  Component Value Date   NA 138 02/27/2016   K 4.0 02/27/2016   CL 104 02/27/2016   CO2 26 02/27/2016   GLUCOSE 101* 02/27/2016   BUN 16 02/27/2016   CREATININE 0.76 02/27/2016   CALCIUM 9.1 02/27/2016   PROT 7.2 02/27/2016   ALBUMIN 4.7 02/27/2016   AST 17 02/27/2016   ALT 16 02/27/2016   ALKPHOS 52 02/27/2016   BILITOT 0.6 02/27/2016   GFRNONAA >60 02/27/2016   GFRAA >60 02/27/2016    Lab Results  Component Value Date   WBC 131.1* 02/27/2016  NEUTROABS 3.9 02/27/2016   HGB 12.3 02/27/2016   HCT 39.4 02/27/2016   MCV 92.1 02/27/2016   PLT 276 02/27/2016     STUDIES: No results found.  ASSESSMENT: CLL, Rai stage 1  PLAN:    1.  CLL: Peripheral blood flow cytometry confirmed the results. Patient completed her second round of 4 cycles of weekly Rituxan on Jan 16, 2016. Despite this, her white count has not appreciably changed and remains  131.1. After lengthy discussion with the patient, she does not wish to pursue IV chemotherapy but is willing to pursue oral treatment using Imbruvica 420 mg daily. Will proceed with treatment until progression of disease or intolerable side effects. Can also consider a treatment holiday if patient has an excellent response. Return to clinic in 4 weeks or approximately 2 weeks after initiating treatment for laboratory work and further evaluation. Initially we will monitor labs was on a monthly basis.  2. Pedal edema: Mild, monitor.   Patient expressed understanding and was in agreement with this plan. She also understands that She can call clinic at any time with any questions, concerns, or complaints.    Lloyd Huger, MD   02/27/2016 1:53 PM

## 2016-02-27 NOTE — Progress Notes (Signed)
Patient seen in clinic today by Dr. Grayland Ormond. Patient to start new oral chemotherapy drug Imbruvica (Ibrutinib). Patient educated about Imbruvica and given written educational information regarding medication. Patient verbalized understanding of treatment plan. Patient signed consent for oral chemotherapy today in clinic. Patient to notify office when she receives medication. Patient to return to clinic 2 weeks after initiation of treatment for lab and MD follow up visit.

## 2016-02-27 NOTE — Progress Notes (Signed)
Has occasional swelling in both legs/ankles. Occasional chest pain that is intermittent. Pt states has had this type of pain before and had complete cardiac workup which was negative.

## 2016-02-28 ENCOUNTER — Telehealth: Payer: Self-pay | Admitting: *Deleted

## 2016-02-28 NOTE — Telephone Encounter (Signed)
Great, Thank you!

## 2016-02-28 NOTE — Telephone Encounter (Signed)
Call received from [employer] regarding new prescription for Imbruvica. Patients insurance covers drug, copay of $75. Per [employer] they will add in discount from drug company bringing patients copay down to $10 per month. [employer] will reach out to patient regarding processing and shipping. Left vm message for patient regarding update.

## 2016-03-04 ENCOUNTER — Telehealth: Payer: Self-pay | Admitting: *Deleted

## 2016-03-04 NOTE — Telephone Encounter (Signed)
Imbruvica came Friday and she started taking it on Saturday. She has an appt 8/16 Is that all right or does it need to be moved?

## 2016-03-04 NOTE — Telephone Encounter (Signed)
Patient should return to clinic about 2 weeks after beginning [place], appointment moved up to 8/1.

## 2016-03-05 NOTE — Telephone Encounter (Signed)
Perfect. Thank you!

## 2016-03-17 ENCOUNTER — Ambulatory Visit: Payer: Managed Care, Other (non HMO) | Admitting: Oncology

## 2016-03-17 ENCOUNTER — Other Ambulatory Visit: Payer: Managed Care, Other (non HMO)

## 2016-03-18 NOTE — Progress Notes (Signed)
[place]  Telephone:(336) [phone] Fax:(336) [phone]  ID: Franne Forts OB: [birth year]  MR#: [id number]  [id number]  Patient Care Team: Wardell Honour, MD as PCP - General (Family Medicine)  CHIEF COMPLAINT:  CLL, Rai stage 1  INTERVAL HISTORY: Patient returns to clinic today for repeat laboratory work, further evaluation, and to assess her toleration of Imbruvica. She is having mild fatigue, but otherwise tolerating treatment well. She has no neurologic complaints.  She denies any weight loss.  She has no chest pain and denies any shortness of breath or cough.  She denies any nausea, vomiting, constipation, or diarrhea.  She has no melena or hematochezia.  She has no urinary complaints.  Patient offers no further specific complaints today.  REVIEW OF SYSTEMS:   Review of Systems  Constitutional: Positive for malaise/fatigue. Negative for diaphoresis, fever and weight loss.  Respiratory: Negative.  Negative for cough and shortness of breath.   Cardiovascular: Negative for chest pain and leg swelling.  Gastrointestinal: Negative.   Genitourinary: Negative.   Musculoskeletal: Negative.   Neurological: Negative for sensory change, weakness and headaches.  Psychiatric/Behavioral: Negative.     As per HPI. Otherwise, a complete review of systems is negatve.  PAST MEDICAL HISTORY: Past Medical History:  Diagnosis Date  . Chest pain   . CLL (chronic lymphocytic leukemia) ([place])   . Hyperlipidemia     PAST SURGICAL HISTORY: Past Surgical History:  Procedure Laterality Date  . TUBAL LIGATION      FAMILY HISTORY Family History  Problem Relation Age of Onset  . Stroke Mother 62    CVA  . Stroke Maternal Grandmother   . Stroke Maternal Grandfather   . Deep vein thrombosis Brother 45    recurrent DVTs.       ADVANCED DIRECTIVES:    HEALTH MAINTENANCE: Social History  Substance Use Topics  . Smoking status: Never Smoker  . Smokeless  tobacco: Not on file  . Alcohol use No     No Known Allergies  Current Outpatient Prescriptions  Medication Sig Dispense Refill  . ibrutinib (IMBRUVICA) 140 MG capsul Take 3 capsules (420 mg total) by mouth daily. 90 capsule 5  . allopurinol (ZYLOPRIM) 300 MG tablet Take 1 tablet (300 mg total) by mouth daily. 30 tablet 2   No current facility-administered medications for this visit.     OBJECTIVE: Vitals:   03/19/16 1113  BP: 116/71  Pulse: 64  Resp: 17  Temp: (!) 94.5 F (34.7 C)     Body mass index is 22.45 kg/m.    ECOG FS:0 - Asymptomatic  General: Well-developed, well-nourished, no acute distress. Eyes: anicteric sclera. HEENT: Cervical lymphadenopathy resolved Lungs: Clear to auscultation bilaterally. Heart: Regular rate and rhythm. No rubs, murmurs, or gallops. Abdomen: Soft, nontender, nondistended. No organomegaly noted, normoactive bowel sounds. Musculoskeletal: No edema, cyanosis, or clubbing. Neuro: Alert, answering all questions appropriately. Cranial nerves grossly intact. Skin: No rashes or petechiae noted. Psych: Normal affect.    LAB RESULTS:  Lab Results  Component Value Date   NA 136 03/19/2016   K 4.1 03/19/2016   CL 104 03/19/2016   CO2 26 03/19/2016   GLUCOSE 88 03/19/2016   BUN 15 03/19/2016   CREATININE 0.86 03/19/2016   CALCIUM 8.5 (L) 03/19/2016   PROT 6.8 03/19/2016   ALBUMIN 4.4 03/19/2016   AST 17 03/19/2016   ALT 14 03/19/2016   ALKPHOS 46 03/19/2016   BILITOT 0.8 03/19/2016   GFRNONAA >60 03/19/2016  GFRAA >60 03/19/2016    Lab Results  Component Value Date   WBC 191.1 (HH) 03/19/2016   NEUTROABS 3.8 03/19/2016   HGB 11.1 (L) 03/19/2016   HCT 33.7 (L) 03/19/2016   MCV 89.7 03/19/2016   PLT 206 03/19/2016     STUDIES: No results found.  ASSESSMENT: CLL, Rai stage 1  PLAN:    1.  CLL: Peripheral blood flow cytometry confirmed the results. Patient completed her second round of 4 cycles of weekly Rituxan on  Jan 16, 2016. Patient's white count has significantly elevated to 191, but this is expected in the first 3-4 weeks of treatment. Continue Imbruvica 420 mg daily. Will proceed with treatment until progression of disease or intolerable side effects. Can also consider a treatment holiday if patient has an excellent response. Return to clinic in 4 and 8 weeks for laboratory work only and then in 3 months for further evaluation. Patient has also been instructed to reinitiate 300 mg allopurinol daily.  2. Pedal edema: Mild, monitor.   Approximately 30 minutes was spent in discussion of which greater than 50% was consultation.  Patient expressed understanding and was in agreement with this plan. She also understands that She can call clinic at any time with any questions, concerns, or complaints.    Lloyd Huger, MD   03/21/2016 1:49 PM

## 2016-03-19 ENCOUNTER — Inpatient Hospital Stay: Payer: Managed Care, Other (non HMO)

## 2016-03-19 ENCOUNTER — Inpatient Hospital Stay: Payer: Managed Care, Other (non HMO) | Attending: Oncology | Admitting: Oncology

## 2016-03-19 ENCOUNTER — Other Ambulatory Visit: Payer: Self-pay

## 2016-03-19 VITALS — BP 116/71 | HR 64 | Temp 94.5°F | Resp 17 | Ht 66.0 in | Wt 139.1 lb

## 2016-03-19 DIAGNOSIS — C9110 Chronic lymphocytic leukemia of B-cell type not having achieved remission: Secondary | ICD-10-CM

## 2016-03-19 DIAGNOSIS — Z79899 Other long term (current) drug therapy: Secondary | ICD-10-CM | POA: Insufficient documentation

## 2016-03-19 DIAGNOSIS — R079 Chest pain, unspecified: Secondary | ICD-10-CM

## 2016-03-19 DIAGNOSIS — R531 Weakness: Secondary | ICD-10-CM | POA: Insufficient documentation

## 2016-03-19 DIAGNOSIS — R5383 Other fatigue: Secondary | ICD-10-CM | POA: Diagnosis not present

## 2016-03-19 DIAGNOSIS — R609 Edema, unspecified: Secondary | ICD-10-CM | POA: Insufficient documentation

## 2016-03-19 LAB — COMPREHENSIVE METABOLIC PANEL
ALK PHOS: 46 U/L (ref 38–126)
ALT: 14 U/L (ref 14–54)
AST: 17 U/L (ref 15–41)
Albumin: 4.4 g/dL (ref 3.5–5.0)
Anion gap: 6 (ref 5–15)
BILIRUBIN TOTAL: 0.8 mg/dL (ref 0.3–1.2)
BUN: 15 mg/dL (ref 6–20)
CALCIUM: 8.5 mg/dL — AB (ref 8.9–10.3)
CHLORIDE: 104 mmol/L (ref 101–111)
CO2: 26 mmol/L (ref 22–32)
CREATININE: 0.86 mg/dL (ref 0.44–1.00)
Glucose, Bld: 88 mg/dL (ref 65–99)
Potassium: 4.1 mmol/L (ref 3.5–5.1)
Sodium: 136 mmol/L (ref 135–145)
TOTAL PROTEIN: 6.8 g/dL (ref 6.5–8.1)

## 2016-03-19 LAB — CBC WITH DIFFERENTIAL/PLATELET
Basophils Absolute: 0 10*3/uL (ref 0–0.1)
Basophils Relative: 0 %
EOS PCT: 0 %
Eosinophils Absolute: 0 10*3/uL (ref 0–0.7)
HEMATOCRIT: 33.7 % — AB (ref 35.0–47.0)
HEMOGLOBIN: 11.1 g/dL — AB (ref 12.0–16.0)
Lymphocytes Relative: 97 %
Lymphs Abs: 185.4 10*3/uL — ABNORMAL HIGH (ref 1.0–3.6)
MCH: 29.7 pg (ref 26.0–34.0)
MCHC: 33.1 g/dL (ref 32.0–36.0)
MCV: 89.7 fL (ref 80.0–100.0)
MONO ABS: 1.9 10*3/uL — AB (ref 0.2–0.9)
Monocytes Relative: 1 %
Neutro Abs: 3.8 10*3/uL (ref 1.4–6.5)
Neutrophils Relative %: 2 %
Platelets: 206 10*3/uL (ref 150–440)
RBC: 3.75 MIL/uL — AB (ref 3.80–5.20)
RDW: 14.1 % (ref 11.5–14.5)
WBC: 191.1 10*3/uL — AB (ref 3.6–11.0)

## 2016-03-19 LAB — PHOSPHORUS: PHOSPHORUS: 3.9 mg/dL (ref 2.5–4.6)

## 2016-03-19 LAB — MAGNESIUM: MAGNESIUM: 1.9 mg/dL (ref 1.7–2.4)

## 2016-03-19 NOTE — Progress Notes (Signed)
Pt complains of moderate fatigue and soreness in different areas.  A few headaches and nausea that is irregular.  No other concerns

## 2016-03-20 ENCOUNTER — Other Ambulatory Visit: Payer: Self-pay | Admitting: *Deleted

## 2016-03-20 MED ORDER — ALLOPURINOL 300 MG PO TABS: 300.0000 mg | ORAL_TABLET | Freq: Every day | ORAL | 2 refills | Status: DC

## 2016-03-20 NOTE — Telephone Encounter (Signed)
Bottle she had at home is expired, needs a refill sent to [facility] in [facility]

## 2016-04-02 ENCOUNTER — Ambulatory Visit: Payer: Managed Care, Other (non HMO) | Admitting: Oncology

## 2016-04-02 ENCOUNTER — Other Ambulatory Visit: Payer: Managed Care, Other (non HMO)

## 2016-04-16 ENCOUNTER — Inpatient Hospital Stay: Payer: Managed Care, Other (non HMO)

## 2016-04-16 DIAGNOSIS — C9110 Chronic lymphocytic leukemia of B-cell type not having achieved remission: Secondary | ICD-10-CM | POA: Diagnosis not present

## 2016-04-16 LAB — CBC WITH DIFFERENTIAL/PLATELET
BASOS ABS: 2.2 10*3/uL — AB (ref 0–0.1)
Basophils Relative: 1 %
EOS ABS: 0 10*3/uL (ref 0–0.7)
Eosinophils Relative: 0 %
HEMATOCRIT: 34.8 % — AB (ref 35.0–47.0)
HEMOGLOBIN: 11.7 g/dL — AB (ref 12.0–16.0)
LYMPHS PCT: 94 %
Lymphs Abs: 208.1 10*3/uL — ABNORMAL HIGH (ref 1.0–3.6)
MCH: 30 pg (ref 26.0–34.0)
MCHC: 33.7 g/dL (ref 32.0–36.0)
MCV: 88.9 fL (ref 80.0–100.0)
Monocytes Absolute: 4.4 10*3/uL — ABNORMAL HIGH (ref 0.2–0.9)
Monocytes Relative: 2 %
NEUTROS ABS: 6.6 10*3/uL — AB (ref 1.4–6.5)
NEUTROS PCT: 3 %
Platelets: 166 10*3/uL (ref 150–440)
RBC: 3.91 MIL/uL (ref 3.80–5.20)
RDW: 14.5 % (ref 11.5–14.5)
WBC: 221.3 10*3/uL (ref 3.6–11.0)

## 2016-04-16 LAB — PHOSPHORUS: Phosphorus: 3.4 mg/dL (ref 2.5–4.6)

## 2016-04-16 LAB — COMPREHENSIVE METABOLIC PANEL
ALBUMIN: 4.1 g/dL (ref 3.5–5.0)
ALT: 21 U/L (ref 14–54)
AST: 17 U/L (ref 15–41)
Alkaline Phosphatase: 45 U/L (ref 38–126)
Anion gap: 6 (ref 5–15)
BILIRUBIN TOTAL: 0.4 mg/dL (ref 0.3–1.2)
BUN: 19 mg/dL (ref 6–20)
CO2: 24 mmol/L (ref 22–32)
Calcium: 8.5 mg/dL — ABNORMAL LOW (ref 8.9–10.3)
Chloride: 106 mmol/L (ref 101–111)
Creatinine, Ser: 0.8 mg/dL (ref 0.44–1.00)
GFR calc Af Amer: >60 mL/min (ref >60)
GFR calc non Af Amer: >60 mL/min (ref >60)
GLUCOSE: 118 mg/dL — AB (ref 65–99)
POTASSIUM: 3.8 mmol/L (ref 3.5–5.1)
Sodium: 136 mmol/L (ref 135–145)
TOTAL PROTEIN: 6.3 g/dL — AB (ref 6.5–8.1)

## 2016-04-16 LAB — MAGNESIUM: Magnesium: 2 mg/dL (ref 1.7–2.4)

## 2016-04-28 ENCOUNTER — Ambulatory Visit
Admission: EM | Admit: 2016-04-28 | Discharge: 2016-04-28 | Disposition: A | Discharge status: Home / Self Care | Payer: Managed Care, Other (non HMO) | Attending: Internal Medicine | Admitting: Internal Medicine

## 2016-04-28 ENCOUNTER — Encounter: Payer: Self-pay | Admitting: *Deleted

## 2016-04-28 DIAGNOSIS — N390 Urinary tract infection, site not specified: Secondary | ICD-10-CM

## 2016-04-28 LAB — CBC WITH DIFFERENTIAL/PLATELET
Basophils Absolute: 0 10*3/uL (ref 0–0.1)
Basophils Relative: 0 %
Eosinophils Absolute: 0 10*3/uL (ref 0–0.7)
Eosinophils Relative: 0 %
HCT: 34.9 % — ABNORMAL LOW (ref 35.0–47.0)
Hemoglobin: 11.2 g/dL — ABNORMAL LOW (ref 12.0–16.0)
Lymphocytes Relative: 90 %
Lymphs Abs: 226.8 10*3/uL — ABNORMAL HIGH (ref 1.0–3.6)
MCH: 28.5 pg (ref 26.0–34.0)
MCHC: 32 g/dL (ref 32.0–36.0)
MCV: 89.1 fL (ref 80.0–100.0)
Monocytes Absolute: 5 10*3/uL — ABNORMAL HIGH (ref 0.2–0.9)
Monocytes Relative: 2 %
Neutro Abs: 20.2 10*3/uL — ABNORMAL HIGH (ref 1.4–6.5)
Neutrophils Relative %: 8 %
Platelets: 224 10*3/uL (ref 150–440)
RBC: 3.91 MIL/uL (ref 3.80–5.20)
RDW: 14.6 % — ABNORMAL HIGH (ref 11.5–14.5)
WBC: 252 10*3/uL (ref 3.6–11.0)

## 2016-04-28 LAB — URINALYSIS COMPLETE WITH MICROSCOPIC (ARMC ONLY)
Bilirubin Urine: NEGATIVE
Glucose, UA: NEGATIVE mg/dL
Ketones, ur: 15 mg/dL — AB
Nitrite: NEGATIVE
Protein, ur: NEGATIVE mg/dL
Specific Gravity, Urine: 1.01 (ref 1.005–1.030)
Squamous Epithelial / LPF: NONE SEEN
pH: 6 (ref 5.0–8.0)

## 2016-04-28 MED ORDER — SULFAMETHOXAZOLE-TRIMETHOPRIM 800-160 MG PO TABS: 1.0000 | ORAL_TABLET | Freq: Two times a day (BID) | ORAL | 0 refills | Status: AC

## 2016-04-28 MED ORDER — OXYCODONE-ACETAMINOPHEN 5-325 MG PO TABS: 1.0000 | ORAL_TABLET | ORAL | 0 refills | Status: AC | PRN

## 2016-04-28 NOTE — ED Triage Notes (Signed)
Patient has had lower abdominal pain starting 2 days ago. Patient states that she feels like she is having menstrual cramps and starting having severe bloody vaginal discharge today.

## 2016-04-29 ENCOUNTER — Telehealth: Payer: Self-pay | Admitting: *Deleted

## 2016-04-29 NOTE — Telephone Encounter (Signed)
Called to ask what she is to do regarding the fact that she is having heavy menstrual bleeding since Saturday and has not hd a cycle since June. Should she see a GYN? Also reports that she is having sx of UTI. Asking if she can get flu vaccine. Please advise

## 2016-04-29 NOTE — Telephone Encounter (Signed)
States she went to Urgent Care last night and they put her abx for UTI, they had done a UA last night. Advised that she needs to see her GYN. She told me she does not have a GYN, but had called [facility] and was told she could not get in for 3 weeks to be seen. I have called [facility] to see if we can get her in any soonest. They reported the sonest they can see her is 9/27. I ihave asked the [place] office to assist in getting her in somewhere sooner

## 2016-04-29 NOTE — Telephone Encounter (Signed)
See gynecology for her heavy menstrual bleeding. She can come in here for a UA and culture to assess if she has UTI. And if available she can also have the flu vaccine.

## 2016-04-29 NOTE — ED Provider Notes (Signed)
[facility]    CSN: [id number] Arrival date & time: 04/28/16  1857  First Provider Contact:  None       History   Chief Complaint Chief Complaint  Patient presents with  . Abdominal Pain    HPI Cathy [name] is a 60 y.o. female.   60 year old female with CLL currently on Imbruvica oral chemotherapy presents to urgent care complaining of vaginal bleeding. The patient states that she thought she had completed menopause a few months ago but today had large volume menstrual flow. The patient admits to passing clots and having flow heavier than at any time during her childbearing years. She also complains of abdominal cramping and some nausea. She denies fevers, vomiting or diarrhea.      Past Medical History:  Diagnosis Date  . Chest pain   . CLL (chronic lymphocytic leukemia) ([place])   . Hyperlipidemia     Patient Active Problem List   Diagnosis Date Noted  . CLL (chronic lymphocytic leukemia) ([place]) 12/15/2014  . Chest pain 12/01/2013    Past Surgical History:  Procedure Laterality Date  . TUBAL LIGATION      OB History    No data available       Home Medications    Prior to Admission medications   Medication Sig Start Date End Date Taking? Authorizing Provider  ibrutinib (IMBRUVICA) 140 MG capsul Take 3 capsules (420 mg total) by mouth daily. 02/27/16  Yes Lloyd Huger, MD  allopurinol (ZYLOPRIM) 300 MG tablet Take 1 tablet (300 mg total) by mouth daily. 03/20/16   Lloyd Huger, MD  oxyCODONE-acetaminophen (PERCOCET/ROXICET) 5-325 MG tablet Take 1 tablet by mouth every 4 (four) hours as needed for severe pain. 04/28/16 05/03/16  Harrie Foreman, MD  sulfamethoxazole-trimethoprim (BACTRIM DS,SEPTRA DS) 800-160 MG tablet Take 1 tablet by mouth 2 (two) times daily. 04/28/16 05/05/16  Harrie Foreman, MD    Family History Family History  Problem Relation Age of Onset  . Stroke Mother 25    CVA  . Deep vein thrombosis Brother 45   recurrent DVTs.  . Stroke Maternal Grandmother   . Stroke Maternal Grandfather     Social History Social History  Substance Use Topics  . Smoking status: Never Smoker  . Smokeless tobacco: Never Used  . Alcohol use No     Allergies   Review of patient's allergies indicates no known allergies.   Review of Systems Review of Systems  Constitutional: Negative for chills and fever.  Respiratory: Negative for cough and shortness of breath.   Cardiovascular: Negative for chest pain.  Gastrointestinal: Positive for abdominal pain and nausea. Negative for diarrhea and vomiting.  Genitourinary: Positive for difficulty urinating and vaginal bleeding. Negative for dysuria and flank pain.  Skin: Negative for pallor and rash.     Physical Exam Triage Vital Signs ED Triage Vitals  Enc Vitals Group     BP 04/28/16 1913 134/66     Pulse Rate 04/28/16 1913 76     Resp 04/28/16 1913 18     Temp 04/28/16 1913 98 F (36.7 C)     Temp Source 04/28/16 1913 Oral     SpO2 04/28/16 1913 100 %     Weight 04/28/16 1914 140 lb (63.5 kg)     Height 04/28/16 1914 5\' 5"  (1.651 m)     Head Circumference --      Peak Flow --      Pain Score 04/28/16 1915 8  Pain Loc --      Pain Edu? --      Excl. in [place]? --    No data found.   Updated Vital Signs BP 134/66 (BP Location: Left Arm)   Pulse 76   Temp 98 F (36.7 C) (Oral)   Resp 18   Ht 5\' 5"  (1.651 m)   Wt 140 lb (63.5 kg)   LMP 04/21/2016   SpO2 100%   BMI 23.30 kg/m   Visual Acuity Right Eye Distance:   Left Eye Distance:   Bilateral Distance:    Right Eye Near:   Left Eye Near:    Bilateral Near:     Physical Exam  Constitutional: She appears well-developed and well-nourished. No distress.  HENT:  Head: Normocephalic and atraumatic.  Eyes: Conjunctivae are normal.  Neck: Neck supple.  Cardiovascular: Normal rate and regular rhythm.   No murmur heard. Pulmonary/Chest: Effort normal and breath sounds normal. No  respiratory distress.  Abdominal: Soft. She exhibits no mass. There is tenderness (suprapubic). There is no rebound and no guarding.  Musculoskeletal: She exhibits no edema.  Neurological: She is alert.  Skin: Skin is warm and dry. Capillary refill takes less than 2 seconds.  Psychiatric: She has a normal mood and affect.  Nursing note and vitals reviewed.    UC Treatments / Results  Labs (all labs ordered are listed, but only abnormal results are displayed) Labs Reviewed  CBC WITH DIFFERENTIAL/PLATELET - Abnormal; Notable for the following:       Result Value   WBC 252.0 (*)    Hemoglobin 11.2 (*)    HCT 34.9 (*)    RDW 14.6 (*)    Neutro Abs 20.2 (*)    Lymphs Abs 226.8 (*)    Monocytes Absolute 5.0 (*)    All other components within normal limits  URINALYSIS COMPLETEWITH MICROSCOPIC ([facility] ONLY) - Abnormal; Notable for the following:    APPearance HAZY (*)    Ketones, ur 15 (*)    Hgb urine dipstick MODERATE (*)    Leukocytes, UA SMALL (*)    Bacteria, UA FEW (*)    All other components within normal limits    EKG  EKG Interpretation None       Radiology No results found.  Procedures Procedures (including critical care time)  Medications Ordered in UC Medications - No data to display   Initial Impression / Assessment and Plan / UC Course  I have reviewed the triage vital signs and the nursing notes.  Pertinent labs & imaging results that were available during my care of the patient were reviewed by me and considered in my medical decision making (see chart for details).  Clinical Course    Discussed approach to new onset vaginal bleeding. Unclear if the patient is actually postmenopausal. Informed her that vaginal ultrasound would be necessary but not urgent tonight. We agreed to check platelet count, hemoglobin and urinalysis. The latter shows blood and bacteria as well as leukocytes as would be expected in a blood contaminated sample. It is nitrite  negative but we will treat empirically as urinary tract infection. Platelets normal. Hemoglobin stable. Urged patient to visit her gynecologist for evaluation of postmenopausal bleeding or obtain referral for vaginal ultrasound from her primary care doctor as soon as possible.  Final Clinical Impressions(s) / UC Diagnoses   Final diagnoses:  UTI (lower urinary tract infection)    New Prescriptions Discharge Medication List as of 04/28/2016  8:24 PM  START taking these medications   Details  oxyCODONE-acetaminophen (PERCOCET/ROXICET) 5-325 MG tablet Take 1 tablet by mouth every 4 (four) hours as needed for severe pain., Starting Mon 04/28/2016, Until Sat 05/03/2016, Print    sulfamethoxazole-trimethoprim (BACTRIM DS,SEPTRA DS) 800-160 MG tablet Take 1 tablet by mouth 2 (two) times daily., Starting Mon 04/28/2016, Until Mon 05/05/2016, Normal         Harrie Foreman, MD 04/29/16 [phone]

## 2016-04-29 NOTE — Telephone Encounter (Signed)
Cathy [name] can see her tomorrow at 2, she has agreed to this appt and will be there at 130 to fill out paperwork

## 2016-04-30 ENCOUNTER — Ambulatory Visit (INDEPENDENT_AMBULATORY_CARE_PROVIDER_SITE_OTHER): Payer: Managed Care, Other (non HMO) | Admitting: Obstetrics and Gynecology

## 2016-04-30 ENCOUNTER — Encounter: Payer: Self-pay | Admitting: Obstetrics and Gynecology

## 2016-04-30 VITALS — BP 156/63 | HR 76 | Ht 65.5 in | Wt 138.0 lb

## 2016-04-30 DIAGNOSIS — N949 Unspecified condition associated with female genital organs and menstrual cycle: Secondary | ICD-10-CM

## 2016-04-30 DIAGNOSIS — C9110 Chronic lymphocytic leukemia of B-cell type not having achieved remission: Secondary | ICD-10-CM

## 2016-04-30 DIAGNOSIS — R109 Unspecified abdominal pain: Secondary | ICD-10-CM

## 2016-04-30 DIAGNOSIS — N921 Excessive and frequent menstruation with irregular cycle: Secondary | ICD-10-CM | POA: Diagnosis not present

## 2016-04-30 DIAGNOSIS — N9489 Other specified conditions associated with female genital organs and menstrual cycle: Secondary | ICD-10-CM | POA: Insufficient documentation

## 2016-04-30 NOTE — Patient Instructions (Addendum)
1. Endometrial biopsy is done today 2. Pelvic ultrasound is ordered 3. Follow-up 1 week after ultrasound for further management planning  ENDOMETRIAL BIOPSY POST-PROCEDURE INSTRUCTIONS  1. You may take Ibuprofen, Aleve or Tylenol for pain if needed.  Cramping should resolve within in 24 hours.  2. You may have a small amount of spotting.  You should wear a mini pad for the next few days.  3. You may have intercourse after 24 hours.  4. You need to call if you have any pelvic pain, fever, heavy bleeding or foul smelling vaginal discharge.  5. Shower or bathe as normal  6. We will call you within one week with results or we will discuss   the results at your follow-up appointment if needed.

## 2016-04-30 NOTE — Progress Notes (Signed)
GYN ENCOUNTER NOTE  Subjective:       Cathy [name] is a 60 y.o. [phone] female is here for gynecologic evaluation of the following issues:  1. Menorrhagia  History of CLL on chemotherapy-IMBRUVICA History of irregular menstrual cycles through June 2017; amenorrhea in July and August; New onset menorrhagia over past 8 days associated with pelvic cramping.   Patient states that she felt a right lower pelvic mass in the shower earlier today and when she pressed on the mass, she experienced expulsion of blood from the vagina(and the mass went away)   CT scan in July 2016 was negative for pelvic pathology  Patient has not seen a gynecologist since birth of her children.  Gynecologic History Patient's last menstrual period was 04/21/2016. Contraception: tubal ligation Last Pap: No recent Pap Last mammogram: No recent mammogram  Obstetric History OB History  Gravida Para Term Preterm AB Living  3 3 3     3   SAB TAB Ectopic Multiple Live Births          3    # Outcome Date GA Lbr Len/2nd Weight Sex Delivery Anes PTL Lv  3 Term 1990   7 lb (3.175 kg) F Vag-Spont   LIV  2 Term 1989   7 lb (3.175 kg) F Vag-Spont   LIV  1 Term 1987   8 lb 1.8 oz (3.679 kg) M Vag-Spont   LIV      Past Medical History:  Diagnosis Date  . Chest pain   . CLL (chronic lymphocytic leukemia) ([place])   . Hyperlipidemia     Past Surgical History:  Procedure Laterality Date  . TUBAL LIGATION      Current Outpatient Prescriptions on File Prior to Visit  Medication Sig Dispense Refill  . ibrutinib (IMBRUVICA) 140 MG capsul Take 3 capsules (420 mg total) by mouth daily. 90 capsule 5  . oxyCODONE-acetaminophen (PERCOCET/ROXICET) 5-325 MG tablet Take 1 tablet by mouth every 4 (four) hours as needed for severe pain. 15 tablet 0  . sulfamethoxazole-trimethoprim (BACTRIM DS,SEPTRA DS) 800-160 MG tablet Take 1 tablet by mouth 2 (two) times daily. 14 tablet 0   No current facility-administered  medications on file prior to visit.     No Known Allergies  Social History   Social History  . Marital status: Legally Separated    Spouse name: N/A  . Number of children: N/A  . Years of education: N/A   Occupational History  . Not on file.   Social History Main Topics  . Smoking status: Never Smoker  . Smokeless tobacco: Never Used  . Alcohol use No  . Drug use: No  . Sexual activity: Not Currently    Birth control/ protection: Surgical   Other Topics Concern  . Not on file   Social History Narrative  . No narrative on file    Family History  Problem Relation Age of Onset  . Stroke Mother 12    CVA  . Diabetes Mother   . Deep vein thrombosis Brother 45    recurrent DVTs.  . Stroke Maternal Grandmother   . Colon cancer Maternal Grandmother   . Stroke Maternal Grandfather   . Diabetes Maternal Grandfather   . Diabetes Sister   . Breast cancer Neg Hx   . Ovarian cancer Neg Hx     The following portions of the patient's history were reviewed and updated as appropriate: allergies, current medications, past family history, past medical history, past social history, past surgical  history and problem list.  Review of Systems Review of Systems - Per history of present illness Objective:   BP (!) 156/63   Pulse 76   Ht 5' 5.5" (1.664 m)   Wt 138 lb (62.6 kg)   LMP 04/21/2016   BMI 22.62 kg/m  CONSTITUTIONAL: Well-developed, well-nourished female in no acute distress.  HENT:  Normocephalic, atraumatic.  NECK: Normal range of motion, supple, no masses.  Normal thyroid.  SKIN: Skin is warm and dry. No rash noted. Not diaphoretic. No erythema. No pallor. [place]: Alert and oriented to person, place, and time. PSYCHIATRIC: Normal mood and affect. Normal behavior. Normal judgment and thought content. CARDIOVASCULAR:Not Examined RESPIRATORY: Not Examined BREASTS: Not Examined ABDOMEN: Soft, non distended; Non tender.  No Organomegaly. PELVIC:  External  Genitalia: Normal; blood tinged perineum  BUS: Normal  Vagina: Normal; burgundy non-clotted blood in vaginal vault  Cervix: Scarred with scarring noted along the left vaginal sidewall pulling os to left; no cervical motion tenderness; no lesions; os stenotic  Uterus: Mid plane, mobile, nontender; right adnexal mass coming off uterine cornua, unclear if fibroid versus right adnexal mass  Adnexa: Possible right adnexal mass 5 cm  RV: Normal external exam  Bladder: Nontender MUSCULOSKELETAL: Normal range of motion. No tenderness.  No cyanosis, clubbing, or edema.  PROCEDURE: Endometrial biopsy with endocervical canal dilation Endometrial Biopsy Procedure Note  Pre-operative Diagnosis: Menorrhagia  Post-operative Diagnosis: Same  Procedure Details   Urine pregnancy test was not done.  The risks (including infection, bleeding, pain, and uterine perforation) and benefits of the procedure were explained to the patient and Verbal informed consent was obtained.  Antibiotic prophylaxis against endocarditis was not indicated.   The patient was placed in the dorsal lithotomy position.  Bimanual exam showed the uterus to be in the neutral position.  A Graves' speculum inserted in the vagina.  Endocervical curettage with a Kevorkian curette was not performed. Cervical stenosis was noted. A sharp tenaculum was applied to the anterior lip of the cervix for stabilization. The endocervical canal was dilated using lacrimal duct probes.    A sterile uterine sound was used to sound the uterus to a depth of 7cm.  A Mylex 67mm curette was used to sample the endometrium.  Sample was sent for pathologic examination.  Condition: Stable  Complications: None  Plan:  The patient was advised to call for any fever or for prolonged or severe pain or bleeding. She was advised to use OTC acetaminophen and OTC ibuprofen as needed for mild to moderate pain. She was advised to avoid vaginal intercourse for 48 hours or  until the bleeding has completely stopped.  Attending Physician Documentation: Brayton Mars, MD      Assessment:   1. Menorrhagia with irregular cycle - US Pelvis Complete; Future - US Transvaginal Non-OB; Future  2. Right adnexal mass  3. CLL  4. Abdominal cramping     Plan:   1. Endometrial biopsy done 2. Pelvic ultrasound ordered 3. Return in 1 week after ultrasound for further management planning  Brayton Mars, MD  Note: This dictation was prepared with Dragon dictation along with smaller phrase technology. Any transcriptional errors that result from this process are unintentional.

## 2016-05-02 LAB — PATHOLOGY

## 2016-05-06 ENCOUNTER — Ambulatory Visit (INDEPENDENT_AMBULATORY_CARE_PROVIDER_SITE_OTHER): Payer: Managed Care, Other (non HMO)

## 2016-05-06 DIAGNOSIS — N921 Excessive and frequent menstruation with irregular cycle: Secondary | ICD-10-CM | POA: Diagnosis not present

## 2016-05-14 ENCOUNTER — Inpatient Hospital Stay: Payer: Managed Care, Other (non HMO) | Attending: Oncology

## 2016-05-14 ENCOUNTER — Telehealth: Payer: Self-pay | Admitting: Obstetrics and Gynecology

## 2016-05-14 DIAGNOSIS — C9110 Chronic lymphocytic leukemia of B-cell type not having achieved remission: Secondary | ICD-10-CM | POA: Diagnosis not present

## 2016-05-14 LAB — COMPREHENSIVE METABOLIC PANEL
ALT: 13 U/L — ABNORMAL LOW (ref 14–54)
AST: 16 U/L (ref 15–41)
Albumin: 4 g/dL (ref 3.5–5.0)
Alkaline Phosphatase: 41 U/L (ref 38–126)
Anion gap: 5 (ref 5–15)
BILIRUBIN TOTAL: 0.6 mg/dL (ref 0.3–1.2)
BUN: 12 mg/dL (ref 6–20)
CO2: 25 mmol/L (ref 22–32)
Calcium: 8.5 mg/dL — ABNORMAL LOW (ref 8.9–10.3)
Chloride: 107 mmol/L (ref 101–111)
Creatinine, Ser: 0.81 mg/dL (ref 0.44–1.00)
GFR calc Af Amer: >60 mL/min (ref >60)
Glucose, Bld: 124 mg/dL — ABNORMAL HIGH (ref 65–99)
POTASSIUM: 3.6 mmol/L (ref 3.5–5.1)
Sodium: 137 mmol/L (ref 135–145)
Total Protein: 6.5 g/dL (ref 6.5–8.1)

## 2016-05-14 LAB — CBC WITH DIFFERENTIAL/PLATELET
BASOS ABS: 0 10*3/uL (ref 0–0.1)
BASOS PCT: 0 %
EOS PCT: 0 %
Eosinophils Absolute: 0 10*3/uL (ref 0–0.7)
HEMATOCRIT: 32.5 % — AB (ref 35.0–47.0)
Hemoglobin: 10.7 g/dL — ABNORMAL LOW (ref 12.0–16.0)
LYMPHS PCT: 97 %
Lymphs Abs: 206.1 10*3/uL — ABNORMAL HIGH (ref 1.0–3.6)
MCH: 28.2 pg (ref 26.0–34.0)
MCHC: 32.8 g/dL (ref 32.0–36.0)
MCV: 85.8 fL (ref 80.0–100.0)
MONOS PCT: 2 %
Monocytes Absolute: 4.3 10*3/uL — ABNORMAL HIGH (ref 0.2–0.9)
NEUTROS ABS: 2.1 10*3/uL (ref 1.4–6.5)
Neutrophils Relative %: 1 %
Platelets: 261 10*3/uL (ref 150–440)
RBC: 3.79 MIL/uL — ABNORMAL LOW (ref 3.80–5.20)
RDW: 13.7 % (ref 11.5–14.5)
WBC: 212.5 10*3/uL (ref 3.6–11.0)

## 2016-05-14 LAB — PHOSPHORUS: PHOSPHORUS: 3.6 mg/dL (ref 2.5–4.6)

## 2016-05-14 LAB — MAGNESIUM: MAGNESIUM: 1.9 mg/dL (ref 1.7–2.4)

## 2016-05-14 NOTE — Telephone Encounter (Signed)
PT CALLED AND SHE IS WANTING TO KNOW THE RESULTS OF HER [place] AND BX PATHOLOGY.

## 2016-05-14 NOTE — Telephone Encounter (Signed)
Pls review u/s and EMB. ty

## 2016-05-15 NOTE — Telephone Encounter (Signed)
lmtrc

## 2016-05-16 NOTE — Telephone Encounter (Signed)
lmtrc

## 2016-05-19 ENCOUNTER — Telehealth: Payer: Self-pay | Admitting: *Deleted

## 2016-05-19 NOTE — Telephone Encounter (Signed)
Pt aware. appt made 10/3.

## 2016-05-19 NOTE — Telephone Encounter (Signed)
She is going to be scheduled for a histoscopy and a D & C. She has concerns regarding having this done with the med she is on. Please advise

## 2016-05-19 NOTE — Telephone Encounter (Signed)
Per Dr Grayland Ormond OK to proceed as long as her plt is nml.

## 2016-05-20 ENCOUNTER — Ambulatory Visit (INDEPENDENT_AMBULATORY_CARE_PROVIDER_SITE_OTHER): Payer: Managed Care, Other (non HMO) | Admitting: Obstetrics and Gynecology

## 2016-05-20 VITALS — BP 123/64 | HR 76 | Ht 65.5 in | Wt 140.3 lb

## 2016-05-20 DIAGNOSIS — N921 Excessive and frequent menstruation with irregular cycle: Secondary | ICD-10-CM | POA: Diagnosis not present

## 2016-05-20 DIAGNOSIS — R938 Abnormal findings on diagnostic imaging of other specified body structures: Secondary | ICD-10-CM

## 2016-05-20 DIAGNOSIS — D259 Leiomyoma of uterus, unspecified: Secondary | ICD-10-CM | POA: Diagnosis not present

## 2016-05-20 DIAGNOSIS — R9389 Abnormal findings on diagnostic imaging of other specified body structures: Secondary | ICD-10-CM | POA: Insufficient documentation

## 2016-05-20 NOTE — Patient Instructions (Signed)
1. Hysteroscopy/D&C is scheduled 2. Return for preoperative appointment week before surgery  Dilation and Curettage or Vacuum Curettage Dilation and curettage (D&C) and vacuum curettage are minor procedures. A D&C involves stretching (dilation) the cervix and scraping (curettage) the inside lining of the womb (uterus). During a D&C, tissue is gently scraped from the inside lining of the uterus. During a vacuum curettage, the lining and tissue in the uterus are removed with the use of gentle suction.  Curettage may be performed to either diagnose or treat a problem. As a diagnostic procedure, curettage is performed to examine tissues from the uterus. A diagnostic curettage may be performed for the following symptoms:   Irregular bleeding in the uterus.   Bleeding with the development of clots.   Spotting between menstrual periods.   Prolonged menstrual periods.   Bleeding after menopause.   No menstrual period (amenorrhea).   A change in size and shape of the uterus.  As a treatment procedure, curettage may be performed for the following reasons:   Removal of an IUD (intrauterine device).   Removal of retained placenta after giving birth. Retained placenta can cause an infection or bleeding severe enough to require transfusions.   Abortion.   Miscarriage.   Removal of polyps inside the uterus.   Removal of uncommon types of noncancerous lumps (fibroids).  LET [facility] CARE PROVIDER KNOW ABOUT:   Any allergies you have.   All medicines you are taking, including vitamins, herbs, eye drops, creams, and over-the-counter medicines.   Previous problems you or members of your family have had with the use of anesthetics.   Any blood disorders you have.   Previous surgeries you have had.   Medical conditions you have. RISKS AND COMPLICATIONS  Generally, this is a safe procedure. However, as with any procedure, complications can occur. Possible complications  include:  Excessive bleeding.   Infection of the uterus.   Damage to the cervix.   Development of scar tissue (adhesions) inside the uterus, later causing abnormal amounts of menstrual bleeding.   Complications from the general anesthetic, if a general anesthetic is used.   Putting a hole (perforation) in the uterus. This is rare.  BEFORE THE PROCEDURE   Eat and drink before the procedure only as directed by your health care provider.   Arrange for someone to take you home.  PROCEDURE  This procedure usually takes about 15-30 minutes.  You will be given one of the following:  A medicine that numbs the area in and around the cervix (local anesthetic).   A medicine to make you sleep through the procedure (general anesthetic).  You will lie on your back with your legs in stirrups.   A warm metal or plastic instrument (speculum) will be placed in your vagina to keep it open and to allow the health care provider to see the cervix.  There are two ways in which your cervix can be softened and dilated. These include:   Taking a medicine.   Having thin rods (laminaria) inserted into your cervix.   A curved tool (curette) will be used to scrape cells from the inside lining of the uterus. In some cases, gentle suction is applied with the curette. The curette will then be removed.  AFTER THE PROCEDURE   You will rest in the recovery area until you are stable and are ready to go home.   You may feel sick to your stomach (nauseous) or throw up (vomit) if you were given a  general anesthetic.   You may have a sore throat if a tube was placed in your throat during general anesthesia.   You may have light cramping and bleeding. This may last for 2 days to 2 weeks after the procedure.   Your uterus needs to make a new lining after the procedure. This may make your next period late.   This information is not intended to replace advice given to you by your health care  provider. Make sure you discuss any questions you have with your health care provider.   Document Released: 08/04/2005 Document Revised: 04/06/2013 Document Reviewed: 03/03/2013 Elsevier Interactive Patient Education [employer].

## 2016-05-20 NOTE — Progress Notes (Signed)
Chief complaint: 1. Menorrhagia with irregular cycle 2. Follow-up on ultrasound 3. Follow-up on endometrial biopsy  Patient presents for management planning.  Endometrial biopsy-insufficient for diagnosis Diagnosis:  ENDOMETRIUM, BIOPSY:  BLOOD WITH RARE ENDOMETRIAL FRAGMENTS, INSUFFICIENT FOR DIAGNOSIS.  BUL/05/02/2016   Ultrasound: 1. Fibroid uterus-3 cm, 2.8 cm 2. Thickened inhomogeneous endometrium.  8.4 mm 3. Simple right ovarian cyst 2.1 cm  OBJECTIVE: BP 123/64   Pulse 76   Ht 5' 5.5" (1.664 m)   Wt 140 lb 4.8 oz (63.6 kg)   LMP 04/21/2016 (Exact Date)   BMI 22.99 kg/m  Physical exam-deferred  ASSESSMENT: 1. Fibroid uterus 2. Menorrhagia with regular cycle 3. Thickened endometrium on ultrasound 4. Endometrial biopsy inconclusive  PLAN: 1. Hysteroscopy/D&C 2. Return for preop appointment week prior to surgery  A total of 15 minutes were spent face-to-face with the patient during this encounter and over half of that time dealt with counseling and coordination of care.  Brayton Mars, MD  Note: This dictation was prepared with Dragon dictation along with smaller phrase technology. Any transcriptional errors that result from this process are unintentional.

## 2016-05-28 ENCOUNTER — Ambulatory Visit (INDEPENDENT_AMBULATORY_CARE_PROVIDER_SITE_OTHER): Payer: Managed Care, Other (non HMO) | Admitting: Obstetrics and Gynecology

## 2016-05-28 VITALS — BP 114/67 | HR 73 | Ht 65.5 in | Wt 138.8 lb

## 2016-05-28 DIAGNOSIS — N921 Excessive and frequent menstruation with irregular cycle: Secondary | ICD-10-CM

## 2016-05-28 DIAGNOSIS — Z01810 Encounter for preprocedural cardiovascular examination: Secondary | ICD-10-CM | POA: Diagnosis not present

## 2016-05-28 DIAGNOSIS — C9110 Chronic lymphocytic leukemia of B-cell type not having achieved remission: Secondary | ICD-10-CM

## 2016-05-28 DIAGNOSIS — R9389 Abnormal findings on diagnostic imaging of other specified body structures: Secondary | ICD-10-CM

## 2016-05-28 DIAGNOSIS — Z01812 Encounter for preprocedural laboratory examination: Secondary | ICD-10-CM | POA: Diagnosis present

## 2016-05-28 DIAGNOSIS — D72829 Elevated white blood cell count, unspecified: Secondary | ICD-10-CM | POA: Diagnosis not present

## 2016-05-28 DIAGNOSIS — R938 Abnormal findings on diagnostic imaging of other specified body structures: Secondary | ICD-10-CM

## 2016-05-28 DIAGNOSIS — Z01818 Encounter for other preprocedural examination: Secondary | ICD-10-CM

## 2016-05-28 LAB — CBC WITH DIFFERENTIAL/PLATELET
BASOS ABS: 0 10*3/uL (ref 0–0.1)
Basophils Relative: 0 %
EOS PCT: 0 %
Eosinophils Absolute: 0 10*3/uL (ref 0–0.7)
HEMATOCRIT: 30 % — AB (ref 35.0–47.0)
HEMOGLOBIN: 9.9 g/dL — AB (ref 12.0–16.0)
LYMPHS PCT: 95 %
Lymphs Abs: 210.2 10*3/uL — ABNORMAL HIGH (ref 1.0–3.6)
MCH: 28.3 pg (ref 26.0–34.0)
MCHC: 32.9 g/dL (ref 32.0–36.0)
MCV: 86 fL (ref 80.0–100.0)
MONOS PCT: 1 %
Monocytes Absolute: 2.2 10*3/uL — ABNORMAL HIGH (ref 0.2–0.9)
NEUTROS PCT: 4 %
Neutro Abs: 8.8 10*3/uL — ABNORMAL HIGH (ref 1.4–6.5)
Platelets: 216 10*3/uL (ref 150–440)
RBC: 3.49 MIL/uL — AB (ref 3.80–5.20)
RDW: 14.4 % (ref 11.5–14.5)
WBC: 221.2 10*3/uL — AB (ref 3.6–11.0)

## 2016-05-28 LAB — RAPID HIV SCREEN (HIV 1/2 AB+AG)
HIV 1/2 Antibodies: NONREACTIVE
HIV-1 P24 ANTIGEN - HIV24: NONREACTIVE

## 2016-05-28 NOTE — Pre-Procedure Instructions (Signed)
Received call from lab, label on smeared, need to redraw.  Patient notified.  States will go to main lab after 5pm today.

## 2016-05-28 NOTE — Patient Instructions (Signed)
  Your procedure is scheduled on: 06/02/16 Mon Report to Same Day Surgery 2nd floor medical mall To find out your arrival time please call [phone] between 1PM - 3PM on 05/30/16 Fri  Remember: Instructions that are not followed completely may result in serious medical risk, up to and including death, or upon the discretion of your surgeon and anesthesiologist your surgery may need to be rescheduled.    _x___ 1. Do not eat food or drink liquids after midnight. No gum chewing or hard candies.     __x__ 2. No Alcohol for 24 hours before or after surgery.   __x__3. No Smoking for 24 prior to surgery.   ____  4. Bring all medications with you on the day of surgery if instructed.    __x__ 5. Notify your doctor if there is any change in your medical condition     (cold, fever, infections).     Do not wear jewelry, make-up, hairpins, clips or nail polish.  Do not wear lotions, powders, or perfumes. You may wear deodorant.  Do not shave 48 hours prior to surgery. Men may shave face and neck.  Do not bring valuables to the hospital.    [facility] is not responsible for any belongings or valuables.               Contacts, dentures or bridgework may not be worn into surgery.  Leave your suitcase in the car. After surgery it may be brought to your room.  For patients admitted to the hospital, discharge time is determined by your treatment team.   Patients discharged the day of surgery will not be allowed to drive home.    Please read over the following fact sheets that you were given:   [facility] Pre[facility] for [facility] and or MRSA Information   _x___ Take these medicines the morning of surgery with A SIP OF WATER:    1. ibrutinib (IMBRUVICA) 140 MG capsul  2.  3.  4.  5.  6.  ____Fleets enema or Magnesium Citrate as directed.   _x___ Use CHG Soap or sage wipes as directed on instruction sheet   ____ Use inhalers on the day of surgery and bring to hospital day of  surgery  ____ Stop metformin 2 days prior to surgery    ____ Take 1/2 of usual insulin dose the night before surgery and none on the morning of           surgery.   ____ Stop aspirin or coumadin, or plavix  x__ Stop Anti-inflammatories such as Advil, Aleve, Ibuprofen, Motrin, Naproxen,          Naprosyn, Goodies powders or aspirin products. Ok to take Tylenol.   ____ Stop supplements until after surgery.    ____ Bring C-Pap to the hospital.

## 2016-05-28 NOTE — Progress Notes (Signed)
Subjective:  PREOPERATIVE history HISTORY AND PHYSICAL     Date of surgery: 06/02/2016 Procedure: Hysteroscopy/D&C   Patient is a 60 y.o. G3P3038female scheduled for hysteroscopy/D&C for evaluation of menorrhagia. Preoperative endometrial biopsy showed blood with rare endometrial fragments, insufficient for diagnosis. 05/06/2016 ultrasound demonstrated thickened endometrium measuring 8.4 mm with in homogenous texture. Simple right ovarian cyst was noted measuring 2.1 cm. 2 fibroids measuring 2.9 and 2.8 cm respectively were identified   Menstrual History: OB History    Gravida Para Term Preterm AB Living   3 3 3     3    SAB TAB Ectopic Multiple Live Births           53      Menarche age: NA Patient's last menstrual period was 05/18/2016 (approximate).    Past Medical History:  Diagnosis Date  . Chest pain   . CLL (chronic lymphocytic leukemia) ([place])   . Hyperlipidemia     Past Surgical History:  Procedure Laterality Date  . TUBAL LIGATION      OB History  Gravida Para Term Preterm AB Living  3 3 3     3   SAB TAB Ectopic Multiple Live Births          3    # Outcome Date GA Lbr Len/2nd Weight Sex Delivery Anes PTL Lv  3 Term 1990   7 lb (3.175 kg) F Vag-Spont   LIV  2 Term 1989   7 lb (3.175 kg) F Vag-Spont   LIV  1 Term 1987   8 lb 1.8 oz (3.679 kg) M Vag-Spont   LIV      Social History   Social History  . Marital status: Legally Separated    Spouse name: N/A  . Number of children: N/A  . Years of education: N/A   Social History Main Topics  . Smoking status: Never Smoker  . Smokeless tobacco: Never Used  . Alcohol use No  . Drug use: No  . Sexual activity: Not Currently    Birth control/ protection: Surgical   Other Topics Concern  . Not on file   Social History Narrative  . No narrative on file    Family History  Problem Relation Age of Onset  . Stroke Mother 28    CVA  . Diabetes Mother   . Deep vein thrombosis Brother 45    recurrent DVTs.   . Stroke Maternal Grandmother   . Colon cancer Maternal Grandmother   . Stroke Maternal Grandfather   . Diabetes Maternal Grandfather   . Diabetes Sister   . Breast cancer Neg Hx   . Ovarian cancer Neg Hx      (Not in a hospital admission)  No Known Allergies  Review of Systems Constitutional: No recent fever/chills/sweats Respiratory: No recent cough/bronchitis Cardiovascular: No chest pain Gastrointestinal: No recent nausea/vomiting/diarrhea Genitourinary: No UTI symptoms Hematologic/lymphatic:No history of coagulopathy or recent blood thinner use    Objective:    BP 114/67   Pulse 73   Ht 5' 5.5" (1.664 m)   Wt 138 lb 12.8 oz (63 kg)   LMP 05/18/2016 (Approximate) Comment: LITE BLEEDING  BMI 22.75 kg/m   General:   Normal  Skin:   normal  HEENT:  Normal  Neck:  Supple without Adenopathy or Thyromegaly  Lungs:   Heart:              Breasts:   Abdomen:  Pelvis:  M/S   Extremeties:  Neuro:    clear  to auscultation bilaterally   Normal without murmur   Not Examined   soft, non-tender; bowel sounds normal; no masses,  no organomegaly   Exam deferred to OR  No CVAT  Warm/Dry   Normal        Assessment:    1. Menorrhagia with regular cycle  2. Endometrial biopsy insufficient for diagnosis 3. Fibroid uterus 4. Thickened endometrium on ultrasound Plan:  Hysteroscopy/D&C  Preoperative counseling: Patient is to undergo hysteroscopy/D&C on 06/02/2016. She is understanding of the planned procedure and is aware of and is accepting of all surgical risks which include but are not limited to bleeding, infection, pelvic organ injury with need for repair, blood clot disorders, anesthesia risks, etc. All questions were answered. Informed consent is given. Patient is ready and willing to proceed with surgery as scheduled.  Brayton Mars, MD  Note: This dictation was prepared with Dragon dictation along with smaller phrase technology. Any transcriptional  errors that result from this process are unintentional.

## 2016-05-28 NOTE — Patient Instructions (Signed)
Return in 1 week for postop check. 

## 2016-05-28 NOTE — H&P (Signed)
Subjective:  PREOPERATIVE history HISTORY AND PHYSICAL     Date of surgery: 06/02/2016 Procedure: Stroboscopy/D&C   Patient is a 60 y.o. G3P3072female scheduled for hysteroscopy/D&C for evaluation of menorrhagia. Preoperative endometrial biopsy showed blood with rare endometrial fragments, insufficient for diagnosis. 05/06/2016 ultrasound demonstrated thickened endometrium measuring 8.4 mm with in homogenous texture. Simple right ovarian cyst was noted measuring 2.1 cm. 2 fibroids measuring 2.9 and 2.8 cm respectively were identified   Menstrual History: OB History    Gravida Para Term Preterm AB Living   3 3 3     3    SAB TAB Ectopic Multiple Live Births           68      Menarche age: NA Patient's last menstrual period was 05/18/2016 (approximate).    Past Medical History:  Diagnosis Date  . Chest pain   . CLL (chronic lymphocytic leukemia) ([place])   . Hyperlipidemia     Past Surgical History:  Procedure Laterality Date  . TUBAL LIGATION      OB History  Gravida Para Term Preterm AB Living  3 3 3     3   SAB TAB Ectopic Multiple Live Births          3    # Outcome Date GA Lbr Len/2nd Weight Sex Delivery Anes PTL Lv  3 Term 1990   7 lb (3.175 kg) F Vag-Spont   LIV  2 Term 1989   7 lb (3.175 kg) F Vag-Spont   LIV  1 Term 1987   8 lb 1.8 oz (3.679 kg) M Vag-Spont   LIV      Social History   Social History  . Marital status: Legally Separated    Spouse name: N/A  . Number of children: N/A  . Years of education: N/A   Social History Main Topics  . Smoking status: Never Smoker  . Smokeless tobacco: Never Used  . Alcohol use No  . Drug use: No  . Sexual activity: Not Currently    Birth control/ protection: Surgical   Other Topics Concern  . Not on file   Social History Narrative  . No narrative on file    Family History  Problem Relation Age of Onset  . Stroke Mother 77    CVA  . Diabetes Mother   . Deep vein thrombosis Brother 45    recurrent DVTs.   . Stroke Maternal Grandmother   . Colon cancer Maternal Grandmother   . Stroke Maternal Grandfather   . Diabetes Maternal Grandfather   . Diabetes Sister   . Breast cancer Neg Hx   . Ovarian cancer Neg Hx      (Not in a hospital admission)  No Known Allergies  Review of Systems Constitutional: No recent fever/chills/sweats Respiratory: No recent cough/bronchitis Cardiovascular: No chest pain Gastrointestinal: No recent nausea/vomiting/diarrhea Genitourinary: No UTI symptoms Hematologic/lymphatic:No history of coagulopathy or recent blood thinner use    Objective:    BP 114/67   Pulse 73   Ht 5' 5.5" (1.664 m)   Wt 138 lb 12.8 oz (63 kg)   LMP 05/18/2016 (Approximate) Comment: LITE BLEEDING  BMI 22.75 kg/m   General:   Normal  Skin:   normal  HEENT:  Normal  Neck:  Supple without Adenopathy or Thyromegaly  Lungs:   Heart:              Breasts:   Abdomen:  Pelvis:  M/S   Extremeties:  Neuro:    clear  to auscultation bilaterally   Normal without murmur   Not Examined   soft, non-tender; bowel sounds normal; no masses,  no organomegaly   Exam deferred to OR  No CVAT  Warm/Dry   Normal        Assessment:    1. Menorrhagia with regular cycle  2. Endometrial biopsy insufficient for diagnosis 3. Fibroid uterus 4. Thickened endometrium on ultrasound Plan:  Hysteroscopy/D&C  Preoperative counseling: Patient is to undergo hysteroscopy/D&C on 06/02/2016. She is understanding of the planned procedure and is aware of and is accepting of all surgical risks which include but are not limited to bleeding, infection, pelvic organ injury with need for repair, blood clot disorders, anesthesia risks, etc. All questions were answered. Informed consent is given. Patient is ready and willing to proceed with surgery as scheduled.  Brayton Mars, MD  Note: This dictation was prepared with Dragon dictation along with smaller phrase technology. Any transcriptional  errors that result from this process are unintentional.

## 2016-05-29 ENCOUNTER — Encounter
Admission: RE | Admit: 2016-05-29 | Discharge: 2016-05-29 | Disposition: A | Discharge status: Home / Self Care | Payer: Managed Care, Other (non HMO) | Source: Ambulatory Visit | Attending: Obstetrics and Gynecology | Admitting: Obstetrics and Gynecology

## 2016-05-29 DIAGNOSIS — Z01810 Encounter for preprocedural cardiovascular examination: Secondary | ICD-10-CM | POA: Insufficient documentation

## 2016-05-29 DIAGNOSIS — D72829 Elevated white blood cell count, unspecified: Secondary | ICD-10-CM | POA: Insufficient documentation

## 2016-05-29 DIAGNOSIS — Z01812 Encounter for preprocedural laboratory examination: Secondary | ICD-10-CM | POA: Insufficient documentation

## 2016-05-29 HISTORY — DX: Adverse effect of unspecified anesthetic, initial encounter: T41.45XA

## 2016-05-29 HISTORY — DX: Other complications of anesthesia, initial encounter: T88.59XA

## 2016-05-30 LAB — HIV ANTIBODY (ROUTINE TESTING W REFLEX): HIV SCREEN 4TH GENERATION: NONREACTIVE

## 2016-05-30 LAB — RPR: RPR Ser Ql: NONREACTIVE

## 2016-06-02 ENCOUNTER — Ambulatory Visit: Payer: Managed Care, Other (non HMO) | Admitting: Anesthesiology

## 2016-06-02 ENCOUNTER — Encounter
Admission: RE | Disposition: A | Discharge status: Home / Self Care | Payer: Self-pay | Source: Ambulatory Visit | Attending: Obstetrics and Gynecology

## 2016-06-02 ENCOUNTER — Encounter: Payer: Self-pay | Admitting: *Deleted

## 2016-06-02 ENCOUNTER — Ambulatory Visit
Admission: RE | Admit: 2016-06-02 | Discharge: 2016-06-02 | Disposition: A | Discharge status: Home / Self Care | Payer: Managed Care, Other (non HMO) | Source: Ambulatory Visit | Attending: Obstetrics and Gynecology | Admitting: Obstetrics and Gynecology

## 2016-06-02 DIAGNOSIS — Z9889 Other specified postprocedural states: Secondary | ICD-10-CM

## 2016-06-02 DIAGNOSIS — R9389 Abnormal findings on diagnostic imaging of other specified body structures: Secondary | ICD-10-CM

## 2016-06-02 DIAGNOSIS — R938 Abnormal findings on diagnostic imaging of other specified body structures: Secondary | ICD-10-CM | POA: Diagnosis not present

## 2016-06-02 DIAGNOSIS — N950 Postmenopausal bleeding: Secondary | ICD-10-CM | POA: Diagnosis not present

## 2016-06-02 DIAGNOSIS — D259 Leiomyoma of uterus, unspecified: Secondary | ICD-10-CM | POA: Diagnosis not present

## 2016-06-02 DIAGNOSIS — Z79899 Other long term (current) drug therapy: Secondary | ICD-10-CM | POA: Diagnosis not present

## 2016-06-02 DIAGNOSIS — C9110 Chronic lymphocytic leukemia of B-cell type not having achieved remission: Secondary | ICD-10-CM | POA: Insufficient documentation

## 2016-06-02 DIAGNOSIS — N882 Stricture and stenosis of cervix uteri: Secondary | ICD-10-CM | POA: Diagnosis not present

## 2016-06-02 DIAGNOSIS — N921 Excessive and frequent menstruation with irregular cycle: Secondary | ICD-10-CM

## 2016-06-02 DIAGNOSIS — N840 Polyp of corpus uteri: Secondary | ICD-10-CM | POA: Insufficient documentation

## 2016-06-02 HISTORY — PX: HYSTEROSCOPY WITH D & C: SHX1775

## 2016-06-02 LAB — POCT PREGNANCY, URINE: Preg Test, Ur: NEGATIVE

## 2016-06-02 SURGERY — DILATATION AND CURETTAGE /HYSTEROSCOPY
Anesthesia: General

## 2016-06-02 MED ORDER — ACETAMINOPHEN 10 MG/ML IV SOLN
INTRAVENOUS | Status: DC | PRN
  Administered 2016-06-02: 1000 mg via INTRAVENOUS

## 2016-06-02 MED ORDER — EPHEDRINE SULFATE 50 MG/ML IJ SOLN
INTRAMUSCULAR | Status: DC | PRN
  Administered 2016-06-02: 5 mg via INTRAVENOUS
  Administered 2016-06-02: 10 mg via INTRAVENOUS

## 2016-06-02 MED ORDER — ACETAMINOPHEN 10 MG/ML IV SOLN
INTRAVENOUS | Status: AC
  Filled 2016-06-02: qty 100

## 2016-06-02 MED ORDER — PHENYLEPHRINE HCL 10 MG/ML IJ SOLN
INTRAMUSCULAR | Status: DC | PRN
  Administered 2016-06-02: 100 ug via INTRAVENOUS

## 2016-06-02 MED ORDER — PROPOFOL 10 MG/ML IV BOLUS
INTRAVENOUS | Status: DC | PRN
  Administered 2016-06-02: 120 mg via INTRAVENOUS

## 2016-06-02 MED ORDER — LACTATED RINGERS IV SOLN
INTRAVENOUS | Status: DC
  Administered 2016-06-02 (×2): via INTRAVENOUS

## 2016-06-02 MED ORDER — OXYCODONE HCL 5 MG/5ML PO SOLN: 5.0000 mg | Freq: Once | ORAL | Status: DC | PRN

## 2016-06-02 MED ORDER — FENTANYL CITRATE (PF) 100 MCG/2ML IJ SOLN: 25.0000 ug | INTRAMUSCULAR | Status: DC | PRN

## 2016-06-02 MED ORDER — LACTATED RINGERS IV SOLN: INTRAVENOUS | Status: DC

## 2016-06-02 MED ORDER — OXYCODONE-ACETAMINOPHEN 5-325 MG PO TABS: 1.0000 | ORAL_TABLET | ORAL | 0 refills | Status: DC | PRN

## 2016-06-02 MED ORDER — MIDAZOLAM HCL 2 MG/2ML IJ SOLN
INTRAMUSCULAR | Status: DC | PRN
  Administered 2016-06-02: 2 mg via INTRAVENOUS

## 2016-06-02 MED ORDER — ONDANSETRON HCL 4 MG/2ML IJ SOLN
INTRAMUSCULAR | Status: DC | PRN
  Administered 2016-06-02: 4 mg via INTRAVENOUS

## 2016-06-02 MED ORDER — LIDOCAINE HCL (CARDIAC) 20 MG/ML IV SOLN
INTRAVENOUS | Status: DC | PRN
  Administered 2016-06-02: 80 mg via INTRAVENOUS

## 2016-06-02 MED ORDER — OXYCODONE HCL 5 MG PO TABS: 5.0000 mg | ORAL_TABLET | Freq: Once | ORAL | Status: DC | PRN

## 2016-06-02 MED ORDER — FENTANYL CITRATE (PF) 100 MCG/2ML IJ SOLN
INTRAMUSCULAR | Status: DC | PRN
  Administered 2016-06-02 (×2): 25 ug via INTRAVENOUS
  Administered 2016-06-02: 50 ug via INTRAVENOUS

## 2016-06-02 MED ORDER — FAMOTIDINE 20 MG PO TABS
ORAL_TABLET | ORAL | Status: AC
  Filled 2016-06-02: qty 1

## 2016-06-02 MED ORDER — DEXAMETHASONE SODIUM PHOSPHATE 10 MG/ML IJ SOLN
INTRAMUSCULAR | Status: DC | PRN
  Administered 2016-06-02: 10 mg via INTRAVENOUS

## 2016-06-02 MED ORDER — FAMOTIDINE 20 MG PO TABS
20.0000 mg | ORAL_TABLET | Freq: Once | ORAL | Status: AC
  Administered 2016-06-02: 20 mg via ORAL

## 2016-06-02 SURGICAL SUPPLY — 12 items
BAG INFUSER PRESSURE 100CC (MISCELLANEOUS) ×3 IMPLANT
CATH ROBINSON RED A/P 16FR (CATHETERS) ×3 IMPLANT
GLOVE BIO SURGEON STRL SZ8 (GLOVE) ×18 IMPLANT
GOWN STRL REUS W/ TWL LRG LVL3 (GOWN DISPOSABLE) ×1 IMPLANT
GOWN STRL REUS W/ TWL XL LVL3 (GOWN DISPOSABLE) ×1 IMPLANT
GOWN STRL REUS W/TWL LRG LVL3 (GOWN DISPOSABLE) ×2
GOWN STRL REUS W/TWL XL LVL3 (GOWN DISPOSABLE) ×2
IV LACTATED RINGERS 1000ML (IV SOLUTION) ×3 IMPLANT
KIT RM TURNOVER CYSTO AR (KITS) ×3 IMPLANT
PACK DNC HYST (MISCELLANEOUS) ×3 IMPLANT
PAD OB MATERNITY 4.3X12.25 (PERSONAL CARE ITEMS) ×3 IMPLANT
PAD PREP 24X41 OB/GYN DISP (PERSONAL CARE ITEMS) ×3 IMPLANT

## 2016-06-02 NOTE — Op Note (Signed)
OPERATIVE NOTE  Date of surgery: 06/02/2016  Preoperative diagnosis:  1. Postmenopausal bleeding 2. Thickened endometrium 3. CLL  Postoperative diagnosis:  1.Postmenopausal bleeding 2. Thickened endometrium 3. CLL 4. Cervical stenosis  Operative procedure: 1. Hysteroscopy 2. Dilation and curettage of endometrium  Surgeon: Dr. Zipporah Plants Assistant: Alla German, PA-S Anesthesia: Gen.   Findings: Pelvis:gynecoid pelvis; cervical stenosis with scarring;possible endometrial polyps and endometrial synechiae; 8 week size irregular uterus consistent with fibroids; posterior cul-de-sac scarring to cervix  Uterus: sounded to 10 cm Irregular uterine cavity with synechiae and possible polyps  Description of procedure Patient was brought to the operating room where she was placed in supine position. General anesthesia was induced without difficulty using the LMA technique. The patient was placed in dorsal lithotomy position usingbumblebee stirrups. A ChloraPrep perineal intravaginal prep and drape was performed in standard fashion. Weighted speculum was placed in the vagina. Single-tooth tenaculum was placed on the anterior cervix. Uterus was sounded to 10 cm. Hanks dilators were used up to a #20 [place] caliber to dilate the endocervical canal. The [facility] hysteroscope using lactated Ringer's as irrigant was used for the hysteroscopy. The above-noted findings were photo documented. The hysteroscope removed. The smooth and serrated curettes were then used to curettage the endometrial cavity with production of average tissue. Stone polyp forceps were used to grasp residual tissue left behind. Repeat hysteroscopy was performed and demonstrated excellent sampling. Procedure was then terminated with all instrumentation being removed from the vagina. The patient was then awakened mobilized and taken to the recovery room in satisfactory condition.  IV fluids: see anesthesia record- mL. Urine output: 50  mL.  Instruments, needles, and sponge counts were verified as correct.  Brayton Mars, MD 06/02/2016

## 2016-06-02 NOTE — Interval H&P Note (Signed)
History and Physical Interval Note:  06/02/2016 10:44 AM  Cathy [name]  has presented today for surgery, with the diagnosis of menorrhagia, uterine leiomyoma, thickened endometrium  The various methods of treatment have been discussed with the patient and family. After consideration of risks, benefits and other options for treatment, the patient has consented to  Procedure(s): DILATATION AND CURETTAGE /HYSTEROSCOPY (N/A) as a surgical intervention .  The patient's history has been reviewed, patient examined, no change in status, stable for surgery.  I have reviewed the patient's chart and labs.  Questions were answered to the patient's satisfaction.     Hassell Done A Adien Kimmel

## 2016-06-02 NOTE — Anesthesia Preprocedure Evaluation (Addendum)
Anesthesia Evaluation  Patient identified by MRN, date of birth, ID band Patient awake  General Assessment Comment:Woke up from anesthesia 26 years ago with severe muscle cramps...possibly from succynlcholine  Reviewed: Allergy & Precautions, NPO status , Patient's Chart, lab work & pertinent test results  Airway Mallampati: I  TM Distance: >3 FB     Dental no notable dental hx. (+) Chipped   Pulmonary neg pulmonary ROS,    Pulmonary exam normal        Cardiovascular Normal cardiovascular exam     Neuro/Psych negative neurological ROS     GI/Hepatic negative GI ROS, Neg liver ROS,   Endo/Other  negative endocrine ROS  Renal/GU negative Renal ROS   Adnexal mass Abdominal cramping Uterine leiomyoma    Musculoskeletal negative musculoskeletal ROS (+)   Abdominal Normal abdominal exam  (+)   Peds  Hematology Chronic lymphocytic leukemia   Anesthesia Other Findings Past Medical History: No date: Chest pain No date: CLL (chronic lymphocytic leukemia) ([facility]) No date: CLL (chronic lymphocytic leukemia) ([facility]) No date: Complication of anesthesia     Comment: woke up with severe cramps in extremities (26               years ago) No date: Hyperlipidemia  Reproductive/Obstetrics negative OB ROS                            Anesthesia Physical Anesthesia Plan  ASA: III  Anesthesia Plan: General   Post-op Pain Management:    Induction: Intravenous  Airway Management Planned: LMA  Additional Equipment:   Intra-op Plan:   Post-operative Plan: Extubation in OR  Informed Consent: I have reviewed the patients History and Physical, chart, labs and discussed the procedure including the risks, benefits and alternatives for the proposed anesthesia with the patient or authorized representative who has indicated his/her understanding and acceptance.   Dental advisory given  Plan Discussed with:  CRNA and Surgeon  Anesthesia Plan Comments:         Anesthesia Quick Evaluation

## 2016-06-02 NOTE — Op Note (Signed)
OPERATIVE NOTE  Date of surgery: 06/02/2016  Preoperative diagnosis:  1.  Postmenopausal bleeding 2. Thickened endometrium on ultrasound 3. CLL  Postoperative diagnosis: 1. Postmenopausal bleeding 2. Thickened endometrium on ultrasound 3. CLL  Operative procedure: 1. Hysteroscopy 2. Dilation and curettage of endometrium  Surgeon: Dr. Zipporah Plants Assistant: Alla German, PA-S Anesthesia: Gen.   Findings: Pelvis: Gynecoid Uterus: sounded to 10( cm Endometrial cavity with synechiae and possible polyps; cervix stenotic/scar; scarring is noted between the vagina and posterior ectocervix  Description of procedure: Patient was brought to the operating room where she was placed in supine position. General anesthesia was induced without difficulty using the LMA technique. The patient was placed in dorsal lithotomy position using bumblebee stirrups. A ChloraPrep perineal intravaginal prep and drape was performed in standard fashion. Weighted speculum was placed in the vagina. Single-tooth tenaculum was placed on the anterior cervix. Uterus was sounded to 10 cm. Hanks dilators were used up to a #20 [place] caliber to dilate the endocervical canal. The [facility] hysteroscope using lactated Ringer's as irrigant was used for the hysteroscopy. The above-noted findings were photo documented. The hysteroscope removed. The smooth and serrated curettes were then used to curettage the endometrial cavity with production of average tissue. Stone polyp forceps were used to grasp residual tissue left behind. Repeat hysteroscopy was performed and demonstrated excellent sampling. Procedure was then terminated with all instrumentation being removed from the vagina. The patient was then awakened mobilized and taken to the recovery room in satisfactory condition.  IV fluids: Per anesthesia flow sheet mL. Urine output: 50 mL. EBL: Minimal mL.  Instruments, needles, and sponge counts were verified as correct.  Brayton Mars, MD 06/02/2016

## 2016-06-02 NOTE — Discharge Instructions (Signed)

## 2016-06-02 NOTE — Transfer of Care (Signed)
Immediate Anesthesia Transfer of Care Note  Patient: Cathy [name]  Procedure(s) Performed: Procedure(s): DILATATION AND CURETTAGE /HYSTEROSCOPY (N/A)  Patient Location: PACU  Anesthesia Type:General  Level of Consciousness: awake, alert  and oriented  Airway & Oxygen Therapy: Patient Spontanous Breathing  Post-op Assessment: Report given to RN  Post vital signs: Reviewed and stable  Last Vitals:  Vitals:   06/02/16 0948 06/02/16 1214  BP: 123/63   Pulse: 70   Resp: 16   Temp: 36.5 C (P) 36.4 C    Last Pain:  Vitals:   06/02/16 0948  TempSrc: Oral         Complications: No apparent anesthesia complications

## 2016-06-02 NOTE — H&P (View-Only) (Signed)
Subjective:  PREOPERATIVE history HISTORY AND PHYSICAL     Date of surgery: 06/02/2016 Procedure: Stroboscopy/D&C   Patient is a 60 y.o. G3P3073female scheduled for hysteroscopy/D&C for evaluation of menorrhagia. Preoperative endometrial biopsy showed blood with rare endometrial fragments, insufficient for diagnosis. 05/06/2016 ultrasound demonstrated thickened endometrium measuring 8.4 mm with in homogenous texture. Simple right ovarian cyst was noted measuring 2.1 cm. 2 fibroids measuring 2.9 and 2.8 cm respectively were identified   Menstrual History: OB History    Gravida Para Term Preterm AB Living   3 3 3     3    SAB TAB Ectopic Multiple Live Births           59      Menarche age: NA Patient's last menstrual period was 05/18/2016 (approximate).    Past Medical History:  Diagnosis Date  . Chest pain   . CLL (chronic lymphocytic leukemia) ([place])   . Hyperlipidemia     Past Surgical History:  Procedure Laterality Date  . TUBAL LIGATION      OB History  Gravida Para Term Preterm AB Living  3 3 3     3   SAB TAB Ectopic Multiple Live Births          3    # Outcome Date GA Lbr Len/2nd Weight Sex Delivery Anes PTL Lv  3 Term 1990   7 lb (3.175 kg) F Vag-Spont   LIV  2 Term 1989   7 lb (3.175 kg) F Vag-Spont   LIV  1 Term 1987   8 lb 1.8 oz (3.679 kg) M Vag-Spont   LIV      Social History   Social History  . Marital status: Legally Separated    Spouse name: N/A  . Number of children: N/A  . Years of education: N/A   Social History Main Topics  . Smoking status: Never Smoker  . Smokeless tobacco: Never Used  . Alcohol use No  . Drug use: No  . Sexual activity: Not Currently    Birth control/ protection: Surgical   Other Topics Concern  . Not on file   Social History Narrative  . No narrative on file    Family History  Problem Relation Age of Onset  . Stroke Mother 47    CVA  . Diabetes Mother   . Deep vein thrombosis Brother 45    recurrent DVTs.   . Stroke Maternal Grandmother   . Colon cancer Maternal Grandmother   . Stroke Maternal Grandfather   . Diabetes Maternal Grandfather   . Diabetes Sister   . Breast cancer Neg Hx   . Ovarian cancer Neg Hx      (Not in a hospital admission)  No Known Allergies  Review of Systems Constitutional: No recent fever/chills/sweats Respiratory: No recent cough/bronchitis Cardiovascular: No chest pain Gastrointestinal: No recent nausea/vomiting/diarrhea Genitourinary: No UTI symptoms Hematologic/lymphatic:No history of coagulopathy or recent blood thinner use    Objective:    BP 114/67   Pulse 73   Ht 5' 5.5" (1.664 m)   Wt 138 lb 12.8 oz (63 kg)   LMP 05/18/2016 (Approximate) Comment: LITE BLEEDING  BMI 22.75 kg/m   General:   Normal  Skin:   normal  HEENT:  Normal  Neck:  Supple without Adenopathy or Thyromegaly  Lungs:   Heart:              Breasts:   Abdomen:  Pelvis:  M/S   Extremeties:  Neuro:    clear  to auscultation bilaterally   Normal without murmur   Not Examined   soft, non-tender; bowel sounds normal; no masses,  no organomegaly   Exam deferred to OR  No CVAT  Warm/Dry   Normal        Assessment:    1. Menorrhagia with regular cycle  2. Endometrial biopsy insufficient for diagnosis 3. Fibroid uterus 4. Thickened endometrium on ultrasound Plan:  Hysteroscopy/D&C  Preoperative counseling: Patient is to undergo hysteroscopy/D&C on 06/02/2016. She is understanding of the planned procedure and is aware of and is accepting of all surgical risks which include but are not limited to bleeding, infection, pelvic organ injury with need for repair, blood clot disorders, anesthesia risks, etc. All questions were answered. Informed consent is given. Patient is ready and willing to proceed with surgery as scheduled.  Brayton Mars, MD  Note: This dictation was prepared with Dragon dictation along with smaller phrase technology. Any transcriptional  errors that result from this process are unintentional.

## 2016-06-02 NOTE — Anesthesia Postprocedure Evaluation (Signed)
Anesthesia Post Note  Patient: Cathy [name]  Procedure(s) Performed: Procedure(s) (LRB): DILATATION AND CURETTAGE /HYSTEROSCOPY (N/A)  Patient location during evaluation: PACU Anesthesia Type: General Level of consciousness: awake and alert Pain management: pain level controlled Vital Signs Assessment: post-procedure vital signs reviewed and stable Respiratory status: spontaneous breathing, nonlabored ventilation, respiratory function stable and patient connected to nasal cannula oxygen Cardiovascular status: blood pressure returned to baseline and stable Postop Assessment: no signs of nausea or vomiting Anesthetic complications: no    Last Vitals:  Vitals:   06/02/16 1255 06/02/16 1316  BP: 123/62 (!) 110/54  Pulse: 72 71  Resp: 14 14  Temp: 36.2 C 36.5 C    Last Pain:  Vitals:   06/02/16 1255  TempSrc: Temporal  PainSc:                  Precious Haws Jamisen Duerson

## 2016-06-04 LAB — SURGICAL PATHOLOGY

## 2016-06-10 NOTE — Progress Notes (Signed)
[place]  Telephone:(336) [phone] Fax:(336) [phone]  ID: Cathy [name] OB: 23-Jul-1956  MR#: [id number]  [id number]  Patient Care Team: Wardell Honour, MD as PCP - General (Family Medicine)  CHIEF COMPLAINT:  CLL, Rai stage 1  INTERVAL HISTORY: Patient returns to clinic today for repeat laboratory work and further evaluation. She continues to tolerate Imbruvica without significant side effects. She continues to work full-time. She has no neurologic complaints.  She denies any weight loss.  She has no chest pain and denies any shortness of breath or cough.  She denies any nausea, vomiting, constipation, or diarrhea.  She has no melena or hematochezia.  She has no urinary complaints.  Patient offers no further specific complaints today.  REVIEW OF SYSTEMS:   Review of Systems  Constitutional: Positive for malaise/fatigue. Negative for diaphoresis, fever and weight loss.  Respiratory: Negative.  Negative for cough and shortness of breath.   Cardiovascular: Negative for chest pain and leg swelling.  Gastrointestinal: Negative.   Genitourinary: Negative.   Musculoskeletal: Negative.   Neurological: Negative for sensory change, weakness and headaches.  Psychiatric/Behavioral: Negative.     As per HPI. Otherwise, a complete review of systems is negative.  PAST MEDICAL HISTORY: Past Medical History:  Diagnosis Date  . Chest pain   . CLL (chronic lymphocytic leukemia) ([place])   . CLL (chronic lymphocytic leukemia) ([place])   . Complication of anesthesia    woke up with severe cramps in extremities (26 years ago)  . Hyperlipidemia     PAST SURGICAL HISTORY: Past Surgical History:  Procedure Laterality Date  . HYSTEROSCOPY W/D&C N/A 06/02/2016   Procedure: DILATATION AND CURETTAGE /HYSTEROSCOPY;  Surgeon: Brayton Mars, MD;  Location: [facility] ORS;  Service: Gynecology;  Laterality: N/A;  . TUBAL LIGATION      FAMILY HISTORY Family History  Problem  Relation Age of Onset  . Stroke Mother 73    CVA  . Diabetes Mother   . Deep vein thrombosis Brother 45    recurrent DVTs.  . Stroke Maternal Grandmother   . Colon cancer Maternal Grandmother   . Stroke Maternal Grandfather   . Diabetes Maternal Grandfather   . Diabetes Sister   . Breast cancer Neg Hx   . Ovarian cancer Neg Hx        ADVANCED DIRECTIVES:    HEALTH MAINTENANCE: Social History  Substance Use Topics  . Smoking status: Never Smoker  . Smokeless tobacco: Never Used  . Alcohol use No     No Known Allergies  Current Outpatient Prescriptions  Medication Sig Dispense Refill  . ibrutinib (IMBRUVICA) 140 MG capsul Take 3 capsules (420 mg total) by mouth daily. 90 capsule 5   No current facility-administered medications for this visit.     OBJECTIVE: Vitals:   06/11/16 1048  BP: 125/70  Pulse: 71  Resp: 18  Temp: 97.5 F (36.4 C)     Body mass index is 22.83 kg/m.    ECOG FS:0 - Asymptomatic  General: Well-developed, well-nourished, no acute distress. Eyes: anicteric sclera. HEENT: Cervical lymphadenopathy resolved Lungs: Clear to auscultation bilaterally. Heart: Regular rate and rhythm. No rubs, murmurs, or gallops. Abdomen: Soft, nontender, nondistended. No organomegaly noted, normoactive bowel sounds. Musculoskeletal: No edema, cyanosis, or clubbing. Neuro: Alert, answering all questions appropriately. Cranial nerves grossly intact. Skin: No rashes or petechiae noted. Psych: Normal affect.    LAB RESULTS:  Lab Results  Component Value Date   NA 135 06/11/2016   K 4.0  06/11/2016   CL 102 06/11/2016   CO2 23 06/11/2016   GLUCOSE 94 06/11/2016   BUN 16 06/11/2016   CREATININE 0.81 06/11/2016   CALCIUM 8.5 (L) 06/11/2016   PROT 6.8 06/11/2016   ALBUMIN 4.1 06/11/2016   AST 19 06/11/2016   ALT 17 06/11/2016   ALKPHOS 58 06/11/2016   BILITOT 0.3 06/11/2016   GFRNONAA >60 06/11/2016   GFRAA >60 06/11/2016    Lab Results  Component  Value Date   WBC 252.5 (HH) 06/11/2016   NEUTROABS 5.1 06/11/2016   HGB 9.9 (L) 06/11/2016   HCT 31.1 (L) 06/11/2016   MCV 83.8 06/11/2016   PLT 282 06/11/2016     STUDIES: No results found.  ASSESSMENT: CLL, Rai stage 1  PLAN:    1.  CLL: Peripheral blood flow cytometry confirmed the results. Patient completed her second round of 4 cycles of weekly Rituxan on Jan 16, 2016. Patient's white count Remains significantly elevated at 252, but has seemed to plateaued. We will continue Imbruvica 420 mg daily for the time being. Will proceed with treatment until progression of disease or intolerable side effects. Return to clinic in 4 and 8 weeks for laboratory work only and then in 3 months for further evaluation. If no improvement, patient may require IV chemotherapy using Treanda and Rituxan. Continue 300 mg allopurinol daily.  2. Pedal edema: Mild, monitor.   Approximately 30 minutes was spent in discussion of which greater than 50% was consultation.  Patient expressed understanding and was in agreement with this plan. She also understands that She can call clinic at any time with any questions, concerns, or complaints.    Lloyd Huger, MD   06/15/2016 3:57 PM

## 2016-06-11 ENCOUNTER — Inpatient Hospital Stay (HOSPITAL_BASED_OUTPATIENT_CLINIC_OR_DEPARTMENT_OTHER): Payer: Managed Care, Other (non HMO) | Admitting: Oncology

## 2016-06-11 ENCOUNTER — Ambulatory Visit (INDEPENDENT_AMBULATORY_CARE_PROVIDER_SITE_OTHER): Payer: Managed Care, Other (non HMO) | Admitting: Obstetrics and Gynecology

## 2016-06-11 ENCOUNTER — Inpatient Hospital Stay: Payer: Managed Care, Other (non HMO) | Attending: Oncology

## 2016-06-11 VITALS — BP 116/67 | HR 73 | Ht 65.5 in | Wt 139.1 lb

## 2016-06-11 VITALS — BP 125/70 | HR 71 | Temp 97.5°F | Resp 18 | Wt 139.3 lb

## 2016-06-11 DIAGNOSIS — C9110 Chronic lymphocytic leukemia of B-cell type not having achieved remission: Secondary | ICD-10-CM | POA: Diagnosis not present

## 2016-06-11 DIAGNOSIS — N840 Polyp of corpus uteri: Secondary | ICD-10-CM

## 2016-06-11 DIAGNOSIS — Z23 Encounter for immunization: Secondary | ICD-10-CM | POA: Diagnosis not present

## 2016-06-11 DIAGNOSIS — N921 Excessive and frequent menstruation with irregular cycle: Secondary | ICD-10-CM

## 2016-06-11 DIAGNOSIS — Z09 Encounter for follow-up examination after completed treatment for conditions other than malignant neoplasm: Secondary | ICD-10-CM

## 2016-06-11 DIAGNOSIS — R5383 Other fatigue: Secondary | ICD-10-CM

## 2016-06-11 DIAGNOSIS — Z79899 Other long term (current) drug therapy: Secondary | ICD-10-CM | POA: Diagnosis not present

## 2016-06-11 DIAGNOSIS — Z800 Family history of malignant neoplasm of digestive organs: Secondary | ICD-10-CM

## 2016-06-11 DIAGNOSIS — R531 Weakness: Secondary | ICD-10-CM

## 2016-06-11 DIAGNOSIS — R079 Chest pain, unspecified: Secondary | ICD-10-CM | POA: Diagnosis not present

## 2016-06-11 DIAGNOSIS — R609 Edema, unspecified: Secondary | ICD-10-CM

## 2016-06-11 DIAGNOSIS — E785 Hyperlipidemia, unspecified: Secondary | ICD-10-CM

## 2016-06-11 DIAGNOSIS — Z8041 Family history of malignant neoplasm of ovary: Secondary | ICD-10-CM | POA: Insufficient documentation

## 2016-06-11 DIAGNOSIS — Z9889 Other specified postprocedural states: Secondary | ICD-10-CM | POA: Insufficient documentation

## 2016-06-11 DIAGNOSIS — Z0289 Encounter for other administrative examinations: Secondary | ICD-10-CM

## 2016-06-11 LAB — COMPREHENSIVE METABOLIC PANEL
ALK PHOS: 58 U/L (ref 38–126)
ALT: 17 U/L (ref 14–54)
AST: 19 U/L (ref 15–41)
Albumin: 4.1 g/dL (ref 3.5–5.0)
Anion gap: 10 (ref 5–15)
BILIRUBIN TOTAL: 0.3 mg/dL (ref 0.3–1.2)
BUN: 16 mg/dL (ref 6–20)
CALCIUM: 8.5 mg/dL — AB (ref 8.9–10.3)
CO2: 23 mmol/L (ref 22–32)
CREATININE: 0.81 mg/dL (ref 0.44–1.00)
Chloride: 102 mmol/L (ref 101–111)
Glucose, Bld: 94 mg/dL (ref 65–99)
Potassium: 4 mmol/L (ref 3.5–5.1)
Sodium: 135 mmol/L (ref 135–145)
TOTAL PROTEIN: 6.8 g/dL (ref 6.5–8.1)

## 2016-06-11 LAB — CBC WITH DIFFERENTIAL/PLATELET
BASOS ABS: 2.5 10*3/uL — AB (ref 0–0.1)
BASOS PCT: 1 %
EOS PCT: 0 %
Eosinophils Absolute: 0 10*3/uL (ref 0–0.7)
HEMATOCRIT: 31.1 % — AB (ref 35.0–47.0)
Hemoglobin: 9.9 g/dL — ABNORMAL LOW (ref 12.0–16.0)
LYMPHS ABS: 242.4 10*3/uL — AB (ref 1.0–3.6)
Lymphocytes Relative: 96 %
MCH: 26.5 pg (ref 26.0–34.0)
MCHC: 31.7 g/dL — ABNORMAL LOW (ref 32.0–36.0)
MCV: 83.8 fL (ref 80.0–100.0)
MONOS PCT: 1 %
Monocytes Absolute: 2.5 10*3/uL — ABNORMAL HIGH (ref 0.2–0.9)
NEUTROS ABS: 5.1 10*3/uL (ref 1.4–6.5)
NEUTROS PCT: 2 %
Platelets: 282 10*3/uL (ref 150–440)
RBC: 3.71 MIL/uL — AB (ref 3.80–5.20)
RDW: 14.6 % — ABNORMAL HIGH (ref 11.5–14.5)
WBC: 252.5 10*3/uL — AB (ref 4.0–10.5)
nRBC: 1 /100 WBC — ABNORMAL HIGH

## 2016-06-11 LAB — MAGNESIUM: MAGNESIUM: 2 mg/dL (ref 1.7–2.4)

## 2016-06-11 LAB — PHOSPHORUS: PHOSPHORUS: 3.7 mg/dL (ref 2.5–4.6)

## 2016-06-11 MED ORDER — INFLUENZA VAC SPLIT QUAD 0.5 ML IM SUSY
0.5000 mL | PREFILLED_SYRINGE | Freq: Once | INTRAMUSCULAR | Status: AC
  Administered 2016-06-11: 0.5 mL via INTRAMUSCULAR

## 2016-06-11 NOTE — Progress Notes (Signed)
States is feeling well today. Pt understands to take 3 tabs (420mg ) imbruvica daily. Pt verbalizes has been taking imbruvica as prescribed and has only missed one day of therapy since treatment started due to procedure. Complains of having mild symptoms of nausea and muscle cramps while taking imbruvica.

## 2016-06-11 NOTE — Progress Notes (Signed)
Chief complaint: 1. One week postop check 2. Status post D&C  Patient presents for follow-up 1 week after surgery. She had postmenopausal bleeding and ultrasound which demonstrated thickened endometrium. Intraoperative findings demonstrated some cervical stenosis and endometrial polyps.  Pathology: DIAGNOSIS:  A. ENDOMETRIAL CURETTINGS:  - ENDOMETRIAL POLYP.  - PROLIFERATIVE ENDOMETRIUM WITH TUBAL METAPLASIA.  - UNREMARKABLE STRIPS OF ENDOCERVICAL GLANDULAR AND SQUAMOUS EPITHELIUM.  - NEGATIVE FOR ATYPIA AND MALIGNANCY.   Patient has done well since surgery without pelvic pain or bleeding  Patient has never had a mammogram. She has not had recent Pap smear.   OBJECTIVE: BP 116/67   Pulse 73   Ht 5' 5.5" (1.664 m)   Wt 139 lb 1.6 oz (63.1 kg)   LMP 04/18/2016 (LMP Unknown)   BMI 22.80 kg/m  Physical exam-deferred  ASSESSMENT: 1. Normal postop check 1 week status post hysteroscopy/D&C 2. Postmenopausal bleeding and thickened endometrium secondary to endometrial polyps 3. Needs physical exam and screening mammogram  PLAN: 1. Menstrual calendar monitoring for bleeding 2. Return in 3 months for annual exam and scheduling of mammogram 3. Resume activities as tolerated.  Brayton Mars, MD  Note: This dictation was prepared with Dragon dictation along with smaller phrase technology. Any transcriptional errors that result from this process are unintentional.

## 2016-06-11 NOTE — Patient Instructions (Signed)
1. Return in 3 months for annual exam 2. Resume activities as tolerated

## 2016-07-15 ENCOUNTER — Inpatient Hospital Stay: Payer: Managed Care, Other (non HMO) | Attending: Oncology

## 2016-07-15 DIAGNOSIS — C9110 Chronic lymphocytic leukemia of B-cell type not having achieved remission: Secondary | ICD-10-CM | POA: Insufficient documentation

## 2016-07-15 LAB — COMPREHENSIVE METABOLIC PANEL
ALBUMIN: 4 g/dL (ref 3.5–5.0)
ALK PHOS: 45 U/L (ref 38–126)
ALT: 12 U/L — AB (ref 14–54)
AST: 18 U/L (ref 15–41)
Anion gap: 5 (ref 5–15)
BUN: 17 mg/dL (ref 6–20)
CALCIUM: 8.4 mg/dL — AB (ref 8.9–10.3)
CHLORIDE: 105 mmol/L (ref 101–111)
CO2: 26 mmol/L (ref 22–32)
CREATININE: 0.82 mg/dL (ref 0.44–1.00)
GFR calc Af Amer: >60 mL/min (ref >60)
GFR calc non Af Amer: >60 mL/min (ref >60)
GLUCOSE: 94 mg/dL (ref 65–99)
Potassium: 4 mmol/L (ref 3.5–5.1)
SODIUM: 136 mmol/L (ref 135–145)
Total Bilirubin: 0.5 mg/dL (ref 0.3–1.2)
Total Protein: 6.5 g/dL (ref 6.5–8.1)

## 2016-07-15 LAB — CBC WITH DIFFERENTIAL/PLATELET
BASOS ABS: 0 10*3/uL (ref 0–0.1)
Basophils Relative: 0 %
EOS ABS: 0 10*3/uL (ref 0–0.7)
EOS PCT: 0 %
HCT: 33.2 % — ABNORMAL LOW (ref 35.0–47.0)
HEMOGLOBIN: 10.2 g/dL — AB (ref 12.0–16.0)
Lymphocytes Relative: 96 %
Lymphs Abs: 250.1 10*3/uL — ABNORMAL HIGH (ref 1.0–3.6)
MCH: 25.3 pg — ABNORMAL LOW (ref 26.0–34.0)
MCHC: 30.8 g/dL — ABNORMAL LOW (ref 32.0–36.0)
MCV: 82.2 fL (ref 80.0–100.0)
MONO ABS: 2.6 10*3/uL — AB (ref 0.2–0.9)
Monocytes Relative: 1 %
NEUTROS PCT: 3 %
Neutro Abs: 7.8 10*3/uL — ABNORMAL HIGH (ref 1.4–6.5)
PLATELETS: 253 10*3/uL (ref 150–440)
RBC: 4.04 MIL/uL (ref 3.80–5.20)
RDW: 16 % — ABNORMAL HIGH (ref 11.5–14.5)
WBC: 260.5 10*3/uL — AB (ref 3.6–11.0)

## 2016-07-15 LAB — PHOSPHORUS: Phosphorus: 3.9 mg/dL (ref 2.5–4.6)

## 2016-07-15 LAB — MAGNESIUM: Magnesium: 2.1 mg/dL (ref 1.7–2.4)

## 2016-07-23 ENCOUNTER — Telehealth: Payer: Self-pay | Admitting: *Deleted

## 2016-07-23 NOTE — Telephone Encounter (Signed)
Asking if Dr Grayland Ormond will order Wellbutrin for her. Per Dr Grayland Ormond, no, PCP should do that. Left message on her VM

## 2016-07-30 ENCOUNTER — Telehealth: Payer: Self-pay | Admitting: *Deleted

## 2016-07-30 NOTE — Telephone Encounter (Signed)
Wanted [place] to have her contact information if we need her. Also requests that her call be returned to discuss plan and treatment

## 2016-08-13 ENCOUNTER — Inpatient Hospital Stay: Payer: Managed Care, Other (non HMO)

## 2016-08-17 ENCOUNTER — Encounter: Payer: Self-pay | Admitting: Gynecology

## 2016-08-17 ENCOUNTER — Ambulatory Visit
Admission: EM | Admit: 2016-08-17 | Discharge: 2016-08-17 | Disposition: A | Discharge status: Home / Self Care | Payer: Managed Care, Other (non HMO) | Attending: Family Medicine | Admitting: Family Medicine

## 2016-08-17 DIAGNOSIS — H6123 Impacted cerumen, bilateral: Secondary | ICD-10-CM

## 2016-08-17 DIAGNOSIS — B0189 Other varicella complications: Secondary | ICD-10-CM | POA: Diagnosis not present

## 2016-08-17 MED ORDER — ACYCLOVIR 400 MG PO TABS: 800.0000 mg | ORAL_TABLET | Freq: Four times a day (QID) | ORAL | 0 refills | Status: AC

## 2016-08-17 NOTE — ED Triage Notes (Signed)
Per patient c/o had fever a few days ago and with sinus infection. Per patient notice rash x 3 days ago at back.

## 2016-08-17 NOTE — ED Provider Notes (Signed)
CSN: [id number]     Arrival date & time 08/17/16  0827 History   First MD Initiated Contact with Patient 08/17/16 [phone]     Chief Complaint  Patient presents with  . Sinusitis  . Rash   (Consider location/radiation/quality/duration/timing/severity/associated sxs/prior Treatment) Patient is a 60 year old female, presents today for 7 day duration of flulike symptoms including fever, chills, fatigue, body aches and headache. Patient reports that the fever and chills started on day 1 and lasted for 3 days. Patient never took her temperature at home but was adamant that she had a fever as she felt "very warm", was shivering, and had severe chills. She denies sinus pain/pressure, sneezing, sore throat or nasal congestion. She also denies abdominal pain, N/V/D. Patient reports a new onset of a rash on her back 3 days ago. Rash is itchy and burns. Patient cannot recall if she ever had chickenpox vaccine.      Past Medical History:  Diagnosis Date  . Chest pain   . CLL (chronic lymphocytic leukemia) ([place])   . CLL (chronic lymphocytic leukemia) ([place])   . Complication of anesthesia    woke up with severe cramps in extremities (26 years ago)  . Hyperlipidemia    Past Surgical History:  Procedure Laterality Date  . HYSTEROSCOPY W/D&C N/A 06/02/2016   Procedure: DILATATION AND CURETTAGE /HYSTEROSCOPY;  Surgeon: Brayton Mars, MD;  Location: [facility] ORS;  Service: Gynecology;  Laterality: N/A;  . TUBAL LIGATION     Family History  Problem Relation Age of Onset  . Stroke Mother 28    CVA  . Diabetes Mother   . Deep vein thrombosis Brother 45    recurrent DVTs.  . Stroke Maternal Grandmother   . Colon cancer Maternal Grandmother   . Stroke Maternal Grandfather   . Diabetes Maternal Grandfather   . Diabetes Sister   . Breast cancer Neg Hx   . Ovarian cancer Neg Hx    Social History  Substance Use Topics  . Smoking status: Never Smoker  . Smokeless tobacco: Never Used  . Alcohol use  No   OB History    Gravida Para Term Preterm AB Living   3 3 3     3    SAB TAB Ectopic Multiple Live Births           3     Review of Systems  Constitutional: Positive for appetite change, chills, fatigue and fever.  HENT: Positive for ear pain, rhinorrhea, sinus pain and sinus pressure. Negative for congestion, sneezing and sore throat.   Respiratory: Positive for cough. Negative for shortness of breath and wheezing.   Cardiovascular: Negative for chest pain and palpitations.  Gastrointestinal: Negative for abdominal pain, nausea and vomiting.  Skin: Positive for rash.  Neurological: Positive for headaches. Negative for dizziness.    Allergies  Patient has no known allergies.  Home Medications   Prior to Admission medications   Medication Sig Start Date End Date Taking? Authorizing Provider  acyclovir (ZOVIRAX) 400 MG tablet Take 2 tablets (800 mg total) by mouth 4 (four) times daily. 08/17/16 08/24/16  Barry Dienes, NP  ibrutinib (IMBRUVICA) 140 MG capsul Take 3 capsules (420 mg total) by mouth daily. 02/27/16   Lloyd Huger, MD   Meds Ordered and Administered this Visit  Medications - No data to display  BP 133/67 (BP Location: Left Arm)   Pulse 80   Temp 98.5 F (36.9 C) (Oral)   Resp 16   Ht 5\' 5"  (1.651  m)   Wt 145 lb (65.8 kg)   LMP 08/07/2016   BMI 24.13 kg/m  No data found.   Physical Exam  Constitutional: She is oriented to person, place, and time. She appears well-developed and well-nourished.  HENT:  Head: Normocephalic and atraumatic.  Right Ear: External ear normal.  Left Ear: External ear normal.  Nose: Nose normal.  Mouth/Throat: Oropharynx is clear and moist. No oropharyngeal exudate.  Cerumen impaction noted bilaterally  Eyes: Conjunctivae and EOM are normal. Pupils are equal, round, and reactive to light.  Neck: Normal range of motion. Neck supple.  Cardiovascular: Normal rate, regular rhythm and normal heart sounds.   Pulmonary/Chest:  Effort normal and breath sounds normal. No respiratory distress. She has no wheezes.  Abdominal: Soft. Bowel sounds are normal. She exhibits no distension and no mass. There is no tenderness.  Lymphadenopathy:    She has no cervical adenopathy.  Neurological: She is alert and oriented to person, place, and time.  Skin: Skin is warm and dry.  Has scattered distinct vesicular rash noted in extremities, abdomen and back; most prominent in the back.   Some vesicles have fully crusted.  See picture below.   Psychiatric: She has a normal mood and affect.  Nursing note and vitals reviewed.       Urgent Care Course   Clinical Course     Procedures (including critical care time)  Labs Review Labs Reviewed - No data to display  Imaging Review No results found.   MDM   1. Varicella with complication   2. Bilateral impacted cerumen    1) Clinical presentation most consistent with Chickenpox without complications. Will treated with Acyclovir 800 mg PO four times a day. Remain off work until all the vesicles has crusted over and no new rash. Work note given today.  2) Ear irrigation performed today and was successful. She tolerated the procedure well.   Barry Dienes, NP 08/17/16 1150

## 2016-08-19 ENCOUNTER — Telehealth: Payer: Self-pay | Admitting: *Deleted

## 2016-08-19 NOTE — Telephone Encounter (Signed)
Per Dr Grayland Ormond, hold imbruvica until after the blisters crust over good. Reschedule appt after blisters have crusted over well. Message left on patient VM with instructions to return my call for any questions

## 2016-08-19 NOTE — Telephone Encounter (Signed)
Has been sick with Chicken Pox for a week and still has oozing blisters. Asking if she is OK to restart he Imbruvica and if she should come in or reschedule her appt for 1/4. Please advise

## 2016-08-20 ENCOUNTER — Ambulatory Visit
Admission: EM | Admit: 2016-08-20 | Discharge: 2016-08-20 | Disposition: A | Discharge status: Home / Self Care | Payer: Managed Care, Other (non HMO) | Attending: Family Medicine | Admitting: Family Medicine

## 2016-08-20 DIAGNOSIS — G4489 Other headache syndrome: Secondary | ICD-10-CM

## 2016-08-20 DIAGNOSIS — B019 Varicella without complication: Secondary | ICD-10-CM | POA: Diagnosis not present

## 2016-08-20 MED ORDER — HYDROCODONE-ACETAMINOPHEN 5-325 MG PO TABS: 1.0000 | ORAL_TABLET | Freq: Four times a day (QID) | ORAL | 0 refills | Status: DC | PRN

## 2016-08-20 NOTE — ED Triage Notes (Addendum)
Pt here on 12/31 and diagnosed with varicella. Since that time she has had pounding headache whenever she coughs or wherever her head is touching. Pt concerned over her CLL medication and wondering if these are side effects from this med. Pain 8/10 at worst

## 2016-08-20 NOTE — Discharge Instructions (Signed)
Follow up with oncologist tomorrow

## 2016-08-21 ENCOUNTER — Other Ambulatory Visit: Payer: Managed Care, Other (non HMO)

## 2016-09-17 ENCOUNTER — Other Ambulatory Visit: Payer: Managed Care, Other (non HMO)

## 2016-09-17 ENCOUNTER — Ambulatory Visit: Payer: Managed Care, Other (non HMO) | Admitting: Oncology

## 2016-09-17 ENCOUNTER — Encounter: Payer: Managed Care, Other (non HMO) | Admitting: Obstetrics and Gynecology

## 2016-10-01 NOTE — Progress Notes (Deleted)
[place]  Telephone:(336) [phone] Fax:(336) [phone]  ID: Riesa Pope OB: [birth year]  MR#: [id number]  [id number]  Patient Care Team: Wardell Honour, MD as PCP - General (Family Medicine)  CHIEF COMPLAINT:  CLL, Rai stage 1  INTERVAL HISTORY: Patient returns to clinic today for repeat laboratory work and further evaluation. She continues to tolerate Imbruvica without significant side effects. She continues to work full-time. She has no neurologic complaints.  She denies any weight loss.  She has no chest pain and denies any shortness of breath or cough.  She denies any nausea, vomiting, constipation, or diarrhea.  She has no melena or hematochezia.  She has no urinary complaints.  Patient offers no further specific complaints today.  REVIEW OF SYSTEMS:   Review of Systems  Constitutional: Positive for malaise/fatigue. Negative for diaphoresis, fever and weight loss.  Respiratory: Negative.  Negative for cough and shortness of breath.   Cardiovascular: Negative for chest pain and leg swelling.  Gastrointestinal: Negative.   Genitourinary: Negative.   Musculoskeletal: Negative.   Neurological: Negative for sensory change, weakness and headaches.  Psychiatric/Behavioral: Negative.     As per HPI. Otherwise, a complete review of systems is negative.  PAST MEDICAL HISTORY: Past Medical History:  Diagnosis Date  . Chest pain   . CLL (chronic lymphocytic leukemia) ([place])   . CLL (chronic lymphocytic leukemia) ([place])   . CLL (chronic lymphocytic leukemia) ([place])   . Complication of anesthesia    woke up with severe cramps in extremities (26 years ago)  . Hyperlipidemia     PAST SURGICAL HISTORY: Past Surgical History:  Procedure Laterality Date  . HYSTEROSCOPY W/D&C N/A 06/02/2016   Procedure: DILATATION AND CURETTAGE /HYSTEROSCOPY;  Surgeon: Brayton Mars, MD;  Location: [facility] ORS;  Service: Gynecology;  Laterality: N/A;  . TUBAL LIGATION       FAMILY HISTORY Family History  Problem Relation Age of Onset  . Stroke Mother 26    CVA  . Diabetes Mother   . Deep vein thrombosis Brother 45    recurrent DVTs.  . Stroke Maternal Grandmother   . Colon cancer Maternal Grandmother   . Stroke Maternal Grandfather   . Diabetes Maternal Grandfather   . Diabetes Sister   . Breast cancer Neg Hx   . Ovarian cancer Neg Hx        ADVANCED DIRECTIVES:    HEALTH MAINTENANCE: Social History  Substance Use Topics  . Smoking status: Never Smoker  . Smokeless tobacco: Never Used  . Alcohol use No     No Known Allergies  Current Outpatient Prescriptions  Medication Sig Dispense Refill  . HYDROcodone-acetaminophen (NORCO/VICODIN) 5-325 MG tablet Take 1-2 tablets by mouth every 6 (six) hours as needed. 6 tablet 0  . ibrutinib (IMBRUVICA) 140 MG capsul Take 3 capsules (420 mg total) by mouth daily. 90 capsule 5   No current facility-administered medications for this visit.     OBJECTIVE: There were no vitals filed for this visit.   There is no height or weight on file to calculate BMI.    ECOG FS:0 - Asymptomatic  General: Well-developed, well-nourished, no acute distress. Eyes: anicteric sclera. HEENT: Cervical lymphadenopathy resolved Lungs: Clear to auscultation bilaterally. Heart: Regular rate and rhythm. No rubs, murmurs, or gallops. Abdomen: Soft, nontender, nondistended. No organomegaly noted, normoactive bowel sounds. Musculoskeletal: No edema, cyanosis, or clubbing. Neuro: Alert, answering all questions appropriately. Cranial nerves grossly intact. Skin: No rashes or petechiae noted. Psych: Normal affect.  LAB RESULTS:  Lab Results  Component Value Date   NA 136 07/15/2016   K 4.0 07/15/2016   CL 105 07/15/2016   CO2 26 07/15/2016   GLUCOSE 94 07/15/2016   BUN 17 07/15/2016   CREATININE 0.82 07/15/2016   CALCIUM 8.4 (L) 07/15/2016   PROT 6.5 07/15/2016   ALBUMIN 4.0 07/15/2016   AST 18 07/15/2016    ALT 12 (L) 07/15/2016   ALKPHOS 45 07/15/2016   BILITOT 0.5 07/15/2016   GFRNONAA >60 07/15/2016   GFRAA >60 07/15/2016    Lab Results  Component Value Date   WBC 260.5 (HH) 07/15/2016   NEUTROABS 7.8 (H) 07/15/2016   HGB 10.2 (L) 07/15/2016   HCT 33.2 (L) 07/15/2016   MCV 82.2 07/15/2016   PLT 253 07/15/2016     STUDIES: No results found.  ASSESSMENT: CLL, Rai stage 1  PLAN:    1.  CLL: Peripheral blood flow cytometry confirmed the results. Patient completed her second round of 4 cycles of weekly Rituxan on Jan 16, 2016. Patient's white count Remains significantly elevated at 252, but has seemed to plateaued. We will continue Imbruvica 420 mg daily for the time being. Will proceed with treatment until progression of disease or intolerable side effects. Return to clinic in 4 and 8 weeks for laboratory work only and then in 3 months for further evaluation. If no improvement, patient may require IV chemotherapy using Treanda and Rituxan. Continue 300 mg allopurinol daily.  2. Pedal edema: Mild, monitor.   Approximately 30 minutes was spent in discussion of which greater than 50% was consultation.  Patient expressed understanding and was in agreement with this plan. She also understands that She can call clinic at any time with any questions, concerns, or complaints.    Lloyd Huger, MD   10/01/2016 9:51 PM

## 2016-10-02 ENCOUNTER — Inpatient Hospital Stay: Payer: Managed Care, Other (non HMO) | Admitting: Oncology

## 2016-10-02 ENCOUNTER — Inpatient Hospital Stay: Payer: Managed Care, Other (non HMO)

## 2016-10-02 ENCOUNTER — Ambulatory Visit
Admission: EM | Admit: 2016-10-02 | Discharge: 2016-10-02 | Disposition: A | Discharge status: Home / Self Care | Payer: Managed Care, Other (non HMO) | Attending: Emergency Medicine | Admitting: Emergency Medicine

## 2016-10-02 ENCOUNTER — Encounter: Payer: Managed Care, Other (non HMO) | Admitting: Obstetrics and Gynecology

## 2016-10-02 ENCOUNTER — Encounter: Payer: Self-pay | Admitting: *Deleted

## 2016-10-02 DIAGNOSIS — R69 Illness, unspecified: Secondary | ICD-10-CM | POA: Diagnosis not present

## 2016-10-02 DIAGNOSIS — R6889 Other general symptoms and signs: Secondary | ICD-10-CM | POA: Diagnosis not present

## 2016-10-02 DIAGNOSIS — J111 Influenza due to unidentified influenza virus with other respiratory manifestations: Secondary | ICD-10-CM

## 2016-10-02 MED ORDER — HYDROCOD POLST-CPM POLST ER 10-8 MG/5ML PO SUER: 5.0000 mL | Freq: Two times a day (BID) | ORAL | 0 refills | Status: DC

## 2016-10-02 MED ORDER — OSELTAMIVIR PHOSPHATE 75 MG PO CAPS: 75.0000 mg | ORAL_CAPSULE | Freq: Two times a day (BID) | ORAL | 0 refills | Status: DC

## 2016-10-02 NOTE — ED Provider Notes (Signed)
CSN: [id number]     Arrival date & time 10/02/16  [id number] History   First MD Initiated Contact with Patient 10/02/16 1043     Chief Complaint  Patient presents with  . Fever  . Sore Throat  . Cough  . Generalized Body Aches   (Consider location/radiation/quality/duration/timing/severity/associated sxs/prior Treatment) HPI 61 year old Cathy [name] who has CLL recently completed her chemotherapy in January 2018. He presents with symptoms of cough and body aches fever and congestion that started 2 days ago. She had An appointment today with her oncologist but because of her illness and fever of 101 was told  to reschedule her appointment . He did receive her flu shot earlier this year.       Past Medical History:  Diagnosis Date  . Chest pain   . CLL (chronic lymphocytic leukemia) ([place])   . CLL (chronic lymphocytic leukemia) ([place])   . CLL (chronic lymphocytic leukemia) ([place])   . Complication of anesthesia    woke up with severe cramps in extremities (26 years ago)  . Hyperlipidemia    Past Surgical History:  Procedure Laterality Date  . HYSTEROSCOPY W/D&C N/A 06/02/2016   Procedure: DILATATION AND CURETTAGE /HYSTEROSCOPY;  Surgeon: Brayton Mars, MD;  Location: [facility] ORS;  Service: Gynecology;  Laterality: N/A;  . TUBAL LIGATION     Family History  Problem Relation Age of Onset  . Stroke Mother 81    CVA  . Diabetes Mother   . Deep vein thrombosis Brother 45    recurrent DVTs.  . Stroke Maternal Grandmother   . Colon cancer Maternal Grandmother   . Stroke Maternal Grandfather   . Diabetes Maternal Grandfather   . Diabetes Sister   . Breast cancer Neg Hx   . Ovarian cancer Neg Hx    Social History  Substance Use Topics  . Smoking status: Never Smoker  . Smokeless tobacco: Never Used  . Alcohol use No   OB History    Gravida Para Term Preterm AB Living   3 3 3     3    SAB TAB Ectopic Multiple Live Births           3     Review of Systems  Constitutional:  Positive for activity change, appetite change, chills, fatigue and fever.  HENT: Positive for congestion.   Respiratory: Positive for cough. Negative for shortness of breath, wheezing and stridor.   All other systems reviewed and are negative.   Allergies  Patient has no known allergies.  Home Medications   Prior to Admission medications   Medication Sig Start Date End Date Taking? Authorizing Provider  chlorpheniramine-HYDROcodone (TUSSIONEX PENNKINETIC ER) 10-8 MG/5ML SUER Take 5 mLs by mouth 2 (two) times daily. 10/02/16   Lorin Picket, PA-C  HYDROcodone-acetaminophen (NORCO/VICODIN) 5-325 MG tablet Take 1-2 tablets by mouth every 6 (six) hours as needed. 08/20/16   Norval Gable, MD  ibrutinib (IMBRUVICA) 140 MG capsul Take 3 capsules (420 mg total) by mouth daily. 7/12/Cathy   Lloyd Huger, MD  oseltamivir (TAMIFLU) 75 MG capsule Take 1 capsule (75 mg total) by mouth every 12 (twelve) hours. 10/02/16   Lorin Picket, PA-C   Meds Ordered and Administered this Visit  Medications - No data to display  BP 131/61 (BP Location: Right Arm)   Pulse (!) 101   Temp (!) 101.9 F (38.8 C) (Oral)   Resp 20   LMP 08/01/2016   SpO2 99%  No data found.   Physical Exam  Constitutional: She is oriented to person, place, and time. She appears well-developed and well-nourished. No distress.  HENT:  Head: Normocephalic and atraumatic.  Right Ear: External ear normal.  Left Ear: External ear normal.  Nose: Nose normal.  Mouth/Throat: Oropharynx is clear and moist. No oropharyngeal exudate.  Eyes: EOM are normal. Pupils are equal, round, and reactive to light. Right eye exhibits no discharge. Left eye exhibits no discharge.  Neck: Normal range of motion. Neck supple.  Pulmonary/Chest: Effort normal and breath sounds normal. No respiratory distress. She has no wheezes. She has no rales.  Musculoskeletal: Normal range of motion.  Lymphadenopathy:    She has no cervical adenopathy.   Neurological: She is alert and oriented to person, place, and time.  Skin: Skin is warm and dry. She is not diaphoretic.  Psychiatric: She has a normal mood and affect. Her behavior is normal. Judgment and thought content normal.  Nursing note and vitals reviewed.   Urgent Care Course     Procedures (including critical care time)  Labs Review Labs Reviewed - No data to display  Imaging Review No results found.   Visual Acuity Review  Right Eye Distance:   Left Eye Distance:   Bilateral Distance:    Right Eye Near:   Left Eye Near:    Bilateral Near:         MDM   1. Flu-like symptoms   2. Influenza-like illness    Discharge Medication List as of 10/02/2016 10:56 AM    START taking these medications   Details  chlorpheniramine-HYDROcodone (TUSSIONEX PENNKINETIC ER) 10-8 MG/5ML SUER Take 5 mLs by mouth 2 (two) times daily., Starting Thu 10/02/2016, Print    oseltamivir (TAMIFLU) 75 MG capsule Take 1 capsule (75 mg total) by mouth every 12 (twelve) hours., Starting Thu 10/02/2016, Normal      Plan: 1. Test/x-ray results and diagnosis reviewed with patient 2. rx as per orders; risks, benefits, potential side effects reviewed with patient 3. Recommend supportive treatment with Rest and fluids. Start on Tamiflu but recommended that the patient follow-up with her oncologist as soon as possible. Symptoms could also be partially from the CLL. I will also provide her with Tussionex for the cough that she has been having. If she worsens she should be seen in emergency department. 4. F/u prn if symptoms worsen or don't improve     Lorin Picket, PA-C 10/02/16 [address] [place], [place] 10/02/16 1109

## 2016-10-02 NOTE — ED Triage Notes (Signed)
Patient started having symptoms of cough, body aches, fever, and congestion 2 days ago.

## 2016-10-13 NOTE — Progress Notes (Deleted)
ANNUAL PREVENTATIVE CARE GYN  ENCOUNTER NOTE  Subjective:       Cathy [name] is a 61 y.o. G30P3003 female here for a routine annual gynecologic exam.  Current complaints: 1.      Gynecologic History No LMP recorded. Contraception: tubal ligation Last Pap: ?Marland Kitchen Results were: Marland Kitchen Last mammogram: ?. Results were: Marland Kitchen  Obstetric History OB History  Gravida Para Term Preterm AB Living  3 3 3     3   SAB TAB Ectopic Multiple Live Births          3    # Outcome Date GA Lbr Len/2nd Weight Sex Delivery Anes PTL Lv  3 Term 1990   7 lb (3.175 kg) F Vag-Spont   LIV  2 Term 1989   7 lb (3.175 kg) F Vag-Spont   LIV  1 Term 1987   8 lb 1.8 oz (3.679 kg) M Vag-Spont   LIV      Past Medical History:  Diagnosis Date  . Chest pain   . CLL (chronic lymphocytic leukemia) ([place])   . CLL (chronic lymphocytic leukemia) ([place])   . CLL (chronic lymphocytic leukemia) ([place])   . Complication of anesthesia    woke up with severe cramps in extremities (26 years ago)  . Hyperlipidemia     Past Surgical History:  Procedure Laterality Date  . HYSTEROSCOPY W/D&C N/A 06/02/2016   Procedure: DILATATION AND CURETTAGE /HYSTEROSCOPY;  Surgeon: Brayton Mars, MD;  Location: [facility] ORS;  Service: Gynecology;  Laterality: N/A;  . TUBAL LIGATION      Current Outpatient Prescriptions on File Prior to Visit  Medication Sig Dispense Refill  . chlorpheniramine-HYDROcodone (TUSSIONEX PENNKINETIC ER) 10-8 MG/5ML SUER Take 5 mLs by mouth 2 (two) times daily. 115 mL 0  . HYDROcodone-acetaminophen (NORCO/VICODIN) 5-325 MG tablet Take 1-2 tablets by mouth every 6 (six) hours as needed. 6 tablet 0  . ibrutinib (IMBRUVICA) 140 MG capsul Take 3 capsules (420 mg total) by mouth daily. 90 capsule 5  . oseltamivir (TAMIFLU) 75 MG capsule Take 1 capsule (75 mg total) by mouth every 12 (twelve) hours. 10 capsule 0   No current facility-administered medications on file prior to visit.     No Known Allergies  Social History    Social History  . Marital status: Legally Separated    Spouse name: N/A  . Number of children: N/A  . Years of education: N/A   Occupational History  . Not on file.   Social History Main Topics  . Smoking status: Never Smoker  . Smokeless tobacco: Never Used  . Alcohol use No  . Drug use: No  . Sexual activity: Not Currently    Birth control/ protection: Surgical   Other Topics Concern  . Not on file   Social History Narrative  . No narrative on file    Family History  Problem Relation Age of Onset  . Stroke Mother 29    CVA  . Diabetes Mother   . Deep vein thrombosis Brother 45    recurrent DVTs.  . Stroke Maternal Grandmother   . Colon cancer Maternal Grandmother   . Stroke Maternal Grandfather   . Diabetes Maternal Grandfather   . Diabetes Sister   . Breast cancer Neg Hx   . Ovarian cancer Neg Hx     The following portions of the patient's history were reviewed and updated as appropriate: allergies, current medications, past family history, past medical history, past social history, past surgical history and  problem list.  Review of Systems ROS Review of Systems - General ROS: negative for - chills, fatigue, fever, hot flashes, night sweats, weight gain or weight loss Psychological ROS: negative for - anxiety, decreased libido, depression, mood swings, physical abuse or sexual abuse Ophthalmic ROS: negative for - blurry vision, eye pain or loss of vision ENT ROS: negative for - headaches, hearing change, visual changes or vocal changes Allergy and Immunology ROS: negative for - hives, itchy/watery eyes or seasonal allergies Hematological and Lymphatic ROS: negative for - bleeding problems, bruising, swollen lymph nodes or weight loss Endocrine ROS: negative for - galactorrhea, hair pattern changes, hot flashes, malaise/lethargy, mood swings, palpitations, polydipsia/polyuria, skin changes, temperature intolerance or unexpected weight changes Breast ROS:  negative for - new or changing breast lumps or nipple discharge Respiratory ROS: negative for - cough or shortness of breath Cardiovascular ROS: negative for - chest pain, irregular heartbeat, palpitations or shortness of breath Gastrointestinal ROS: no abdominal pain, change in bowel habits, or black or bloody stools Genito-Urinary ROS: no dysuria, trouble voiding, or hematuria Musculoskeletal ROS: negative for - joint pain or joint stiffness Neurological ROS: negative for - bowel and bladder control changes Dermatological ROS: negative for rash and skin lesion changes   Objective:   There were no vitals taken for this visit. CONSTITUTIONAL: Well-developed, well-nourished female in no acute distress.  PSYCHIATRIC: Normal mood and affect. Normal behavior. Normal judgment and thought content. [place]: Alert and oriented to person, place, and time. Normal muscle tone coordination. No cranial nerve deficit noted. HENT:  Normocephalic, atraumatic, External right and left ear normal. Oropharynx is clear and moist EYES: Conjunctivae and EOM are normal. Pupils are equal, round, and reactive to light. No scleral icterus.  NECK: Normal range of motion, supple, no masses.  Normal thyroid.  SKIN: Skin is warm and dry. No rash noted. Not diaphoretic. No erythema. No pallor. CARDIOVASCULAR: Normal heart rate noted, regular rhythm, no murmur. RESPIRATORY: Clear to auscultation bilaterally. Effort and breath sounds normal, no problems with respiration noted. BREASTS: Symmetric in size. No masses, skin changes, nipple drainage, or lymphadenopathy. ABDOMEN: Soft, normal bowel sounds, no distention noted.  No tenderness, rebound or guarding.  BLADDER: Normal PELVIC:  External Genitalia: Normal  BUS: Normal  Vagina: Normal  Cervix: Normal  Uterus: Normal  Adnexa: Normal  RV: {Blank multiple:19196::"External Exam NormaI","No Rectal Masses","Normal Sphincter tone"}  MUSCULOSKELETAL: Normal range of  motion. No tenderness.  No cyanosis, clubbing, or edema.  2+ distal pulses. LYMPHATIC: No Axillary, Supraclavicular, or Inguinal Adenopathy.    Assessment:   Annual gynecologic examination 61 y.o. Contraception: tubal ligation bmi- Problem List Items Addressed This Visit    Menorrhagia with irregular cycle   Uterine leiomyoma   Status post D&C    Other Visit Diagnoses    Well woman exam with routine gynecological exam    -  Primary      Plan:  Pap: Pap Co Test Mammogram: Ordered Stool Guaiac Testing:  ? Labs: Lipid 1 vit d tsh fbs a1c Routine preventative health maintenance measures emphasized: {Blank multiple:19196::"Exercise/Diet/Weight control","Tobacco Warnings","Alcohol/Substance use risks","Stress Management","Peer Pressure Issues","Safe Sex"} *** Return to [place]cas [place], [place]

## 2016-10-15 ENCOUNTER — Encounter: Payer: Managed Care, Other (non HMO) | Admitting: Obstetrics and Gynecology

## 2016-10-20 NOTE — ED Provider Notes (Signed)
[facility]    CSN: [id number] Arrival date & time: 08/20/16  1613     History   Chief Complaint Chief Complaint  Patient presents with  . Headache    HPI Cathy [name] is a 61 y.o. female.   The history is provided by the patient.  Headache  Pain location:  Generalized Quality:  Dull Radiates to:  Does not radiate Duration:  4 days Timing:  Constant Progression:  Unchanged Chronicity:  New Similar to prior headaches: no   Relieved by:  Nothing Ineffective treatments:  Acetaminophen and NSAIDs Associated symptoms: no abdominal pain, no back pain, no blurred vision, no congestion, no cough, no diarrhea, no dizziness, no drainage, no ear pain, no eye pain, no facial pain, no fatigue, no fever, no focal weakness, no hearing loss, no loss of balance, no myalgias, no nausea, no near-syncope, no neck pain, no neck stiffness, no numbness, no paresthesias, no photophobia, no seizures, no sinus pressure, no sore throat, no swollen glands, no syncope, no tingling, no URI, no visual change, no vomiting and no weakness     Past Medical History:  Diagnosis Date  . Chest pain   . CLL (chronic lymphocytic leukemia) ([place])   . CLL (chronic lymphocytic leukemia) ([place])   . CLL (chronic lymphocytic leukemia) ([place])   . Complication of anesthesia    woke up with severe cramps in extremities (26 years ago)  . Hyperlipidemia     Patient Active Problem List   Diagnosis Date Noted  . Status post D&C 06/11/2016  . Endometrial polyp 06/11/2016  . Uterine leiomyoma 05/20/2016  . Thickened endometrium 05/20/2016  . Adnexal mass 04/30/2016  . Abdominal cramping 04/30/2016  . Menorrhagia with irregular cycle 04/30/2016  . CLL (chronic lymphocytic leukemia) ([place]) 12/15/2014  . Chest pain 12/01/2013    Past Surgical History:  Procedure Laterality Date  . HYSTEROSCOPY W/D&C N/A 06/02/2016   Procedure: DILATATION AND CURETTAGE /HYSTEROSCOPY;  Surgeon: Brayton Mars, MD;   Location: [facility] ORS;  Service: Gynecology;  Laterality: N/A;  . TUBAL LIGATION      OB History    Gravida Para Term Preterm AB Living   3 3 3     3    SAB TAB Ectopic Multiple Live Births           3       Home Medications    Prior to Admission medications   Medication Sig Start Date End Date Taking? Authorizing Provider  chlorpheniramine-HYDROcodone (TUSSIONEX PENNKINETIC ER) 10-8 MG/5ML SUER Take 5 mLs by mouth 2 (two) times daily. 10/02/16   Lorin Picket, PA-C  HYDROcodone-acetaminophen (NORCO/VICODIN) 5-325 MG tablet Take 1-2 tablets by mouth every 6 (six) hours as needed. 08/20/16   Norval Gable, MD  ibrutinib (IMBRUVICA) 140 MG capsul Take 3 capsules (420 mg total) by mouth daily. 02/27/16   Lloyd Huger, MD  oseltamivir (TAMIFLU) 75 MG capsule Take 1 capsule (75 mg total) by mouth every 12 (twelve) hours. 10/02/16   Lorin Picket, PA-C    Family History Family History  Problem Relation Age of Onset  . Stroke Mother 54    CVA  . Diabetes Mother   . Deep vein thrombosis Brother 45    recurrent DVTs.  . Stroke Maternal Grandmother   . Colon cancer Maternal Grandmother   . Stroke Maternal Grandfather   . Diabetes Maternal Grandfather   . Diabetes Sister   . Breast cancer Neg Hx   . Ovarian cancer  Neg Hx     Social History Social History  Substance Use Topics  . Smoking status: Never Smoker  . Smokeless tobacco: Never Used  . Alcohol use No     Allergies   Patient has no known allergies.   Review of Systems Review of Systems  Constitutional: Negative for fatigue and fever.  HENT: Negative for congestion, ear pain, hearing loss, postnasal drip, sinus pressure and sore throat.   Eyes: Negative for blurred vision, photophobia and pain.  Respiratory: Negative for cough.   Cardiovascular: Negative for syncope and near-syncope.  Gastrointestinal: Negative for abdominal pain, diarrhea, nausea and vomiting.  Musculoskeletal: Negative for back pain,  myalgias, neck pain and neck stiffness.  Neurological: Positive for headaches. Negative for dizziness, focal weakness, seizures, weakness, numbness, paresthesias and loss of balance.     Physical Exam Triage Vital Signs ED Triage Vitals  Enc Vitals Group     BP 08/20/16 1657 (!) 129/56     Pulse Rate 08/20/16 1657 81     Resp 08/20/16 1657 16     Temp 08/20/16 1657 97 F (36.1 C)     Temp Source 08/20/16 1657 Tympanic     SpO2 08/20/16 1657 99 %     Weight 08/20/16 1658 148 lb (67.1 kg)     Height 08/20/16 1658 5' 5.5" (1.664 m)     Head Circumference --      Peak Flow --      Pain Score 08/20/16 1659 0     Pain Loc --      Pain Edu? --      Excl. in [place]? --    No data found.   Updated Vital Signs BP (!) 129/56   Pulse 81   Temp 97 F (36.1 C) (Tympanic)   Resp 16   Ht 5' 5.5" (1.664 m)   Wt 148 lb (67.1 kg)   LMP 08/07/2016   SpO2 99%   BMI 24.25 kg/m   Visual Acuity Right Eye Distance:   Left Eye Distance:   Bilateral Distance:    Right Eye Near:   Left Eye Near:    Bilateral Near:     Physical Exam  Constitutional: She is oriented to person, place, and time. She appears well-developed and well-nourished. No distress.  HENT:  Head: Normocephalic and atraumatic.  Right Ear: Tympanic membrane, external ear and ear canal normal.  Left Ear: Tympanic membrane, external ear and ear canal normal.  Nose: No mucosal edema, rhinorrhea, nose lacerations, sinus tenderness, nasal deformity, septal deviation or nasal septal hematoma. No epistaxis.  No foreign bodies. Right sinus exhibits no maxillary sinus tenderness and no frontal sinus tenderness. Left sinus exhibits no maxillary sinus tenderness and no frontal sinus tenderness.  Mouth/Throat: Uvula is midline, oropharynx is clear and moist and mucous membranes are normal. No oropharyngeal exudate.  Eyes: Conjunctivae and EOM are normal. Pupils are equal, round, and reactive to light. Right eye exhibits no discharge.  Left eye exhibits no discharge. No scleral icterus.  Neck: Normal range of motion. Neck supple. No tracheal deviation present. No thyromegaly present.  Cardiovascular: Normal rate, regular rhythm and normal heart sounds.   Pulmonary/Chest: Effort normal and breath sounds normal. No stridor. No respiratory distress. She has no wheezes. She has no rales.  Lymphadenopathy:    She has no cervical adenopathy.  Neurological: She is alert and oriented to person, place, and time. She displays normal reflexes. No cranial nerve deficit or sensory deficit. She exhibits normal muscle  tone. Coordination normal.  Skin: She is not diaphoretic.  Nursing note and vitals reviewed.    UC Treatments / Results  Labs (all labs ordered are listed, but only abnormal results are displayed) Labs Reviewed - No data to display  EKG  EKG Interpretation None       Radiology No results found.  Procedures Procedures (including critical care time)  Medications Ordered in UC Medications - No data to display   Initial Impression / Assessment and Plan / UC Course  I have reviewed the triage vital signs and the nursing notes.  Pertinent labs & imaging results that were available during my care of the patient were reviewed by me and considered in my medical decision making (see chart for details).       Final Clinical Impressions(s) / UC Diagnoses   Final diagnoses:  Other headache syndrome  Varicella without complication    New Prescriptions Discharge Medication List as of 08/20/2016  5:50 PM    START taking these medications   Details  HYDROcodone-acetaminophen (NORCO/VICODIN) 5-325 MG tablet Take 1-2 tablets by mouth every 6 (six) hours as needed., Starting Wed 08/20/2016, Print       1.  diagnosis reviewed with patient 2. rx as per orders above; reviewed possible side effects, interactions, risks and benefits  3. Recommend supportive treatment with otc analgesics prn, rest 4. Follow-up prn if  symptoms worsen or don't improve   Norval Gable, MD 10/20/16 1308

## 2016-10-22 ENCOUNTER — Ambulatory Visit (INDEPENDENT_AMBULATORY_CARE_PROVIDER_SITE_OTHER): Payer: 59 | Admitting: Obstetrics and Gynecology

## 2016-10-22 ENCOUNTER — Encounter: Payer: Self-pay | Admitting: Obstetrics and Gynecology

## 2016-10-22 VITALS — BP 112/68 | HR 82 | Ht 65.5 in | Wt 140.2 lb

## 2016-10-22 DIAGNOSIS — D259 Leiomyoma of uterus, unspecified: Secondary | ICD-10-CM

## 2016-10-22 DIAGNOSIS — C9110 Chronic lymphocytic leukemia of B-cell type not having achieved remission: Secondary | ICD-10-CM

## 2016-10-22 DIAGNOSIS — N852 Hypertrophy of uterus: Secondary | ICD-10-CM

## 2016-10-22 DIAGNOSIS — Z01419 Encounter for gynecological examination (general) (routine) without abnormal findings: Secondary | ICD-10-CM | POA: Diagnosis not present

## 2016-10-22 DIAGNOSIS — Z1239 Encounter for other screening for malignant neoplasm of breast: Secondary | ICD-10-CM

## 2016-10-22 DIAGNOSIS — R590 Localized enlarged lymph nodes: Secondary | ICD-10-CM | POA: Diagnosis not present

## 2016-10-22 DIAGNOSIS — Z1211 Encounter for screening for malignant neoplasm of colon: Secondary | ICD-10-CM

## 2016-10-22 DIAGNOSIS — Z1231 Encounter for screening mammogram for malignant neoplasm of breast: Secondary | ICD-10-CM | POA: Diagnosis not present

## 2016-10-22 NOTE — Progress Notes (Signed)
ANNUAL PREVENTATIVE CARE GYN  ENCOUNTER NOTE  Subjective:       Cathy [name] is a 61 y.o. G30P3003 female here for a routine annual gynecologic exam.  Current complaints: 1.  None  Patient has long history of CLL; currently she is off chemotherapy for her condition. She has not had follow-up in oncology for several months due to recent infections including chickenpox and the fluid.  The patient continues to have regular menstrual cycles. Intervals range from 4 weeks to 8 weeks. Duration of flow typically is 6-7 days and they are considered heavy. Patient has not had a Pap smear in approximately 30 years. Patient has never had a mammogram. The patient saw me in the past year for evaluation of abnormal uterine bleeding; underwent hysteroscopy/D&C with findings of benign endometrial polyps. She is now experiencing regular menstrual cycles. She is not experiencing any vasomotor symptoms Patient does have past history of uterine fibroids found on ultrasound on 05/14/2016. Fibroids 2 measuring 3.0 and 2.8 cm respectively.   Gynecologic History LMP- 10/13/2016 Contraception: tubal ligation Last Pap: 30 years ago. Results were: normal Last mammogram: never.   Obstetric History OB History  Gravida Para Term Preterm AB Living  3 3 3     3   SAB TAB Ectopic Multiple Live Births          3    # Outcome Date GA Lbr Len/2nd Weight Sex Delivery Anes PTL Lv  3 Term 1990   7 lb (3.175 kg) F Vag-Spont   LIV  2 Term 1989   7 lb (3.175 kg) F Vag-Spont   LIV  1 Term 1987   8 lb 1.8 oz (3.679 kg) M Vag-Spont   LIV      Past Medical History:  Diagnosis Date  . Chest pain   . CLL (chronic lymphocytic leukemia) ([place])   . CLL (chronic lymphocytic leukemia) ([place])   . CLL (chronic lymphocytic leukemia) ([place])   . Complication of anesthesia    woke up with severe cramps in extremities (26 years ago)  . Hyperlipidemia     Past Surgical History:  Procedure Laterality Date  . HYSTEROSCOPY W/D&C N/A  06/02/2016   Procedure: DILATATION AND CURETTAGE /HYSTEROSCOPY;  Surgeon: Brayton Mars, MD;  Location: [facility] ORS;  Service: Gynecology;  Laterality: N/A;  . TUBAL LIGATION      Current Outpatient Prescriptions on File Prior to Visit  Medication Sig Dispense Refill  . chlorpheniramine-HYDROcodone (TUSSIONEX PENNKINETIC ER) 10-8 MG/5ML SUER Take 5 mLs by mouth 2 (two) times daily. 115 mL 0  . HYDROcodone-acetaminophen (NORCO/VICODIN) 5-325 MG tablet Take 1-2 tablets by mouth every 6 (six) hours as needed. 6 tablet 0  . ibrutinib (IMBRUVICA) 140 MG capsul Take 3 capsules (420 mg total) by mouth daily. 90 capsule 5  . oseltamivir (TAMIFLU) 75 MG capsule Take 1 capsule (75 mg total) by mouth every 12 (twelve) hours. 10 capsule 0   No current facility-administered medications on file prior to visit.     No Known Allergies  Social History   Social History  . Marital status: Legally Separated    Spouse name: N/A  . Number of children: N/A  . Years of education: N/A   Occupational History  . Not on file.   Social History Main Topics  . Smoking status: Never Smoker  . Smokeless tobacco: Never Used  . Alcohol use No  . Drug use: No  . Sexual activity: Not Currently    Birth control/ protection:  Surgical   Other Topics Concern  . Not on file   Social History Narrative  . No narrative on file    Family History  Problem Relation Age of Onset  . Stroke Mother 31    CVA  . Diabetes Mother   . Deep vein thrombosis Brother 45    recurrent DVTs.  . Stroke Maternal Grandmother   . Colon cancer Maternal Grandmother   . Stroke Maternal Grandfather   . Diabetes Maternal Grandfather   . Diabetes Sister   . Breast cancer Neg Hx   . Ovarian cancer Neg Hx     The following portions of the patient's history were reviewed and updated as appropriate: allergies, current medications, past family history, past medical history, past social history, past surgical history and problem  list.  Review of Systems Review of Systems  Constitutional: Positive for malaise/fatigue. Negative for chills, diaphoresis, fever and weight loss.  HENT: Negative.   Eyes: Negative.   Respiratory: Negative.   Cardiovascular: Negative.   Gastrointestinal: Negative.        Bowel function normal  Genitourinary: Negative.  Negative for urgency.       Bladder function normal  Musculoskeletal: Negative.   Skin: Negative for itching and rash.  Neurological: Negative.   Endo/Heme/Allergies: Negative.   Psychiatric/Behavioral: Negative.     Objective:   BP 112/68   Pulse 82   Ht 5' 5.5" (1.664 m)   Wt 140 lb 3.2 oz (63.6 kg)   BMI 22.98 kg/m  CONSTITUTIONAL: Well-developed, well-nourished female in no acute distress.  PSYCHIATRIC: Normal mood and affect. Normal behavior. Normal judgment and thought content. [place]: Alert and oriented to person, place, and time. Normal muscle tone coordination. No cranial nerve deficit noted. HENT:  Normocephalic, atraumatic, External right and left ear normal.  EYES: Conjunctivae and EOM are normal.  No scleral icterus.  NECK: Normal range of motion, supple, no masses.  Normal thyroid.  SKIN: Skin is warm and dry. No rash noted. Not diaphoretic. No erythema. No pallor. CARDIOVASCULAR: Normal heart rate noted, regular rhythm, no murmur. RESPIRATORY: Clear to auscultation bilaterally. Effort and breath sounds normal, no problems with respiration noted. BREASTS: Symmetric in size. No masses, skin changes, nipple drainage, or lymphadenopathy. ABDOMEN: Soft, normal bowel sounds, no distention noted.  No tenderness, rebound or guarding.  BLADDER: Normal PELVIC:  External Genitalia: Normal  BUS: Normal  Vagina: Normal Appearing mucosa; scarring between posterior vagina and cervix-bandlike is present  Cervix: Normal; no cervical motion tenderness; 5 mm cervical polyp is noted  Uterus: Mid plane, irregular, approximately 8-10 weeks size with left sided  prominence, mobile  Adnexa: Normal; nonpalpable and nontender  RV: External Exam NormaI, No Rectal Masses and Normal Sphincter tone  MUSCULOSKELETAL: Normal range of motion. No tenderness.  No cyanosis, clubbing, or edema.  2+ distal pulses. LYMPHATIC: No Supraclavicular adenopathy; right axillary lymph node 2 x 1 cm palpated, mobile, nontender; bilateral inguinal adenopathy measuring 2 x 3 cm, nontender    Assessment:   Annual gynecologic examination 61 y.o. Contraception: tubal ligation Normal BMI  History of CLL, off chemotherapy at this time Lymphadenopathy in the groin, enlarged recently; isolated right axillary lymph node palpable No history of mammogram No recent Pap smear in 30 years History of abnormal uterine bleeding, with D&C notable for endometrial polyp Uterine fibroids  Plan:  Pap: Pap Co Test Mammogram: Ordered Stool Guaiac Testing:  Will do colonoscopy Labs: vit d lipid tsh a1c fbs Routine preventative health maintenance measures emphasized:  Exercise/Diet/Weight control, Tobacco Warnings and Alcohol/Substance use risks Pelvic ultrasound Follow-up in 2 weeks after ultrasound for review of ultrasound and mammogram Return to [place], CMA  Brayton Mars, MD  Note: This dictation was prepared with Dragon dictation along with smaller phrase technology. Any transcriptional errors that result from this process are unintentional.

## 2016-10-22 NOTE — Patient Instructions (Signed)
1. Pap smear is completed 2. Mammogram is ordered 3. Colonoscopy is ordered 4. Continue with healthy eating and exercise 5. Pelvic ultrasound is scheduled 6. Return in 2 weeks for follow-up after ultrasound is completed 7. Return in 1 year for annual exam  Health Maintenance, Female Adopting a healthy lifestyle and getting preventive care can go a long way to promote health and wellness. Talk with your health care provider about what schedule of regular examinations is right for you. This is a good chance for you to check in with your provider about disease prevention and staying healthy. In between checkups, there are plenty of things you can do on your own. Experts have done a lot of research about which lifestyle changes and preventive measures are most likely to keep you healthy. Ask your health care provider for more information. Weight and diet Eat a healthy diet  Be sure to include plenty of vegetables, fruits, low-fat dairy products, and lean protein.  Do not eat a lot of foods high in solid fats, added sugars, or salt.  Get regular exercise. This is one of the most important things you can do for your health.  Most adults should exercise for at least 150 minutes each week. The exercise should increase your heart rate and make you sweat (moderate-intensity exercise).  Most adults should also do strengthening exercises at least twice a week. This is in addition to the moderate-intensity exercise. Maintain a healthy weight  Body mass index (BMI) is a measurement that can be used to identify possible weight problems. It estimates body fat based on height and weight. Your health care provider can help determine your BMI and help you achieve or maintain a healthy weight.  For females 46 years of age and older:  A BMI below 18.5 is considered underweight.  A BMI of 18.5 to 24.9 is normal.  A BMI of 25 to 29.9 is considered overweight.  A BMI of 30 and above is considered  obese. Watch levels of cholesterol and blood lipids  You should start having your blood tested for lipids and cholesterol at 59 years of age, then have this test every 5 years.  You may need to have your cholesterol levels checked more often if:  Your lipid or cholesterol levels are high.  You are older than 58 years of age.  You are at high risk for heart disease. Cancer screening Lung Cancer  Lung cancer screening is recommended for adults 32-55 years old who are at high risk for lung cancer because of a history of smoking.  A yearly low-dose CT scan of the lungs is recommended for people who:  Currently smoke.  Have quit within the past 15 years.  Have at least a 30-pack-year history of smoking. A pack year is smoking an average of one pack of cigarettes a day for 1 year.  Yearly screening should continue until it has been 15 years since you quit.  Yearly screening should stop if you develop a health problem that would prevent you from having lung cancer treatment. Breast Cancer  Practice breast self-awareness. This means understanding how your breasts normally appear and feel.  It also means doing regular breast self-exams. Let your health care provider know about any changes, no matter how small.  If you are in your 20s or 30s, you should have a clinical breast exam (CBE) by a health care provider every 1-3 years as part of a regular health exam.  If you are 40 or  older, have a CBE every year. Also consider having a breast X-ray (mammogram) every year.  If you have a family history of breast cancer, talk to your health care provider about genetic screening.  If you are at high risk for breast cancer, talk to your health care provider about having an MRI and a mammogram every year.  Breast cancer gene (BRCA) assessment is recommended for women who have family members with BRCA-related cancers. BRCA-related cancers include:  Breast.  Ovarian.  Tubal.  Peritoneal  cancers.  Results of the assessment will determine the need for genetic counseling and BRCA1 and BRCA2 testing. Cervical Cancer  Your health care provider may recommend that you be screened regularly for cancer of the pelvic organs (ovaries, uterus, and vagina). This screening involves a pelvic examination, including checking for microscopic changes to the surface of your cervix (Pap test). You may be encouraged to have this screening done every 3 years, beginning at age 64.  For women ages 24-65, health care providers may recommend pelvic exams and Pap testing every 3 years, or they may recommend the Pap and pelvic exam, combined with testing for human papilloma virus (HPV), every 5 years. Some types of HPV increase your risk of cervical cancer. Testing for HPV may also be done on women of any age with unclear Pap test results.  Other health care providers may not recommend any screening for nonpregnant women who are considered low risk for pelvic cancer and who do not have symptoms. Ask your health care provider if a screening pelvic exam is right for you.  If you have had past treatment for cervical cancer or a condition that could lead to cancer, you need Pap tests and screening for cancer for at least 20 years after your treatment. If Pap tests have been discontinued, your risk factors (such as having a new sexual partner) need to be reassessed to determine if screening should resume. Some women have medical problems that increase the chance of getting cervical cancer. In these cases, your health care provider may recommend more frequent screening and Pap tests. Colorectal Cancer  This type of cancer can be detected and often prevented.  Routine colorectal cancer screening usually begins at 59 years of age and continues through 59 years of age.  Your health care provider may recommend screening at an earlier age if you have risk factors for colon cancer.  Your health care provider may also  recommend using home test kits to check for hidden blood in the stool.  A small camera at the end of a tube can be used to examine your colon directly (sigmoidoscopy or colonoscopy). This is done to check for the earliest forms of colorectal cancer.  Routine screening usually begins at age 70.  Direct examination of the colon should be repeated every 5-10 years through 59 years of age. However, you may need to be screened more often if early forms of precancerous polyps or small growths are found. Skin Cancer  Check your skin from head to toe regularly.  Tell your health care provider about any new moles or changes in moles, especially if there is a change in a mole's shape or color.  Also tell your health care provider if you have a mole that is larger than the size of a pencil eraser.  Always use sunscreen. Apply sunscreen liberally and repeatedly throughout the day.  Protect yourself by wearing long sleeves, pants, a wide-brimmed hat, and sunglasses whenever you are outside. Heart disease,  diabetes, and high blood pressure  High blood pressure causes heart disease and increases the risk of stroke. High blood pressure is more likely to develop in:  People who have blood pressure in the high end of the normal range (130-139/85-89 mm Hg).  People who are overweight or obese.  People who are African American.  If you are 70-110 years of age, have your blood pressure checked every 3-5 years. If you are 28 years of age or older, have your blood pressure checked every year. You should have your blood pressure measured twice-once when you are at a hospital or clinic, and once when you are not at a hospital or clinic. Record the average of the two measurements. To check your blood pressure when you are not at a hospital or clinic, you can use:  An automated blood pressure machine at a pharmacy.  A home blood pressure monitor.  If you are between 3 years and 70 years old, ask your health  care provider if you should take aspirin to prevent strokes.  Have regular diabetes screenings. This involves taking a blood sample to check your fasting blood sugar level.  If you are at a normal weight and have a low risk for diabetes, have this test once every three years after 58 years of age.  If you are overweight and have a high risk for diabetes, consider being tested at a younger age or more often. Preventing infection Hepatitis B  If you have a higher risk for hepatitis B, you should be screened for this virus. You are considered at high risk for hepatitis B if:  You were born in a country where hepatitis B is common. Ask your health care provider which countries are considered high risk.  Your parents were born in a high-risk country, and you have not been immunized against hepatitis B (hepatitis B vaccine).  You have HIV or AIDS.  You use needles to inject street drugs.  You live with someone who has hepatitis B.  You have had sex with someone who has hepatitis B.  You get hemodialysis treatment.  You take certain medicines for conditions, including cancer, organ transplantation, and autoimmune conditions. Hepatitis C  Blood testing is recommended for:  Everyone born from [birth year] through 1963.  Anyone with known risk factors for hepatitis C. Sexually transmitted infections (STIs)  You should be screened for sexually transmitted infections (STIs) including gonorrhea and chlamydia if:  You are sexually active and are younger than 59 years of age.  You are older than 59 years of age and your health care provider tells you that you are at risk for this type of infection.  Your sexual activity has changed since you were last screened and you are at an increased risk for chlamydia or gonorrhea. Ask your health care provider if you are at risk.  If you do not have HIV, but are at risk, it may be recommended that you take a prescription medicine daily to prevent HIV  infection. This is called pre-exposure prophylaxis (PrEP). You are considered at risk if:  You are sexually active and do not regularly use condoms or know the HIV status of your partner(s).  You take drugs by injection.  You are sexually active with a partner who has HIV. Talk with your health care provider about whether you are at high risk of being infected with HIV. If you choose to begin PrEP, you should first be tested for HIV. You should then be tested  every 3 months for as long as you are taking PrEP. Pregnancy  If you are premenopausal and you may become pregnant, ask your health care provider about preconception counseling.  If you may become pregnant, take 400 to 800 micrograms (mcg) of folic acid every day.  If you want to prevent pregnancy, talk to your health care provider about birth control (contraception). Osteoporosis and menopause  Osteoporosis is a disease in which the bones lose minerals and strength with aging. This can result in serious bone fractures. Your risk for osteoporosis can be identified using a bone density scan.  If you are 74 years of age or older, or if you are at risk for osteoporosis and fractures, ask your health care provider if you should be screened.  Ask your health care provider whether you should take a calcium or vitamin D supplement to lower your risk for osteoporosis.  Menopause may have certain physical symptoms and risks.  Hormone replacement therapy may reduce some of these symptoms and risks. Talk to your health care provider about whether hormone replacement therapy is right for you. Follow these instructions at home:  Schedule regular health, dental, and eye exams.  Stay current with your immunizations.  Do not use any tobacco products including cigarettes, chewing tobacco, or electronic cigarettes.  If you are pregnant, do not drink alcohol.  If you are breastfeeding, limit how much and how often you drink alcohol.  Limit  alcohol intake to no more than 1 drink per day for nonpregnant women. One drink equals 12 ounces of beer, 5 ounces of wine, or 1 ounces of hard liquor.  Do not use street drugs.  Do not share needles.  Ask your health care provider for help if you need support or information about quitting drugs.  Tell your health care provider if you often feel depressed.  Tell your health care provider if you have ever been abused or do not feel safe at home. This information is not intended to replace advice given to you by your health care provider. Make sure you discuss any questions you have with your health care provider. Document Released: 02/17/2011 Document Revised: 01/10/2016 Document Reviewed: 05/08/2015 Elsevier Interactive Patient Education  2017 [employer].

## 2016-10-24 LAB — PAP IG AND HPV HIGH-RISK
HPV, HIGH-RISK: POSITIVE — AB
PAP Smear Comment: 0

## 2016-11-04 ENCOUNTER — Other Ambulatory Visit: Payer: 59

## 2016-11-04 ENCOUNTER — Ambulatory Visit (INDEPENDENT_AMBULATORY_CARE_PROVIDER_SITE_OTHER): Payer: 59

## 2016-11-04 DIAGNOSIS — R590 Localized enlarged lymph nodes: Secondary | ICD-10-CM | POA: Diagnosis not present

## 2016-11-04 DIAGNOSIS — N852 Hypertrophy of uterus: Secondary | ICD-10-CM | POA: Diagnosis not present

## 2016-11-09 NOTE — Progress Notes (Signed)
[place]  Telephone:(336) [phone] Fax:(336) [phone]  ID: Riesa Pope OB: 1956-06-12  MR#: [id number]  AOZ#:[id number]  Patient Care Team: Wardell Honour, MD as PCP - General (Family Medicine)  CHIEF COMPLAINT:  CLL, Rai stage 1  INTERVAL HISTORY: Patient returns to clinic today for repeat laboratory work and further evaluation. She discontinued Imbruvica about 3 months ago, but did not inform anybody. She continues to work full-time. She has no neurologic complaints. She denies any weight loss.  She has no chest pain and denies any shortness of breath or cough.  She denies any nausea, vomiting, constipation, or diarrhea.  She has no melena or hematochezia.  She has no urinary complaints.  Patient offers no further specific complaints today.  REVIEW OF SYSTEMS:   Review of Systems  Constitutional: Positive for malaise/fatigue. Negative for diaphoresis, fever and weight loss.  Respiratory: Negative.  Negative for cough and shortness of breath.   Cardiovascular: Negative for chest pain and leg swelling.  Gastrointestinal: Negative.   Genitourinary: Negative.   Musculoskeletal: Negative.   Neurological: Negative for sensory change, weakness and headaches.  Psychiatric/Behavioral: Negative.     As per HPI. Otherwise, a complete review of systems is negative.  PAST MEDICAL HISTORY: Past Medical History:  Diagnosis Date  . Chest pain   . CLL (chronic lymphocytic leukemia) ([place])   . CLL (chronic lymphocytic leukemia) ([place])   . CLL (chronic lymphocytic leukemia) ([place])   . Complication of anesthesia    woke up with severe cramps in extremities (26 years ago)  . Hyperlipidemia     PAST SURGICAL HISTORY: Past Surgical History:  Procedure Laterality Date  . HYSTEROSCOPY W/D&C N/A 06/02/2016   Procedure: DILATATION AND CURETTAGE /HYSTEROSCOPY;  Surgeon: Brayton Mars, MD;  Location: [facility] ORS;  Service: Gynecology;  Laterality: N/A;  . TUBAL LIGATION        FAMILY HISTORY Family History  Problem Relation Age of Onset  . Stroke Mother 11    CVA  . Diabetes Mother   . Deep vein thrombosis Brother 45    recurrent DVTs.  . Stroke Maternal Grandmother   . Colon cancer Maternal Grandmother   . Stroke Maternal Grandfather   . Diabetes Maternal Grandfather   . Diabetes Sister   . Breast cancer Neg Hx   . Ovarian cancer Neg Hx        ADVANCED DIRECTIVES:    HEALTH MAINTENANCE: Social History  Substance Use Topics  . Smoking status: Never Smoker  . Smokeless tobacco: Never Used  . Alcohol use No     No Known Allergies  No current outpatient prescriptions on file.   No current facility-administered medications for this visit.     OBJECTIVE: Vitals:   11/11/16 1526  BP: 110/72  Pulse: 73  Resp: 18  Temp: (!) 95.7 F (35.4 C)     Body mass index is 23.15 kg/m.    ECOG FS:0 - Asymptomatic  General: Well-developed, well-nourished, no acute distress. Eyes: anicteric sclera. HEENT: Cervical lymphadenopathy resolved Lungs: Clear to auscultation bilaterally. Heart: Regular rate and rhythm. No rubs, murmurs, or gallops. Abdomen: Soft, nontender, nondistended. No organomegaly noted, normoactive bowel sounds. Musculoskeletal: No edema, cyanosis, or clubbing. Neuro: Alert, answering all questions appropriately. Cranial nerves grossly intact. Skin: No rashes or petechiae noted. Psych: Normal affect.    LAB RESULTS:  Lab Results  Component Value Date   NA 137 11/11/2016   K 3.8 11/11/2016   CL 104 11/11/2016   CO2  29 11/11/2016   GLUCOSE 129 (H) 11/11/2016   BUN 14 11/11/2016   CREATININE 0.79 11/11/2016   CALCIUM 8.9 11/11/2016   PROT 7.1 11/11/2016   ALBUMIN 4.0 11/11/2016   AST 28 11/11/2016   ALT 22 11/11/2016   ALKPHOS 63 11/11/2016   BILITOT 0.7 11/11/2016   GFRNONAA >60 11/11/2016   GFRAA >60 11/11/2016    Lab Results  Component Value Date   WBC 15.9 (H) 11/11/2016   NEUTROABS 3.5 11/11/2016    HGB 11.4 (L) 11/11/2016   HCT 34.2 (L) 11/11/2016   MCV 77.5 (L) 11/11/2016   PLT 240 11/11/2016     STUDIES: US Transvaginal Non-ob  Result Date: 11/10/2016 ULTRASOUND REPORT Location: [facility] Date of Service: 11/04/16 Indications:Enlarged Uterus Findings: The uterus measures 9.7 x 6.2 x 6.3 cm. Echo texture is heterogenous with evidence of focal masses. Within the uterus are multiple suspected fibroids measuring: Fibroid 1: Anterior right fundal, Intramural 1.7 x 1.6 x 1.7 cm Fibroid 2: Posterior right fundal, Intramural 1.6 x 1.5 x 1.6 cm Fibroid 3: Posterior Fundal, Intramural 2.5 x 2.5 x 2.6 cm The Endometrium measures 8.7 mm. Right Ovary measures 2.9 x 1.8 x 1.8 cm. It is normal in appearance. Left Ovary measures 3.2 x 2.3 x 1.6 cm. It is normal appearance. Survey of the adnexa demonstrates no adnexal masses. There is no free fluid in the cul de sac. Impression: 1. Multi Fibroid Uterus Recommendations: 1.Clinical correlation with the patient's History and Physical Exam. Pauline Good, MD    ASSESSMENT: CLL, Rai stage 1  PLAN:    1.  CLL: Peripheral blood flow cytometry confirmed the results. Patient completed her second round of 4 cycles of weekly Rituxan on Jan 16, 2016. Despite discontinuing Imbruvica really 3 months ago, her white count has significantly climbed and is now nearly within normal limits. She has no obvious evidence of lymphadenopathy either. Continue to hold treatment. Return to clinic in 6 weeks for laboratory work only and in 3 months for further evaluation. Patient has been instructed to discontinue allopurinol as well.  2. Pedal edema: Mild, monitor.   Approximately 30 minutes was spent in discussion of which greater than 50% was consultation.  Patient expressed understanding and was in agreement with this plan. She also understands that She can call clinic at any time with any questions, concerns, or complaints.    Lloyd Huger, MD   11/18/2016 10:37 AM

## 2016-11-10 ENCOUNTER — Other Ambulatory Visit: Payer: 59

## 2016-11-11 ENCOUNTER — Inpatient Hospital Stay: Payer: 59 | Attending: Oncology

## 2016-11-11 ENCOUNTER — Encounter: Payer: Self-pay | Admitting: Obstetrics and Gynecology

## 2016-11-11 ENCOUNTER — Ambulatory Visit (INDEPENDENT_AMBULATORY_CARE_PROVIDER_SITE_OTHER): Payer: 59 | Admitting: Obstetrics and Gynecology

## 2016-11-11 ENCOUNTER — Inpatient Hospital Stay (HOSPITAL_BASED_OUTPATIENT_CLINIC_OR_DEPARTMENT_OTHER): Payer: 59 | Admitting: Oncology

## 2016-11-11 VITALS — BP 147/74 | HR 76 | Ht 65.5 in | Wt 140.7 lb

## 2016-11-11 VITALS — BP 110/72 | HR 73 | Temp 95.7°F | Resp 18 | Wt 141.2 lb

## 2016-11-11 DIAGNOSIS — R5381 Other malaise: Secondary | ICD-10-CM | POA: Insufficient documentation

## 2016-11-11 DIAGNOSIS — R600 Localized edema: Secondary | ICD-10-CM

## 2016-11-11 DIAGNOSIS — R5383 Other fatigue: Secondary | ICD-10-CM | POA: Insufficient documentation

## 2016-11-11 DIAGNOSIS — E559 Vitamin D deficiency, unspecified: Secondary | ICD-10-CM

## 2016-11-11 DIAGNOSIS — E785 Hyperlipidemia, unspecified: Secondary | ICD-10-CM | POA: Insufficient documentation

## 2016-11-11 DIAGNOSIS — C9110 Chronic lymphocytic leukemia of B-cell type not having achieved remission: Secondary | ICD-10-CM | POA: Insufficient documentation

## 2016-11-11 DIAGNOSIS — R87810 Cervical high risk human papillomavirus (HPV) DNA test positive: Secondary | ICD-10-CM | POA: Diagnosis not present

## 2016-11-11 DIAGNOSIS — Z9221 Personal history of antineoplastic chemotherapy: Secondary | ICD-10-CM

## 2016-11-11 DIAGNOSIS — D259 Leiomyoma of uterus, unspecified: Secondary | ICD-10-CM | POA: Diagnosis not present

## 2016-11-11 LAB — CBC WITH DIFFERENTIAL/PLATELET
BASOS PCT: 0 %
Basophils Absolute: 0.1 10*3/uL (ref 0–0.1)
Eosinophils Absolute: 0.1 10*3/uL (ref 0–0.7)
Eosinophils Relative: 1 %
HEMATOCRIT: 34.2 % — AB (ref 35.0–47.0)
HEMOGLOBIN: 11.4 g/dL — AB (ref 12.0–16.0)
Lymphocytes Relative: 74 %
Lymphs Abs: 11.8 10*3/uL — ABNORMAL HIGH (ref 1.0–3.6)
MCH: 25.7 pg — AB (ref 26.0–34.0)
MCHC: 33.2 g/dL (ref 32.0–36.0)
MCV: 77.5 fL — ABNORMAL LOW (ref 80.0–100.0)
MONOS PCT: 3 %
Monocytes Absolute: 0.4 10*3/uL (ref 0.2–0.9)
Neutro Abs: 3.5 10*3/uL (ref 1.4–6.5)
Neutrophils Relative %: 22 %
Platelets: 240 10*3/uL (ref 150–440)
RBC: 4.42 MIL/uL (ref 3.80–5.20)
RDW: 20.4 % — AB (ref 11.5–14.5)
WBC: 15.9 10*3/uL — ABNORMAL HIGH (ref 3.6–11.0)

## 2016-11-11 LAB — COMPREHENSIVE METABOLIC PANEL
ALBUMIN: 4 g/dL (ref 3.5–5.0)
ALK PHOS: 63 U/L (ref 38–126)
ALT: 22 U/L (ref 14–54)
ANION GAP: 4 — AB (ref 5–15)
AST: 28 U/L (ref 15–41)
BILIRUBIN TOTAL: 0.7 mg/dL (ref 0.3–1.2)
BUN: 14 mg/dL (ref 6–20)
CO2: 29 mmol/L (ref 22–32)
Calcium: 8.9 mg/dL (ref 8.9–10.3)
Chloride: 104 mmol/L (ref 101–111)
Creatinine, Ser: 0.79 mg/dL (ref 0.44–1.00)
GFR calc non Af Amer: >60 mL/min (ref >60)
GLUCOSE: 129 mg/dL — AB (ref 65–99)
POTASSIUM: 3.8 mmol/L (ref 3.5–5.1)
Sodium: 137 mmol/L (ref 135–145)
TOTAL PROTEIN: 7.1 g/dL (ref 6.5–8.1)

## 2016-11-11 LAB — LIPID PANEL
CHOLESTEROL TOTAL: 201 mg/dL — AB (ref 100–199)
Chol/HDL Ratio: 3.2 ratio units (ref 0.0–4.4)
HDL: 63 mg/dL (ref >39)
LDL CALC: 125 mg/dL — AB (ref 0–99)
Triglycerides: 67 mg/dL (ref 0–149)
VLDL CHOLESTEROL CAL: 13 mg/dL (ref 5–40)

## 2016-11-11 LAB — VITAMIN D 25 HYDROXY (VIT D DEFICIENCY, FRACTURES): Vit D, 25-Hydroxy: 16.1 ng/mL — ABNORMAL LOW (ref 30.0–100.0)

## 2016-11-11 LAB — PHOSPHORUS: PHOSPHORUS: 3.5 mg/dL (ref 2.5–4.6)

## 2016-11-11 LAB — HEMOGLOBIN A1C
Est. average glucose Bld gHb Est-mCnc: 111 mg/dL
Hgb A1c MFr Bld: 5.5 % (ref 4.8–5.6)

## 2016-11-11 LAB — MAGNESIUM: MAGNESIUM: 2.1 mg/dL (ref 1.7–2.4)

## 2016-11-11 LAB — TSH: TSH: 3.66 u[IU]/mL (ref 0.450–4.500)

## 2016-11-11 LAB — GLUCOSE, RANDOM: Glucose: 88 mg/dL (ref 65–99)

## 2016-11-11 NOTE — Progress Notes (Signed)
Chief complaint: 1. Colposcopy 2. Follow-up on ultrasound 3. Follow-up on screening labs  Patient presents for follow-up. Recent ultrasound demonstrates 3 intrauterine fibroids, all likely intramural. Compared to prior ultrasound, no significant interval change on total size of uterus. No adnexal masses are identified.  Recent Pap smear demonstrated positive high risk HPV; patient is here for colposcopy  Patient has had screening lab work done and is requesting results review. Review of all labs drawn demonstrate slightly low vitamin D level and slightly elevated total cholesterol and LDL cholesterol.  OBJECTIVE: BP (!) 147/74   Pulse 76   Ht 5' 5.5" (1.664 m)   Wt 140 lb 11.2 oz (63.8 kg)   LMP 10/13/2016 (Exact Date)   BMI 23.06 kg/m  Pleasant female in no acute distress. Alert and oriented. Pelvic exam: External genitalia-normal US-normal Vagina-fair estrogen effect Cervix-no gross lesions; cervix is deviated to posterior left aspect of cul-de-sac, and requires single-tooth tenaculum to positioned correctly for colposcopy  PROCEDURE: Colposcopy of upper adjacent vagina and cervix with biopsies and ECC  Indications: Also virus HPV Pap smear Findings: Inadequate colposcopy with difficulty in visualization of SCJ; small cervical polyp 5 mm at 12:00; question of punctation at 9:00 Biopsies:  ECC  Cervix 12:00  Cervix 9:00 Description: Verbal consent is given. Patient was placed in dorsal lithotomy position. Speculum was placed to facilitate visualization of the cervix. Acetic acid is applied Fox swabs. The cervix appears deviated posteriorly and to the left and single-tooth tenaculum was used to reposition cervix at the central location within the vagina. The tenaculum is maintained at 12:00 throughout the procedure. ECC is performed with a serrated curet; Cytobrush is used to help remove additional cells and mucus. Cervical biopsy at 12:00 is performed (which removes the  endocervical polyp). Cervical biopsy at 9:00 is performed in the region of possible punctation. Monsel solution is applied to biopsy sites for hemostasis. Procedures well-tolerated. Blood loss is minimal.  ASSESSMENT: 1. Multi-fibroid uterus, with minimal change from serial ultrasounds 2. Positive high risk HPV Pap smear 3. Inadequate colposcopy today 4. Lab reviewed and notable for low vitamin D level and borderline elevated total cholesterol and LDL cholesterol.  PLAN: 1. Colposcopy with biopsies as noted 2. Results from colposcopy will be made available through my chart 3. Ultrasound results were reviewed; questions answered 4. Screening laboratory results were reviewed and recommendations given 5. Return in 6 months for Pap smear  A total of 15 minutes were spent face-to-face with the patient during this encounter and over half of that time dealt with counseling and coordination of care.  Brayton Mars, MD  Note: This dictation was prepared with Dragon dictation along with smaller phrase technology. Any transcriptional errors that result from this process are unintentional.

## 2016-11-11 NOTE — Progress Notes (Signed)
Patient offers no complaints today. 

## 2016-11-11 NOTE — Patient Instructions (Addendum)
1. Colposcopy with biopsies is completed today 2. Return in 6 months for Pap smear 3. Results will be made available from biopsies my chart 4. Maintain healthy eating and exercise with repeating lipid panel in 1 year 5. Recommend vitamin D supplementation 1000 units daily   Colposcopy, Care After This sheet gives you information about how to care for yourself after your procedure. Your health care provider may also give you more specific instructions. If you have problems or questions, contact your health care provider. What can I expect after the procedure? If you had a colposcopy without a biopsy, you can expect to feel fine right away, but you may have some spotting for a few days. You can go back to your normal activities. If you had a colposcopy with a biopsy, it is common to have:  Soreness and pain. This may last for a few days.  Light-headedness.  Mild vaginal bleeding or dark-colored, grainy discharge. This may last for a few days. The discharge may be due to a solution that was used during the procedure. You may need to wear a sanitary pad during this time.  Spotting for at least 48 hours after the procedure. Follow these instructions at home:  Take over-the-counter and prescription medicines only as told by your health care provider. Talk with your health care provider about what type of over-the-counter pain medicine and prescription medicine you can start taking again. It is especially important to talk with your health care provider if you take blood-thinning medicine.  Do not drive or use heavy machinery while taking prescription pain medicine.  For at least 3 days after your procedure, or as long as told by your health care provider, avoid:  Douching.  Using tampons.  Having sexual intercourse.  Continue to use birth control (contraception).  Limit your physical activity for the first day after the procedure as told by your health care provider. Ask your health  care provider what activities are safe for you.  It is up to you to get the results of your procedure. Ask your health care provider, or the department performing the procedure, when your results will be ready.  Keep all follow-up visits as told by your health care provider. This is important. Contact a health care provider if:  You develop a skin rash. Get help right away if:  You are bleeding heavily from your vagina or you are passing blood clots. This includes using more than one sanitary pad per hour for 2 hours in a row.  You have a fever or chills.  You have pelvic pain.  You have abnormal, yellow-colored, or bad-smelling vaginal discharge. This could be a sign of infection.  You have severe pain or cramps in your lower abdomen that do not get better with medicine.  You feel light-headed or dizzy, or you faint. Summary  If you had a colposcopy without a biopsy, you can expect to feel fine right away, but you may have some spotting for a few days. You can go back to your normal activities.  If you had a colposcopy with a biopsy, you may notice mild pain and spotting for 48 hours after the procedure.  Avoid douching, using tampons, and having sexual intercourse for 3 days after the procedure or as long as told by your health care provider.  Contact your health care provider if you have bleeding, severe pain, or signs of infection. This information is not intended to replace advice given to you by your health  care provider. Make sure you discuss any questions you have with your health care provider. Document Released: 05/25/2013 Document Revised: 03/21/2016 Document Reviewed: 03/21/2016 Elsevier Interactive Patient Education  2017 [employer].

## 2016-11-14 LAB — PATHOLOGY

## 2016-12-23 ENCOUNTER — Other Ambulatory Visit: Payer: Self-pay | Admitting: Oncology

## 2016-12-23 ENCOUNTER — Telehealth: Payer: Self-pay | Admitting: *Deleted

## 2016-12-23 DIAGNOSIS — C9110 Chronic lymphocytic leukemia of B-cell type not having achieved remission: Secondary | ICD-10-CM

## 2016-12-23 NOTE — Telephone Encounter (Signed)
Per Dr Grayland Ormond, schedule CT chest abd, pelvis and neck with and without contrast.. Order entered, message sent to scheduling

## 2016-12-23 NOTE — Telephone Encounter (Signed)
Asking that Dr Grayland Ormond go ahead and order the CT's discussed at her last visit. States her lymph nodes are very tender to touch, she is aching all over and having muscle spasms in her neck. Please advise

## 2016-12-25 ENCOUNTER — Inpatient Hospital Stay: Payer: 59 | Attending: Oncology

## 2016-12-25 DIAGNOSIS — C9110 Chronic lymphocytic leukemia of B-cell type not having achieved remission: Secondary | ICD-10-CM | POA: Diagnosis not present

## 2016-12-25 DIAGNOSIS — Z9221 Personal history of antineoplastic chemotherapy: Secondary | ICD-10-CM | POA: Insufficient documentation

## 2016-12-25 LAB — COMPREHENSIVE METABOLIC PANEL
ALBUMIN: 4.1 g/dL (ref 3.5–5.0)
ALT: 26 U/L (ref 14–54)
AST: 30 U/L (ref 15–41)
Alkaline Phosphatase: 60 U/L (ref 38–126)
Anion gap: 3 — ABNORMAL LOW (ref 5–15)
BUN: 19 mg/dL (ref 6–20)
CHLORIDE: 107 mmol/L (ref 101–111)
CO2: 28 mmol/L (ref 22–32)
CREATININE: 0.9 mg/dL (ref 0.44–1.00)
Calcium: 9 mg/dL (ref 8.9–10.3)
GFR calc non Af Amer: >60 mL/min (ref >60)
GLUCOSE: 70 mg/dL (ref 65–99)
Potassium: 4.3 mmol/L (ref 3.5–5.1)
SODIUM: 138 mmol/L (ref 135–145)
Total Bilirubin: 0.5 mg/dL (ref 0.3–1.2)
Total Protein: 7 g/dL (ref 6.5–8.1)

## 2016-12-25 LAB — CBC WITH DIFFERENTIAL/PLATELET
BASOS ABS: 0.1 10*3/uL (ref 0–0.1)
BASOS PCT: 0 %
EOS ABS: 0.1 10*3/uL (ref 0–0.7)
EOS PCT: 0 %
HCT: 34.8 % — ABNORMAL LOW (ref 35.0–47.0)
Hemoglobin: 11.5 g/dL — ABNORMAL LOW (ref 12.0–16.0)
Lymphocytes Relative: 80 %
Lymphs Abs: 13 10*3/uL — ABNORMAL HIGH (ref 1.0–3.6)
MCH: 26.1 pg (ref 26.0–34.0)
MCHC: 33.1 g/dL (ref 32.0–36.0)
MCV: 78.9 fL — ABNORMAL LOW (ref 80.0–100.0)
Monocytes Absolute: 0.5 10*3/uL (ref 0.2–0.9)
Monocytes Relative: 3 %
NEUTROS PCT: 17 %
Neutro Abs: 2.8 10*3/uL (ref 1.4–6.5)
PLATELETS: 232 10*3/uL (ref 150–440)
RBC: 4.41 MIL/uL (ref 3.80–5.20)
RDW: 16.1 % — ABNORMAL HIGH (ref 11.5–14.5)
WBC: 16.5 10*3/uL — AB (ref 3.6–11.0)

## 2016-12-25 LAB — PHOSPHORUS: Phosphorus: 3.5 mg/dL (ref 2.5–4.6)

## 2016-12-25 LAB — MAGNESIUM: MAGNESIUM: 2 mg/dL (ref 1.7–2.4)

## 2016-12-30 ENCOUNTER — Ambulatory Visit: Payer: 59

## 2016-12-30 ENCOUNTER — Ambulatory Visit
Admission: RE | Admit: 2016-12-30 | Discharge: 2016-12-30 | Disposition: A | Discharge status: Home / Self Care | Payer: 59 | Source: Ambulatory Visit | Attending: Oncology | Admitting: Oncology

## 2016-12-30 DIAGNOSIS — C9190 Lymphoid leukemia, unspecified not having achieved remission: Secondary | ICD-10-CM | POA: Insufficient documentation

## 2016-12-30 DIAGNOSIS — C9110 Chronic lymphocytic leukemia of B-cell type not having achieved remission: Secondary | ICD-10-CM | POA: Insufficient documentation

## 2016-12-30 DIAGNOSIS — R161 Splenomegaly, not elsewhere classified: Secondary | ICD-10-CM | POA: Diagnosis not present

## 2016-12-30 MED ORDER — IOPAMIDOL (ISOVUE-300) INJECTION 61%
100.0000 mL | Freq: Once | INTRAVENOUS | Status: AC | PRN
  Administered 2016-12-30: 100 mL via INTRAVENOUS

## 2017-02-11 NOTE — Progress Notes (Signed)
[place]  Telephone:([phone] [phone]) [phone]  ID: Riesa Pope OB: May 13, 1956  MR#: [id number]  IRC#:[id number]  Patient Care Team: Wardell Honour, MD as PCP - General (Family Medicine)  CHIEF COMPLAINT:  CLL, Rai stage 1  INTERVAL HISTORY: Patient returns to clinic today for repeat laboratory work and discussion of her imaging results. She has noticed some increased weakness and fatigue recently. She denies any fevers, night sweats, or weight loss. She continues to work full-time. She has no neurologic complaints. She has no chest pain and denies any shortness of breath or cough.  She denies any nausea, vomiting, constipation, or diarrhea.  She has no melena or hematochezia.  She has no urinary complaints.  Patient offers no further specific complaints today.  REVIEW OF SYSTEMS:   Review of Systems  Constitutional: Positive for malaise/fatigue. Negative for diaphoresis, fever and weight loss.  Respiratory: Negative.  Negative for cough and shortness of breath.   Cardiovascular: Negative for chest pain and leg swelling.  Gastrointestinal: Negative.   Genitourinary: Negative.   Musculoskeletal: Negative.   Neurological: Positive for weakness. Negative for sensory change and headaches.  Psychiatric/Behavioral: Negative.     As per HPI. Otherwise, a complete review of systems is negative.  PAST MEDICAL HISTORY: Past Medical History:  Diagnosis Date  . Chest pain   . CLL (chronic lymphocytic leukemia) ([place])   . CLL (chronic lymphocytic leukemia) ([place])   . CLL (chronic lymphocytic leukemia) ([place])   . Complication of anesthesia    woke up with severe cramps in extremities (26 years ago)  . Hyperlipidemia     PAST SURGICAL HISTORY: Past Surgical History:  Procedure Laterality Date  . HYSTEROSCOPY W/D&C N/A 06/02/2016   Procedure: DILATATION AND CURETTAGE /HYSTEROSCOPY;  Surgeon: Brayton Mars, MD;  Location: [facility] ORS;  Service: Gynecology;   Laterality: N/A;  . TUBAL LIGATION      FAMILY HISTORY Family History  Problem Relation Age of Onset  . Stroke Mother 21       CVA  . Diabetes Mother   . Deep vein thrombosis Brother 45       recurrent DVTs.  . Stroke Maternal Grandmother   . Colon cancer Maternal Grandmother   . Stroke Maternal Grandfather   . Diabetes Maternal Grandfather   . Diabetes Sister   . Breast cancer Neg Hx   . Ovarian cancer Neg Hx        ADVANCED DIRECTIVES:    HEALTH MAINTENANCE: Social History  Substance Use Topics  . Smoking status: Never Smoker  . Smokeless tobacco: Never Used  . Alcohol use No     No Known Allergies  No current outpatient prescriptions on file.   No current facility-administered medications for this visit.     OBJECTIVE: Vitals:   02/12/17 1554  BP: 128/72  Pulse: 78  Resp: 20  Temp: 98.4 F (36.9 C)     Body mass index is 23.55 kg/m.    ECOG FS:0 - Asymptomatic  General: Well-developed, well-nourished, no acute distress. Eyes: anicteric sclera. HEENT: Cervical lymphadenopathy resolved Lungs: Clear to auscultation bilaterally. Heart: Regular rate and rhythm. No rubs, murmurs, or gallops. Abdomen: Soft, nontender, nondistended. No organomegaly noted, normoactive bowel sounds. Musculoskeletal: No edema, cyanosis, or clubbing. Neuro: Alert, answering all questions appropriately. Cranial nerves grossly intact. Skin: No rashes or petechiae noted. Psych: Normal affect.    LAB RESULTS:  Lab Results  Component Value Date   NA 136 02/12/2017   K 3.7  02/12/2017   CL 103 02/12/2017   CO2 26 02/12/2017   GLUCOSE 131 (H) 02/12/2017   BUN 16 02/12/2017   CREATININE 0.98 02/12/2017   CALCIUM 8.9 02/12/2017   PROT 7.1 02/12/2017   ALBUMIN 4.3 02/12/2017   AST 28 02/12/2017   ALT 23 02/12/2017   ALKPHOS 60 02/12/2017   BILITOT 0.6 02/12/2017   GFRNONAA >60 02/12/2017   GFRAA >60 02/12/2017    Lab Results  Component Value Date   WBC 35.7 (H)  02/12/2017   NEUTROABS 4.3 02/12/2017   HGB 11.9 (L) 02/12/2017   HCT 36.1 02/12/2017   MCV 80.7 02/12/2017   PLT 230 02/12/2017     STUDIES: No results found.  ASSESSMENT: CLL, Rai stage 1  PLAN:    1.  CLL: Peripheral blood flow cytometry confirmed the results. Patient completed her second round of 4 cycles of weekly Rituxan on Jan 16, 2016. CT scan results reviewed independently and reported as above with significant progression of disease with lymphadenopathy. Patient's blood cell count is also now increasing. Patient does not wish to reinitiate Imbruvica, but has agreed to treatment using Treanda and Rituxan. Return to clinic in mid July to initiate cycle 1, day 1 of treatment.  2. Pedal edema: Mild, monitor.   Approximately 30 minutes was spent in discussion of which greater than 50% was consultation.  Patient expressed understanding and was in agreement with this plan. She also understands that She can call clinic at any time with any questions, concerns, or complaints.    Lloyd Huger, MD   02/16/2017 8:46 AM

## 2017-02-12 ENCOUNTER — Inpatient Hospital Stay (HOSPITAL_BASED_OUTPATIENT_CLINIC_OR_DEPARTMENT_OTHER): Payer: 59 | Admitting: Oncology

## 2017-02-12 ENCOUNTER — Inpatient Hospital Stay: Payer: 59 | Attending: Oncology

## 2017-02-12 VITALS — BP 128/72 | HR 78 | Temp 98.4°F | Resp 20 | Wt 143.7 lb

## 2017-02-12 DIAGNOSIS — Z800 Family history of malignant neoplasm of digestive organs: Secondary | ICD-10-CM

## 2017-02-12 DIAGNOSIS — E785 Hyperlipidemia, unspecified: Secondary | ICD-10-CM | POA: Diagnosis not present

## 2017-02-12 DIAGNOSIS — R600 Localized edema: Secondary | ICD-10-CM

## 2017-02-12 DIAGNOSIS — R5383 Other fatigue: Secondary | ICD-10-CM

## 2017-02-12 DIAGNOSIS — R5381 Other malaise: Secondary | ICD-10-CM

## 2017-02-12 DIAGNOSIS — Z79899 Other long term (current) drug therapy: Secondary | ICD-10-CM

## 2017-02-12 DIAGNOSIS — C9110 Chronic lymphocytic leukemia of B-cell type not having achieved remission: Secondary | ICD-10-CM

## 2017-02-12 DIAGNOSIS — R531 Weakness: Secondary | ICD-10-CM | POA: Insufficient documentation

## 2017-02-12 DIAGNOSIS — Z823 Family history of stroke: Secondary | ICD-10-CM | POA: Insufficient documentation

## 2017-02-12 LAB — CBC WITH DIFFERENTIAL/PLATELET
BASOS ABS: 0 10*3/uL (ref 0–0.1)
Basophils Relative: 0 %
EOS PCT: 0 %
Eosinophils Absolute: 0 10*3/uL (ref 0–0.7)
HEMATOCRIT: 36.1 % (ref 35.0–47.0)
HEMOGLOBIN: 11.9 g/dL — AB (ref 12.0–16.0)
Lymphocytes Relative: 86 %
Lymphs Abs: 30.7 10*3/uL — ABNORMAL HIGH (ref 1.0–3.6)
MCH: 26.5 pg (ref 26.0–34.0)
MCHC: 32.8 g/dL (ref 32.0–36.0)
MCV: 80.7 fL (ref 80.0–100.0)
MONOS PCT: 2 %
Monocytes Absolute: 0.7 10*3/uL (ref 0.2–0.9)
NEUTROS PCT: 12 %
Neutro Abs: 4.3 10*3/uL (ref 1.4–6.5)
Platelets: 230 10*3/uL (ref 150–440)
RBC: 4.48 MIL/uL (ref 3.80–5.20)
RDW: 16.4 % — AB (ref 11.5–14.5)
WBC: 35.7 10*3/uL — AB (ref 3.6–11.0)

## 2017-02-12 LAB — COMPREHENSIVE METABOLIC PANEL
ALBUMIN: 4.3 g/dL (ref 3.5–5.0)
ALT: 23 U/L (ref 14–54)
ANION GAP: 7 (ref 5–15)
AST: 28 U/L (ref 15–41)
Alkaline Phosphatase: 60 U/L (ref 38–126)
BILIRUBIN TOTAL: 0.6 mg/dL (ref 0.3–1.2)
BUN: 16 mg/dL (ref 6–20)
CALCIUM: 8.9 mg/dL (ref 8.9–10.3)
CHLORIDE: 103 mmol/L (ref 101–111)
CO2: 26 mmol/L (ref 22–32)
CREATININE: 0.98 mg/dL (ref 0.44–1.00)
GFR calc Af Amer: >60 mL/min (ref >60)
GFR calc non Af Amer: >60 mL/min (ref >60)
Glucose, Bld: 131 mg/dL — ABNORMAL HIGH (ref 65–99)
Potassium: 3.7 mmol/L (ref 3.5–5.1)
SODIUM: 136 mmol/L (ref 135–145)
Total Protein: 7.1 g/dL (ref 6.5–8.1)

## 2017-02-12 NOTE — Progress Notes (Signed)
Patient here today for CT results. 

## 2017-02-16 ENCOUNTER — Telehealth: Payer: Self-pay

## 2017-02-16 MED ORDER — PROCHLORPERAZINE MALEATE 10 MG PO TABS: 10.0000 mg | ORAL_TABLET | Freq: Four times a day (QID) | ORAL | 1 refills | Status: DC | PRN

## 2017-02-16 MED ORDER — ACYCLOVIR 400 MG PO TABS: 400.0000 mg | ORAL_TABLET | Freq: Every day | ORAL | 3 refills | Status: DC

## 2017-02-16 MED ORDER — ONDANSETRON HCL 8 MG PO TABS: 8.0000 mg | ORAL_TABLET | Freq: Two times a day (BID) | ORAL | 1 refills | Status: DC | PRN

## 2017-02-16 NOTE — Telephone Encounter (Signed)
Patient called about would like more information about her treatment regimen she will be starting. She would like to know any side effects and any problems.

## 2017-03-01 ENCOUNTER — Other Ambulatory Visit: Payer: Self-pay | Admitting: Oncology

## 2017-03-01 NOTE — Progress Notes (Signed)
[place]  Telephone:(336) [phone] Fax:(336) [phone]  ID: Cathy [name] OB: 1956/07/24  MR#: [id number]  KPT#:[id number]  Patient Care Team: Wardell Honour, MD as PCP - General (Family Medicine)  CHIEF COMPLAINT:  CLL, Rai stage 1  INTERVAL HISTORY: Patient returns to clinic today for repeat laboratory work and initiation of cycle 1 of Rituxan and [place]. She continues to have mild weakness and fatigue. She also noted some increased nausea this past week. She denies any fevers, night sweats, or weight loss. She continues to work full-time. She has no neurologic complaints. She has no chest pain and denies any shortness of breath or cough.  She denies any vomiting, constipation, or diarrhea.  She has no melena or hematochezia.  She has no urinary complaints.  Patient offers no further specific complaints today.  REVIEW OF SYSTEMS:   Review of Systems  Constitutional: Positive for malaise/fatigue. Negative for diaphoresis, fever and weight loss.  Respiratory: Negative.  Negative for cough and shortness of breath.   Cardiovascular: Negative for chest pain and leg swelling.  Gastrointestinal: Positive for nausea. Negative for abdominal pain and constipation.  Genitourinary: Negative.   Musculoskeletal: Negative.   Neurological: Positive for weakness. Negative for sensory change and headaches.  Psychiatric/Behavioral: Negative.  The patient is not nervous/anxious.     As per HPI. Otherwise, a complete review of systems is negative.  PAST MEDICAL HISTORY: Past Medical History:  Diagnosis Date  . Chest pain   . CLL (chronic lymphocytic leukemia) ([place])   . CLL (chronic lymphocytic leukemia) ([place])   . CLL (chronic lymphocytic leukemia) ([place])   . Complication of anesthesia    woke up with severe cramps in extremities (26 years ago)  . Hyperlipidemia     PAST SURGICAL HISTORY: Past Surgical History:  Procedure Laterality Date  . HYSTEROSCOPY W/D&C N/A  06/02/2016   Procedure: DILATATION AND CURETTAGE /HYSTEROSCOPY;  Surgeon: Brayton Mars, MD;  Location: [facility] ORS;  Service: Gynecology;  Laterality: N/A;  . TUBAL LIGATION      FAMILY HISTORY Family History  Problem Relation Age of Onset  . Stroke Mother 99       CVA  . Diabetes Mother   . Deep vein thrombosis Brother 45       recurrent DVTs.  . Stroke Maternal Grandmother   . Colon cancer Maternal Grandmother   . Stroke Maternal Grandfather   . Diabetes Maternal Grandfather   . Diabetes Sister   . Breast cancer Neg Hx   . Ovarian cancer Neg Hx        ADVANCED DIRECTIVES:    HEALTH MAINTENANCE: Social History  Substance Use Topics  . Smoking status: Never Smoker  . Smokeless tobacco: Never Used  . Alcohol use No     No Known Allergies  Current Outpatient Prescriptions  Medication Sig Dispense Refill  . acyclovir (ZOVIRAX) 400 MG tablet Take 1 tablet (400 mg total) by mouth daily. (Patient not taking: Reported on 03/02/2017) 30 tablet 3  . ondansetron (ZOFRAN) 8 MG tablet Take 1 tablet (8 mg total) by mouth 2 (two) times daily as needed for refractory nausea / vomiting. Start on day 2 after bendamustine chemotherapy. (Patient not taking: Reported on 03/02/2017) 30 tablet 1  . prochlorperazine (COMPAZINE) 10 MG tablet Take 1 tablet (10 mg total) by mouth every 6 (six) hours as needed (Nausea or vomiting). (Patient not taking: Reported on 03/02/2017) 30 tablet 1   No current facility-administered medications for this visit.  Facility-Administered Medications Ordered in Other Visits  Medication Dose Route Frequency Provider Last Rate Last Dose  . bendamustine (BENDEKA) 150 mg in sodium chloride 0.9 % 50 mL (2.6786 mg/mL) chemo infusion  90 mg/m2 (Treatment Plan Recorded) Intravenous Once Lloyd Huger, MD      . dexamethasone (DECADRON) injection 10 mg  10 mg Intravenous Once Lloyd Huger, MD   10 mg at 03/02/17 1055  . palonosetron (ALOXI) injection  0.25 mg  0.25 mg Intravenous Once Lloyd Huger, MD      . riTUXimab (RITUXAN) 700 mg in sodium chloride 0.9 % 250 mL (2.1875 mg/mL) chemo infusion  375 mg/m2 (Treatment Plan Recorded) Intravenous Once Lloyd Huger, MD        OBJECTIVE: Vitals:   03/02/17 0912  BP: 111/67  Pulse: 67  Resp: 18  Temp: 97.6 F (36.4 C)     Body mass index is 23.5 kg/m.    ECOG FS:0 - Asymptomatic  General: Well-developed, well-nourished, no acute distress. Eyes: anicteric sclera. HEENT: Cervical lymphadenopathy resolved Lungs: Clear to auscultation bilaterally. Heart: Regular rate and rhythm. No rubs, murmurs, or gallops. Abdomen: Soft, nontender, nondistended. No organomegaly noted, normoactive bowel sounds. Musculoskeletal: No edema, cyanosis, or clubbing. Neuro: Alert, answering all questions appropriately. Cranial nerves grossly intact. Skin: No rashes or petechiae noted. Psych: Normal affect.    LAB RESULTS:  Lab Results  Component Value Date   NA 139 03/02/2017   K 4.3 03/02/2017   CL 110 03/02/2017   CO2 24 03/02/2017   GLUCOSE 99 03/02/2017   BUN 17 03/02/2017   CREATININE 0.92 03/02/2017   CALCIUM 9.0 03/02/2017   PROT 7.1 03/02/2017   ALBUMIN 4.3 03/02/2017   AST 20 03/02/2017   ALT 14 03/02/2017   ALKPHOS 52 03/02/2017   BILITOT 0.4 03/02/2017   GFRNONAA >60 03/02/2017   GFRAA >60 03/02/2017    Lab Results  Component Value Date   WBC 57.1 (HH) 03/02/2017   NEUTROABS 3.1 03/02/2017   HGB 12.0 03/02/2017   HCT 36.5 03/02/2017   MCV 81.1 03/02/2017   PLT 270 03/02/2017     STUDIES: No results found.  ASSESSMENT: CLL, Rai stage 1  PLAN:    1.  CLL: Peripheral blood flow cytometry confirmed the results. Patient completed her second round of 4 cycles of weekly Rituxan on Jan 16, 2016. CT scan results reviewed independently with significant progression of disease with lymphadenopathy. Patient's white blood cell count is also now increasing. Patient  could not tolerate Imbruvica. Proceed with cycle 1 of Treanda and Rituxan. Return to clinic tomorrow for [place] only and weekly for laboratory work. She will also return to clinic in 1 week for further evaluation and to assess her toleration of treatment and then in 4 weeks for consideration of cycle 2. Plan to do 3-4 cycles then reimage.  2. Nausea: Continue antiemetics as needed.  Approximately 30 minutes was spent in discussion of which greater than 50% was consultation.  Patient expressed understanding and was in agreement with this plan. She also understands that She can call clinic at any time with any questions, concerns, or complaints.    Lloyd Huger, MD   03/02/2017 11:00 AM

## 2017-03-02 ENCOUNTER — Inpatient Hospital Stay: Payer: 59

## 2017-03-02 ENCOUNTER — Inpatient Hospital Stay: Payer: 59 | Attending: Oncology | Admitting: Oncology

## 2017-03-02 VITALS — BP 101/59 | HR 80 | Temp 97.3°F | Resp 18

## 2017-03-02 VITALS — BP 111/67 | HR 67 | Temp 97.6°F | Resp 18 | Wt 143.4 lb

## 2017-03-02 DIAGNOSIS — R110 Nausea: Secondary | ICD-10-CM | POA: Insufficient documentation

## 2017-03-02 DIAGNOSIS — C9190 Lymphoid leukemia, unspecified not having achieved remission: Secondary | ICD-10-CM | POA: Diagnosis not present

## 2017-03-02 DIAGNOSIS — D701 Agranulocytosis secondary to cancer chemotherapy: Secondary | ICD-10-CM | POA: Diagnosis not present

## 2017-03-02 DIAGNOSIS — Z5111 Encounter for antineoplastic chemotherapy: Secondary | ICD-10-CM | POA: Diagnosis not present

## 2017-03-02 DIAGNOSIS — Z800 Family history of malignant neoplasm of digestive organs: Secondary | ICD-10-CM | POA: Insufficient documentation

## 2017-03-02 DIAGNOSIS — R5381 Other malaise: Secondary | ICD-10-CM

## 2017-03-02 DIAGNOSIS — Z79899 Other long term (current) drug therapy: Secondary | ICD-10-CM | POA: Diagnosis not present

## 2017-03-02 DIAGNOSIS — C9110 Chronic lymphocytic leukemia of B-cell type not having achieved remission: Secondary | ICD-10-CM | POA: Diagnosis not present

## 2017-03-02 DIAGNOSIS — T451X5S Adverse effect of antineoplastic and immunosuppressive drugs, sequela: Secondary | ICD-10-CM | POA: Diagnosis not present

## 2017-03-02 DIAGNOSIS — R531 Weakness: Secondary | ICD-10-CM | POA: Insufficient documentation

## 2017-03-02 DIAGNOSIS — R5383 Other fatigue: Secondary | ICD-10-CM | POA: Diagnosis not present

## 2017-03-02 DIAGNOSIS — E785 Hyperlipidemia, unspecified: Secondary | ICD-10-CM | POA: Diagnosis not present

## 2017-03-02 LAB — CBC WITH DIFFERENTIAL/PLATELET
BASOS ABS: 0.1 10*3/uL (ref 0–0.1)
Basophils Relative: 0 %
Eosinophils Absolute: 0.1 10*3/uL (ref 0–0.7)
Eosinophils Relative: 0 %
HEMATOCRIT: 36.5 % (ref 35.0–47.0)
Hemoglobin: 12 g/dL (ref 12.0–16.0)
LYMPHS ABS: 52.8 10*3/uL — AB (ref 1.0–3.6)
Lymphocytes Relative: 93 %
MCH: 26.7 pg (ref 26.0–34.0)
MCHC: 33 g/dL (ref 32.0–36.0)
MCV: 81.1 fL (ref 80.0–100.0)
MONO ABS: 1 10*3/uL — AB (ref 0.2–0.9)
Monocytes Relative: 2 %
NEUTROS ABS: 3.1 10*3/uL (ref 1.4–6.5)
Neutrophils Relative %: 5 %
Platelets: 270 10*3/uL (ref 150–440)
RBC: 4.5 MIL/uL (ref 3.80–5.20)
RDW: 17.3 % — ABNORMAL HIGH (ref 11.5–14.5)
WBC: 57.1 10*3/uL — AB (ref 3.6–11.0)

## 2017-03-02 LAB — COMPREHENSIVE METABOLIC PANEL
ALK PHOS: 52 U/L (ref 38–126)
ALT: 14 U/L (ref 14–54)
AST: 20 U/L (ref 15–41)
Albumin: 4.3 g/dL (ref 3.5–5.0)
Anion gap: 5 (ref 5–15)
BILIRUBIN TOTAL: 0.4 mg/dL (ref 0.3–1.2)
BUN: 17 mg/dL (ref 6–20)
CALCIUM: 9 mg/dL (ref 8.9–10.3)
CO2: 24 mmol/L (ref 22–32)
CREATININE: 0.92 mg/dL (ref 0.44–1.00)
Chloride: 110 mmol/L (ref 101–111)
GFR calc Af Amer: >60 mL/min (ref >60)
Glucose, Bld: 99 mg/dL (ref 65–99)
Potassium: 4.3 mmol/L (ref 3.5–5.1)
Sodium: 139 mmol/L (ref 135–145)
TOTAL PROTEIN: 7.1 g/dL (ref 6.5–8.1)

## 2017-03-02 MED ORDER — SODIUM CHLORIDE 0.9 % IV SOLN
90.0000 mg/m2 | Freq: Once | INTRAVENOUS | Status: AC
  Administered 2017-03-02: 150 mg via INTRAVENOUS
  Filled 2017-03-02: qty 6

## 2017-03-02 MED ORDER — DEXAMETHASONE SODIUM PHOSPHATE 10 MG/ML IJ SOLN
10.0000 mg | Freq: Once | INTRAMUSCULAR | Status: AC
  Administered 2017-03-02: 10 mg via INTRAVENOUS
  Filled 2017-03-02: qty 1

## 2017-03-02 MED ORDER — PALONOSETRON HCL INJECTION 0.25 MG/5ML
0.2500 mg | Freq: Once | INTRAVENOUS | Status: AC
  Administered 2017-03-02: 0.25 mg via INTRAVENOUS

## 2017-03-02 MED ORDER — DIPHENHYDRAMINE HCL 25 MG PO CAPS
25.0000 mg | ORAL_CAPSULE | Freq: Once | ORAL | Status: AC
  Administered 2017-03-02: 25 mg via ORAL
  Filled 2017-03-02: qty 1

## 2017-03-02 MED ORDER — SODIUM CHLORIDE 0.9 % IV SOLN
Freq: Once | INTRAVENOUS | Status: AC
  Administered 2017-03-02: 11:00:00 via INTRAVENOUS
  Filled 2017-03-02: qty 1000

## 2017-03-02 MED ORDER — ACYCLOVIR 400 MG PO TABS: 400.0000 mg | ORAL_TABLET | Freq: Every day | ORAL | 3 refills | Status: DC

## 2017-03-02 MED ORDER — SODIUM CHLORIDE 0.9 % IV SOLN
375.0000 mg/m2 | Freq: Once | INTRAVENOUS | Status: AC
  Administered 2017-03-02: 700 mg via INTRAVENOUS
  Filled 2017-03-02: qty 50

## 2017-03-02 MED ORDER — ACETAMINOPHEN 325 MG PO TABS
650.0000 mg | ORAL_TABLET | Freq: Once | ORAL | Status: AC
  Administered 2017-03-02: 650 mg via ORAL
  Filled 2017-03-02: qty 2

## 2017-03-02 MED ORDER — PALONOSETRON HCL INJECTION 0.25 MG/5ML
INTRAVENOUS | Status: AC
  Filled 2017-03-02: qty 5

## 2017-03-02 MED ORDER — SODIUM CHLORIDE 0.9 % IV SOLN: 10.0000 mg | Freq: Once | INTRAVENOUS | Status: DC

## 2017-03-02 NOTE — Addendum Note (Signed)
Addended by: Luretha Murphy on: 03/02/2017 11:42 AM   Modules accepted: Orders

## 2017-03-02 NOTE — Progress Notes (Signed)
Patient is here for follow up, she is doing overall well. She does mention some abdominal bloating but is not constipated.

## 2017-03-03 ENCOUNTER — Inpatient Hospital Stay: Payer: 59

## 2017-03-03 VITALS — BP 113/69 | HR 65 | Temp 97.2°F | Resp 16

## 2017-03-03 DIAGNOSIS — C9110 Chronic lymphocytic leukemia of B-cell type not having achieved remission: Secondary | ICD-10-CM

## 2017-03-03 DIAGNOSIS — Z5111 Encounter for antineoplastic chemotherapy: Secondary | ICD-10-CM | POA: Diagnosis not present

## 2017-03-03 LAB — HEPATITIS B SURFACE ANTIBODY, QUANTITATIVE

## 2017-03-03 LAB — HEPATITIS B SURFACE ANTIGEN: HEP B S AG: NEGATIVE

## 2017-03-03 MED ORDER — DEXAMETHASONE SODIUM PHOSPHATE 10 MG/ML IJ SOLN
10.0000 mg | Freq: Once | INTRAMUSCULAR | Status: AC
Start: 2017-03-03 — End: 2017-03-03
  Administered 2017-03-03: 10 mg via INTRAVENOUS
  Filled 2017-03-03: qty 1

## 2017-03-03 MED ORDER — SODIUM CHLORIDE 0.9 % IV SOLN
Freq: Once | INTRAVENOUS | Status: AC
  Administered 2017-03-03: 10:00:00 via INTRAVENOUS
  Filled 2017-03-03: qty 1000

## 2017-03-03 MED ORDER — SODIUM CHLORIDE 0.9 % IV SOLN: 10.0000 mg | Freq: Once | INTRAVENOUS | Status: DC

## 2017-03-03 MED ORDER — SODIUM CHLORIDE 0.9 % IV SOLN
90.0000 mg/m2 | Freq: Once | INTRAVENOUS | Status: AC
  Administered 2017-03-03: 150 mg via INTRAVENOUS
  Filled 2017-03-03: qty 6

## 2017-03-04 ENCOUNTER — Encounter: Payer: Self-pay | Admitting: Family Medicine

## 2017-03-04 ENCOUNTER — Ambulatory Visit (INDEPENDENT_AMBULATORY_CARE_PROVIDER_SITE_OTHER): Payer: 59 | Admitting: Family Medicine

## 2017-03-04 VITALS — BP 104/62 | HR 77 | Temp 98.0°F | Resp 18 | Ht 65.75 in | Wt 145.0 lb

## 2017-03-04 DIAGNOSIS — C9190 Lymphoid leukemia, unspecified not having achieved remission: Secondary | ICD-10-CM

## 2017-03-04 DIAGNOSIS — B999 Unspecified infectious disease: Secondary | ICD-10-CM

## 2017-03-04 DIAGNOSIS — R109 Unspecified abdominal pain: Secondary | ICD-10-CM | POA: Diagnosis not present

## 2017-03-04 DIAGNOSIS — R530 Neoplastic (malignant) related fatigue: Secondary | ICD-10-CM

## 2017-03-04 DIAGNOSIS — R87810 Cervical high risk human papillomavirus (HPV) DNA test positive: Secondary | ICD-10-CM

## 2017-03-04 DIAGNOSIS — D259 Leiomyoma of uterus, unspecified: Secondary | ICD-10-CM

## 2017-03-04 DIAGNOSIS — E559 Vitamin D deficiency, unspecified: Secondary | ICD-10-CM | POA: Diagnosis not present

## 2017-03-04 DIAGNOSIS — E7800 Pure hypercholesterolemia, unspecified: Secondary | ICD-10-CM | POA: Diagnosis not present

## 2017-03-04 DIAGNOSIS — R938 Abnormal findings on diagnostic imaging of other specified body structures: Secondary | ICD-10-CM | POA: Diagnosis not present

## 2017-03-04 DIAGNOSIS — R9389 Abnormal findings on diagnostic imaging of other specified body structures: Secondary | ICD-10-CM

## 2017-03-04 DIAGNOSIS — C9110 Chronic lymphocytic leukemia of B-cell type not having achieved remission: Secondary | ICD-10-CM

## 2017-03-04 NOTE — Progress Notes (Signed)
Subjective:    Patient ID: Cathy [name], female    DOB: 1956-01-13, 61 y.o.   MRN: [id number]  03/04/2017  Establish Care   HPI This 61 y.o. female presents for to establish care.   Has recently realized that needs at PCP to handle non-CLL issues.  Has been undergoing treatment for CLL by Dr. Grayland Ormond.  Would like to review medical issues in the past two years:  Uterine bleeding: started bleeding profusely; sp D&C.  [place].   Had the flu; had a UTI.  Has gone to Urgent Care multiple times.  Had chicken pox.  Running constantly and unable to get well. When developed chicken pox, stopped chemo oral therapy.  When finally got in to see Dr. Grayland Ormond, upset with stopping therapy.  Now, just finished chemo yesterday and the day before.  Will repeat monthly infusions.  Scared of illnesses.  Wants to be prepared.  Daughter made pt go to Urgent Care for evaluation. Interested in [place] visits?  Lymphatic involvement of CLL.  Started developing excessive swelling in legs, face, arms. Mother with profound leg edema.  Unable to control swelling.  Feels tightness.    04/2016 going to OB/GYN for US transvaginal nonob.    Mindful of diet, exercise, and sleep.   Has muscle aches and cramping; feels like feels 400 pounds. Drinks excessive caffeine due to fatigue.  Loves caffeine.  Bottom of feet hurt.    Gets nauseated and dizziness.   Chest pain and SOB does not seem related to symptoms.     Review of Systems  Constitutional: Positive for fatigue. Negative for chills, diaphoresis and fever.  Eyes: Negative for visual disturbance.  Respiratory: Negative for cough and shortness of breath.   Cardiovascular: Negative for chest pain, palpitations and leg swelling.  Gastrointestinal: Negative for abdominal pain, constipation, diarrhea, nausea and vomiting.  Endocrine: Negative for cold intolerance, heat intolerance, polydipsia, polyphagia and polyuria.  Neurological: Positive for dizziness.  Negative for tremors, seizures, syncope, facial asymmetry, speech difficulty, weakness, light-headedness, numbness and headaches.  Psychiatric/Behavioral: Negative for dysphoric mood. The patient is not nervous/anxious.     Past Medical History:  Diagnosis Date  . Chest pain   . CLL (chronic lymphocytic leukemia) ([place])   . CLL (chronic lymphocytic leukemia) ([place])   . CLL (chronic lymphocytic leukemia) ([place])   . Complication of anesthesia    woke up with severe cramps in extremities (26 years ago)  . Hyperlipidemia    Past Surgical History:  Procedure Laterality Date  . HYSTEROSCOPY W/D&C N/A 06/02/2016   Procedure: DILATATION AND CURETTAGE /HYSTEROSCOPY;  Surgeon: Brayton Mars, MD;  Location: [facility] ORS;  Service: Gynecology;  Laterality: N/A;  . TUBAL LIGATION     No Known Allergies Current Outpatient Prescriptions  Medication Sig Dispense Refill  . acyclovir (ZOVIRAX) 400 MG tablet Take 1 tablet (400 mg total) by mouth daily. 30 tablet 3  . ondansetron (ZOFRAN) 8 MG tablet Take 1 tablet (8 mg total) by mouth 2 (two) times daily as needed for refractory nausea / vomiting. Start on day 2 after bendamustine chemotherapy. 30 tablet 1  . prochlorperazine (COMPAZINE) 10 MG tablet Take 1 tablet (10 mg total) by mouth every 6 (six) hours as needed (Nausea or vomiting). 30 tablet 1   No current facility-administered medications for this visit.    Social History   Social History  . Marital status: Legally Separated    Spouse name: N/A  . Number of children: N/A  . Years  of education: N/A   Occupational History  . Not on file.   Social History Main Topics  . Smoking status: Never Smoker  . Smokeless tobacco: Never Used  . Alcohol use No  . Drug use: No  . Sexual activity: Not Currently    Birth control/ protection: Surgical   Other Topics Concern  . Not on file   Social History Narrative  . No narrative on file   Family History  Problem Relation Age of Onset  .  Stroke Mother 69       CVA  . Diabetes Mother   . Deep vein thrombosis Brother 45       recurrent DVTs.  . Stroke Maternal Grandmother   . Colon cancer Maternal Grandmother   . Stroke Maternal Grandfather   . Diabetes Maternal Grandfather   . Diabetes Sister   . Breast cancer Neg Hx   . Ovarian cancer Neg Hx        Objective:    BP 104/62   Pulse 77   Temp 98 F (36.7 C) (Oral)   Resp 18   Ht 5' 5.75" (1.67 m)   Wt 145 lb (65.8 kg)   LMP 10/05/2016 (Approximate)   SpO2 98%   BMI 23.58 kg/m  Physical Exam  Constitutional: She is oriented to person, place, and time. She appears well-developed and well-nourished. No distress.  HENT:  Head: Normocephalic and atraumatic.  Right Ear: External ear normal.  Left Ear: External ear normal.  Nose: Nose normal.  Mouth/Throat: Oropharynx is clear and moist.  Eyes: Pupils are equal, round, and reactive to light. Conjunctivae and EOM are normal.  Neck: Normal range of motion. Neck supple. Carotid bruit is not present. No thyromegaly present.  Cardiovascular: Normal rate, regular rhythm, normal heart sounds and intact distal pulses.  Exam reveals no gallop and no friction rub.   No murmur heard. Pulmonary/Chest: Effort normal and breath sounds normal. She has no wheezes. She has no rales.  Abdominal: Soft. Bowel sounds are normal. She exhibits no distension and no mass. There is no tenderness. There is no rebound and no guarding.  Lymphadenopathy:    She has no cervical adenopathy.  Neurological: She is alert and oriented to person, place, and time. No cranial nerve deficit.  Skin: Skin is warm and dry. No rash noted. She is not diaphoretic. No erythema. No pallor.  Psychiatric: She has a normal mood and affect. Her behavior is normal.   Results for orders placed or performed in visit on 03/02/17  Hepatitis B surface antibody  Result Value Ref Range   Hepatitis B-Post <3.1 (L) Immunity>9.9 mIU/mL  Hepatitis B surface antigen    Result Value Ref Range   Hepatitis B Surface Ag Negative Negative   Depression screen PHQ 2/9 03/04/2017  Decreased Interest 0  Down, Depressed, Hopeless 0  PHQ - 2 Score 0       Assessment & Plan:   1. CLL (chronic lymphocytic leukemia) ([place])   2. Vitamin D deficiency   3. Pure hypercholesterolemia   4. Cervical high risk HPV (human papillomavirus) test positive   5. Thickened endometrium   6. Abdominal cramping   7. Uterine leiomyoma, unspecified location   8. Neoplastic malignant related fatigue   9. Recurrent infections    -reviewed medical history for the past two years.  Has suffered with recurrent infections; would discuss with Dr. Grayland Ormond if can proceed with age appropriate immunizations with current treatment of CLL. -suffering with fatigue despite  regular exercise, good sleep hygiene, good eating habits.  No suggestion of underlying depression/anxiety. -pt asked for primary care recommendations in [place], [place]. Suggestions provided. -prolonged face-to-face for 30 minutes with greater than 50% of time dedicated to counseling and coordination of care.   No orders of the defined types were placed in this encounter.  No orders of the defined types were placed in this encounter.   No Follow-up on file.   Kristi Elayne Guerin, M.D. Primary Care at [facility] [address] [place], [place]  [address] [phone] phone [phone] fax

## 2017-03-04 NOTE — Patient Instructions (Addendum)
  [place]: Deborra Medina, MD at [facility] on [address] Dr. Gilford Rile, at [facility] on [address]. Dr. Einar Pheasant at [facility] on [address]     IF you received an x-ray today, you will receive an invoice from [facility]. Please contact [facility] at [phone] with questions or concerns regarding your invoice.   IF you received labwork today, you will receive an invoice from [facility]. Please contact [facility] at [phone] with questions or concerns regarding your invoice.   Our billing staff will not be able to assist you with questions regarding bills from these companies.  You will be contacted with the lab results as soon as they are available. The fastest way to get your results is to activate your My Chart account. Instructions are located on the last page of this paperwork. If you have not heard from [place] regarding the results in 2 weeks, please contact this office.

## 2017-03-09 ENCOUNTER — Inpatient Hospital Stay: Payer: 59

## 2017-03-11 NOTE — Progress Notes (Signed)
[place]  Telephone:(336) [phone] Fax:(336) [phone]  ID: Cathy [name] OB: 21-Dec-1955  MR#: [id number]  WER#:[id number]  Patient Care Team: Wardell Honour, MD as PCP - General (Family Medicine)  CHIEF COMPLAINT:  CLL, Rai stage 1  INTERVAL HISTORY: Patient returns to clinic today for repeat laboratory work and to assess her toleration of cycle 1 of Rituxan and Treanda. She continues to have mild weakness and fatigue, but otherwise tolerated her treatment well. She continues to work full-time. She does not complain of any further nausea. She denies any fevers, night sweats, or weight loss. She has no neurologic complaints. She has no chest pain and denies any shortness of breath or cough.  She denies any vomiting, constipation, or diarrhea.  She has no melena or hematochezia.  She has no urinary complaints.  Patient offers no further specific complaints today.  REVIEW OF SYSTEMS:   Review of Systems  Constitutional: Positive for malaise/fatigue. Negative for diaphoresis, fever and weight loss.  Respiratory: Negative.  Negative for cough and shortness of breath.   Cardiovascular: Negative for chest pain and leg swelling.  Gastrointestinal: Negative for abdominal pain, constipation and nausea.  Genitourinary: Negative.   Musculoskeletal: Negative.   Neurological: Positive for weakness. Negative for sensory change and headaches.  Psychiatric/Behavioral: Negative.  The patient is not nervous/anxious.     As per HPI. Otherwise, a complete review of systems is negative.  PAST MEDICAL HISTORY: Past Medical History:  Diagnosis Date  . Chest pain   . CLL (chronic lymphocytic leukemia) ([place])   . CLL (chronic lymphocytic leukemia) ([place])   . CLL (chronic lymphocytic leukemia) ([place])   . Complication of anesthesia    woke up with severe cramps in extremities (26 years ago)  . Hyperlipidemia     PAST SURGICAL HISTORY: Past Surgical History:  Procedure Laterality  Date  . HYSTEROSCOPY W/D&C N/A 06/02/2016   Procedure: DILATATION AND CURETTAGE /HYSTEROSCOPY;  Surgeon: Brayton Mars, MD;  Location: [facility] ORS;  Service: Gynecology;  Laterality: N/A;  . TUBAL LIGATION      FAMILY HISTORY Family History  Problem Relation Age of Onset  . Stroke Mother 63       CVA  . Diabetes Mother   . Deep vein thrombosis Brother 45       recurrent DVTs.  . Stroke Maternal Grandmother   . Colon cancer Maternal Grandmother   . Stroke Maternal Grandfather   . Diabetes Maternal Grandfather   . Diabetes Sister   . Breast cancer Neg Hx   . Ovarian cancer Neg Hx        ADVANCED DIRECTIVES:    HEALTH MAINTENANCE: Social History  Substance Use Topics  . Smoking status: Never Smoker  . Smokeless tobacco: Never Used  . Alcohol use No     No Known Allergies  Current Outpatient Prescriptions  Medication Sig Dispense Refill  . acyclovir (ZOVIRAX) 400 MG tablet Take 1 tablet (400 mg total) by mouth daily. 30 tablet 3  . ondansetron (ZOFRAN) 8 MG tablet Take 1 tablet (8 mg total) by mouth 2 (two) times daily as needed for refractory nausea / vomiting. Start on day 2 after bendamustine chemotherapy. 30 tablet 1  . prochlorperazine (COMPAZINE) 10 MG tablet Take 1 tablet (10 mg total) by mouth every 6 (six) hours as needed (Nausea or vomiting). 30 tablet 1   No current facility-administered medications for this visit.     OBJECTIVE: Vitals:   03/12/17 1604  BP: 112/70  Pulse: 73  Resp: 20  Temp: 97.6 F (36.4 C)     Body mass index is 23.44 kg/m.    ECOG FS:0 - Asymptomatic  General: Well-developed, well-nourished, no acute distress. Eyes: anicteric sclera. HEENT: Cervical lymphadenopathy resolved Lungs: Clear to auscultation bilaterally. Heart: Regular rate and rhythm. No rubs, murmurs, or gallops. Abdomen: Soft, nontender, nondistended. No organomegaly noted, normoactive bowel sounds. Musculoskeletal: No edema, cyanosis, or clubbing. Neuro:  Alert, answering all questions appropriately. Cranial nerves grossly intact. Skin: No rashes or petechiae noted. Psych: Normal affect.    LAB RESULTS:  Lab Results  Component Value Date   NA 135 03/12/2017   K 3.5 03/12/2017   CL 103 03/12/2017   CO2 28 03/12/2017   GLUCOSE 97 03/12/2017   BUN 13 03/12/2017   CREATININE 0.79 03/12/2017   CALCIUM 8.8 (L) 03/12/2017   PROT 6.1 (L) 03/12/2017   ALBUMIN 3.9 03/12/2017   AST 17 03/12/2017   ALT 14 03/12/2017   ALKPHOS 41 03/12/2017   BILITOT 0.5 03/12/2017   GFRNONAA >60 03/12/2017   GFRAA >60 03/12/2017    Lab Results  Component Value Date   WBC 3.1 (L) 03/12/2017   NEUTROABS 1.6 03/12/2017   HGB 10.7 (L) 03/12/2017   HCT 31.9 (L) 03/12/2017   MCV 80.6 03/12/2017   PLT 252 03/12/2017     STUDIES: No results found.  ASSESSMENT: CLL, Rai stage 1  PLAN:    1.  CLL: Peripheral blood flow cytometry confirmed the results. Patient completed her second round of 4 cycles of weekly Rituxan on Jan 16, 2016. CT scan results reviewed independently with significant progression of disease with lymphadenopathy. Patient could not tolerate Imbruvica. She tolerated cycle 1 of Treanda and Rituxan last week without significant side effects. Return to clinic weekly for laboratory work and then in 3 weeks for consideration of cycle 2. Plan to do 3-4 cycles then reimage.  2. Nausea: Continue antiemetics as needed. 3. Leukopenia: Secondary to treatment, monitor.   Patient expressed understanding and was in agreement with this plan. She also understands that She can call clinic at any time with any questions, concerns, or complaints.    Lloyd Huger, MD   03/14/2017 7:47 AM

## 2017-03-12 ENCOUNTER — Inpatient Hospital Stay (HOSPITAL_BASED_OUTPATIENT_CLINIC_OR_DEPARTMENT_OTHER): Payer: 59 | Admitting: Oncology

## 2017-03-12 ENCOUNTER — Inpatient Hospital Stay: Payer: 59

## 2017-03-12 VITALS — BP 112/70 | HR 73 | Temp 97.6°F | Resp 20 | Wt 144.1 lb

## 2017-03-12 DIAGNOSIS — Z800 Family history of malignant neoplasm of digestive organs: Secondary | ICD-10-CM | POA: Diagnosis not present

## 2017-03-12 DIAGNOSIS — E785 Hyperlipidemia, unspecified: Secondary | ICD-10-CM

## 2017-03-12 DIAGNOSIS — T451X5S Adverse effect of antineoplastic and immunosuppressive drugs, sequela: Secondary | ICD-10-CM

## 2017-03-12 DIAGNOSIS — R5381 Other malaise: Secondary | ICD-10-CM | POA: Diagnosis not present

## 2017-03-12 DIAGNOSIS — R110 Nausea: Secondary | ICD-10-CM | POA: Diagnosis not present

## 2017-03-12 DIAGNOSIS — R5383 Other fatigue: Secondary | ICD-10-CM | POA: Diagnosis not present

## 2017-03-12 DIAGNOSIS — D701 Agranulocytosis secondary to cancer chemotherapy: Secondary | ICD-10-CM | POA: Diagnosis not present

## 2017-03-12 DIAGNOSIS — R531 Weakness: Secondary | ICD-10-CM | POA: Diagnosis not present

## 2017-03-12 DIAGNOSIS — C9110 Chronic lymphocytic leukemia of B-cell type not having achieved remission: Secondary | ICD-10-CM | POA: Diagnosis not present

## 2017-03-12 DIAGNOSIS — Z79899 Other long term (current) drug therapy: Secondary | ICD-10-CM | POA: Diagnosis not present

## 2017-03-12 DIAGNOSIS — Z5111 Encounter for antineoplastic chemotherapy: Secondary | ICD-10-CM | POA: Diagnosis not present

## 2017-03-12 LAB — COMPREHENSIVE METABOLIC PANEL
ALT: 14 U/L (ref 14–54)
AST: 17 U/L (ref 15–41)
Albumin: 3.9 g/dL (ref 3.5–5.0)
Alkaline Phosphatase: 41 U/L (ref 38–126)
Anion gap: 4 — ABNORMAL LOW (ref 5–15)
BUN: 13 mg/dL (ref 6–20)
CHLORIDE: 103 mmol/L (ref 101–111)
CO2: 28 mmol/L (ref 22–32)
CREATININE: 0.79 mg/dL (ref 0.44–1.00)
Calcium: 8.8 mg/dL — ABNORMAL LOW (ref 8.9–10.3)
GFR calc non Af Amer: >60 mL/min (ref >60)
Glucose, Bld: 97 mg/dL (ref 65–99)
POTASSIUM: 3.5 mmol/L (ref 3.5–5.1)
SODIUM: 135 mmol/L (ref 135–145)
Total Bilirubin: 0.5 mg/dL (ref 0.3–1.2)
Total Protein: 6.1 g/dL — ABNORMAL LOW (ref 6.5–8.1)

## 2017-03-12 LAB — CBC WITH DIFFERENTIAL/PLATELET
BASOS ABS: 0 10*3/uL (ref 0–0.1)
Basophils Relative: 1 %
EOS ABS: 0.1 10*3/uL (ref 0–0.7)
EOS PCT: 2 %
HCT: 31.9 % — ABNORMAL LOW (ref 35.0–47.0)
Hemoglobin: 10.7 g/dL — ABNORMAL LOW (ref 12.0–16.0)
Lymphocytes Relative: 32 %
Lymphs Abs: 1 10*3/uL (ref 1.0–3.6)
MCH: 27 pg (ref 26.0–34.0)
MCHC: 33.6 g/dL (ref 32.0–36.0)
MCV: 80.6 fL (ref 80.0–100.0)
Monocytes Absolute: 0.5 10*3/uL (ref 0.2–0.9)
Monocytes Relative: 14 %
NEUTROS PCT: 51 %
Neutro Abs: 1.6 10*3/uL (ref 1.4–6.5)
PLATELETS: 252 10*3/uL (ref 150–440)
RBC: 3.96 MIL/uL (ref 3.80–5.20)
RDW: 17.1 % — ABNORMAL HIGH (ref 11.5–14.5)
WBC: 3.1 10*3/uL — ABNORMAL LOW (ref 3.6–11.0)

## 2017-03-12 NOTE — Progress Notes (Signed)
Patient denies concerns today, states treatment went well.

## 2017-03-19 ENCOUNTER — Inpatient Hospital Stay: Payer: 59

## 2017-03-19 ENCOUNTER — Inpatient Hospital Stay: Payer: 59 | Attending: Oncology

## 2017-03-19 DIAGNOSIS — C9110 Chronic lymphocytic leukemia of B-cell type not having achieved remission: Secondary | ICD-10-CM | POA: Insufficient documentation

## 2017-03-19 LAB — CBC WITH DIFFERENTIAL/PLATELET
Basophils Absolute: 0.1 10*3/uL (ref 0–0.1)
Basophils Relative: 3 %
Eosinophils Absolute: 0.1 10*3/uL (ref 0–0.7)
Eosinophils Relative: 3 %
HEMATOCRIT: 35.3 % (ref 35.0–47.0)
HEMOGLOBIN: 11.8 g/dL — AB (ref 12.0–16.0)
LYMPHS ABS: 1.2 10*3/uL (ref 1.0–3.6)
LYMPHS PCT: 31 %
MCH: 27.4 pg (ref 26.0–34.0)
MCHC: 33.3 g/dL (ref 32.0–36.0)
MCV: 82.1 fL (ref 80.0–100.0)
MONOS PCT: 11 %
Monocytes Absolute: 0.4 10*3/uL (ref 0.2–0.9)
NEUTROS ABS: 2.2 10*3/uL (ref 1.4–6.5)
NEUTROS PCT: 54 %
Platelets: 267 10*3/uL (ref 150–440)
RBC: 4.3 MIL/uL (ref 3.80–5.20)
RDW: 17.7 % — ABNORMAL HIGH (ref 11.5–14.5)
WBC: 4 10*3/uL (ref 3.6–11.0)

## 2017-03-19 LAB — COMPREHENSIVE METABOLIC PANEL
ALK PHOS: 48 U/L (ref 38–126)
ALT: 13 U/L — AB (ref 14–54)
AST: 17 U/L (ref 15–41)
Albumin: 4.3 g/dL (ref 3.5–5.0)
Anion gap: 6 (ref 5–15)
BILIRUBIN TOTAL: 0.7 mg/dL (ref 0.3–1.2)
BUN: 18 mg/dL (ref 6–20)
CALCIUM: 8.9 mg/dL (ref 8.9–10.3)
CO2: 28 mmol/L (ref 22–32)
CREATININE: 0.83 mg/dL (ref 0.44–1.00)
Chloride: 102 mmol/L (ref 101–111)
Glucose, Bld: 91 mg/dL (ref 65–99)
Potassium: 3.5 mmol/L (ref 3.5–5.1)
SODIUM: 136 mmol/L (ref 135–145)
TOTAL PROTEIN: 6.8 g/dL (ref 6.5–8.1)

## 2017-03-24 ENCOUNTER — Other Ambulatory Visit: Payer: Self-pay | Admitting: Oncology

## 2017-03-26 ENCOUNTER — Inpatient Hospital Stay: Payer: 59

## 2017-03-26 DIAGNOSIS — C9110 Chronic lymphocytic leukemia of B-cell type not having achieved remission: Secondary | ICD-10-CM

## 2017-03-26 LAB — CBC WITH DIFFERENTIAL/PLATELET
Basophils Absolute: 0.1 10*3/uL (ref 0–0.1)
Basophils Relative: 2 %
EOS PCT: 1 %
Eosinophils Absolute: 0.1 10*3/uL (ref 0–0.7)
HCT: 36 % (ref 35.0–47.0)
Hemoglobin: 11.7 g/dL — ABNORMAL LOW (ref 12.0–16.0)
LYMPHS ABS: 2.2 10*3/uL (ref 1.0–3.6)
LYMPHS PCT: 44 %
MCH: 26.7 pg (ref 26.0–34.0)
MCHC: 32.4 g/dL (ref 32.0–36.0)
MCV: 82.3 fL (ref 80.0–100.0)
MONO ABS: 0.5 10*3/uL (ref 0.2–0.9)
Monocytes Relative: 10 %
Neutro Abs: 2.1 10*3/uL (ref 1.4–6.5)
Neutrophils Relative %: 43 %
PLATELETS: 263 10*3/uL (ref 150–440)
RBC: 4.37 MIL/uL (ref 3.80–5.20)
RDW: 16.8 % — AB (ref 11.5–14.5)
WBC: 4.9 10*3/uL (ref 3.6–11.0)

## 2017-03-26 LAB — COMPREHENSIVE METABOLIC PANEL
ALT: 13 U/L — AB (ref 14–54)
AST: 17 U/L (ref 15–41)
Albumin: 4.5 g/dL (ref 3.5–5.0)
Alkaline Phosphatase: 50 U/L (ref 38–126)
Anion gap: 7 (ref 5–15)
BILIRUBIN TOTAL: 0.8 mg/dL (ref 0.3–1.2)
BUN: 16 mg/dL (ref 6–20)
CHLORIDE: 106 mmol/L (ref 101–111)
CO2: 26 mmol/L (ref 22–32)
CREATININE: 0.85 mg/dL (ref 0.44–1.00)
Calcium: 8.9 mg/dL (ref 8.9–10.3)
Glucose, Bld: 100 mg/dL — ABNORMAL HIGH (ref 65–99)
Potassium: 3.9 mmol/L (ref 3.5–5.1)
Sodium: 139 mmol/L (ref 135–145)
TOTAL PROTEIN: 6.8 g/dL (ref 6.5–8.1)

## 2017-03-29 NOTE — Progress Notes (Signed)
[place]  Telephone:(336) [phone] Fax:(336) [phone]  ID: Cathy [name] OB: 03/25/1956  MR#: [id number]  XAJ#:[id number]  Patient Care Team: Wardell Honour, MD as PCP - General (Family Medicine)  CHIEF COMPLAINT:  CLL, Rai stage 1  INTERVAL HISTORY: Patient returns to clinic today for repeat laboratory work and to assess her toleration of cycle 2 of Rituxan and Treanda. She continues to have fatigue which is unchanged but feels she is tolerating treatment well. She endorses mild nausea and has not tried otc medications or compazine. She does not like to take zofran d/t constipation. She says that lymph nodes have reduced in size significantly and hopes to not take all 4 cycles. She continues to work full time, denies fever, night sweats, weight loss, neurologic complaints. She denies chest pain and denies any shortness of breath or cough, diarrhea, melena or hematochezia. Denies urinary and other complaints.   REVIEW OF SYSTEMS:   Review of Systems  Constitutional: Positive for malaise/fatigue. Negative for diaphoresis, fever and weight loss.  Respiratory: Negative.  Negative for cough and shortness of breath.   Cardiovascular: Negative for chest pain and leg swelling.  Gastrointestinal: Positive for nausea. Negative for abdominal pain and constipation.  Genitourinary: Negative.   Musculoskeletal: Negative.   Neurological: Positive for weakness. Negative for sensory change and headaches.  Psychiatric/Behavioral: Negative.  The patient is not nervous/anxious.     As per HPI. Otherwise, a complete review of systems is negative.  PAST MEDICAL HISTORY: Past Medical History:  Diagnosis Date  . Chest pain   . CLL (chronic lymphocytic leukemia) ([place])   . CLL (chronic lymphocytic leukemia) ([place])   . CLL (chronic lymphocytic leukemia) ([place])   . Complication of anesthesia    woke up with severe cramps in extremities (26 years ago)  . Hyperlipidemia     PAST  SURGICAL HISTORY: Past Surgical History:  Procedure Laterality Date  . HYSTEROSCOPY W/D&C N/A 06/02/2016   Procedure: DILATATION AND CURETTAGE /HYSTEROSCOPY;  Surgeon: Brayton Mars, MD;  Location: [facility] ORS;  Service: Gynecology;  Laterality: N/A;  . TUBAL LIGATION      FAMILY HISTORY Family History  Problem Relation Age of Onset  . Stroke Mother 47       CVA  . Diabetes Mother   . Deep vein thrombosis Brother 45       recurrent DVTs.  . Stroke Maternal Grandmother   . Colon cancer Maternal Grandmother   . Stroke Maternal Grandfather   . Diabetes Maternal Grandfather   . Diabetes Sister   . Breast cancer Neg Hx   . Ovarian cancer Neg Hx        ADVANCED DIRECTIVES: No advanced directives on file   HEALTH MAINTENANCE: Social History  Substance Use Topics  . Smoking status: Never Smoker  . Smokeless tobacco: Never Used  . Alcohol use No     No Known Allergies  Current Outpatient Prescriptions  Medication Sig Dispense Refill  . acyclovir (ZOVIRAX) 400 MG tablet Take 1 tablet (400 mg total) by mouth daily. 30 tablet 3  . ondansetron (ZOFRAN) 8 MG tablet Take 1 tablet (8 mg total) by mouth 2 (two) times daily as needed for refractory nausea / vomiting. Start on day 2 after bendamustine chemotherapy. 30 tablet 1  . prochlorperazine (COMPAZINE) 10 MG tablet Take 1 tablet (10 mg total) by mouth every 6 (six) hours as needed (Nausea or vomiting). 30 tablet 1   No current facility-administered medications for this visit.  OBJECTIVE: Vitals:   03/30/17 0849  BP: 117/74  Pulse: 65  Resp: 20  Temp: (!) 96.6 F (35.9 C)     Body mass index is 23.71 kg/m.    ECOG FS:0 - Asymptomatic  General: Well-developed, well-nourished, no acute distress. Eyes: anicteric sclera. HEENT: Cervical lymphadenopathy resolved Lungs: Clear to auscultation bilaterally. Heart: Regular rate and rhythm. No rubs, murmurs, or gallops. Abdomen: Soft, nontender, nondistended. No  organomegaly noted, normoactive bowel sounds.  Musculoskeletal: No edema, cyanosis, or clubbing. Neuro: Alert, answering all questions appropriately. Cranial nerves grossly intact. Skin: No rashes or petechiae noted. Psych: Normal affect.    LAB RESULTS:  Lab Results  Component Value Date   NA 138 03/30/2017   K 3.9 03/30/2017   CL 106 03/30/2017   CO2 26 03/30/2017   GLUCOSE 97 03/30/2017   BUN 19 03/30/2017   CREATININE 0.89 03/30/2017   CALCIUM 8.4 (L) 03/30/2017   PROT 6.2 (L) 03/30/2017   ALBUMIN 3.8 03/30/2017   AST 14 (L) 03/30/2017   ALT 10 (L) 03/30/2017   ALKPHOS 44 03/30/2017   BILITOT 0.6 03/30/2017   GFRNONAA >60 03/30/2017   GFRAA >60 03/30/2017    Lab Results  Component Value Date   WBC 3.5 (L) 03/30/2017   NEUTROABS 1.6 03/30/2017   HGB 11.1 (L) 03/30/2017   HCT 33.0 (L) 03/30/2017   MCV 82.2 03/30/2017   PLT 205 03/30/2017     STUDIES: No results found.  ASSESSMENT: CLL, Rai stage 1  PLAN:    1.  CLL: Peripheral blood flow cytometry confirmed the results. Patient completed her second round of 4 cycles of weekly Rituxan on Jan 16, 2016. CT scan results reviewed independently with significant progression of disease with lymphadenopathy. Patient could not tolerate Imbruvica. Proceed with cycle 2 of Treanda and Rituxan today. Treanda only tomorrow. Return to clinic in 4 weeks for labs, and consideration for cycle 3. Will plan to do 3-4 total cycles then reimage.  2. Nausea: Continue antiemetics as needed. 3. Leukopenia: Secondary to treatment, monitor.   Patient expressed understanding and was in agreement with this plan. She also understands that She can call clinic at any time with any questions, concerns, or complaints.    Verlon Au NP   03/30/17  Patient was seen and evaluated independently and I agree with the assessment and plan as dictated above.  Lloyd Huger, MD 03/30/17 2:36 PM

## 2017-03-30 ENCOUNTER — Inpatient Hospital Stay (HOSPITAL_BASED_OUTPATIENT_CLINIC_OR_DEPARTMENT_OTHER): Payer: 59 | Admitting: Oncology

## 2017-03-30 ENCOUNTER — Inpatient Hospital Stay: Payer: 59

## 2017-03-30 ENCOUNTER — Inpatient Hospital Stay: Payer: 59 | Attending: Oncology

## 2017-03-30 VITALS — BP 117/74 | HR 65 | Temp 96.6°F | Resp 20 | Wt 145.8 lb

## 2017-03-30 DIAGNOSIS — R110 Nausea: Secondary | ICD-10-CM | POA: Diagnosis not present

## 2017-03-30 DIAGNOSIS — T451X5S Adverse effect of antineoplastic and immunosuppressive drugs, sequela: Secondary | ICD-10-CM | POA: Diagnosis not present

## 2017-03-30 DIAGNOSIS — C9110 Chronic lymphocytic leukemia of B-cell type not having achieved remission: Secondary | ICD-10-CM

## 2017-03-30 DIAGNOSIS — R5381 Other malaise: Secondary | ICD-10-CM | POA: Diagnosis not present

## 2017-03-30 DIAGNOSIS — D72818 Other decreased white blood cell count: Secondary | ICD-10-CM | POA: Insufficient documentation

## 2017-03-30 DIAGNOSIS — E785 Hyperlipidemia, unspecified: Secondary | ICD-10-CM

## 2017-03-30 DIAGNOSIS — Z5111 Encounter for antineoplastic chemotherapy: Secondary | ICD-10-CM | POA: Diagnosis present

## 2017-03-30 DIAGNOSIS — R5383 Other fatigue: Secondary | ICD-10-CM

## 2017-03-30 DIAGNOSIS — Z79899 Other long term (current) drug therapy: Secondary | ICD-10-CM

## 2017-03-30 LAB — COMPREHENSIVE METABOLIC PANEL
ALT: 10 U/L — ABNORMAL LOW (ref 14–54)
AST: 14 U/L — ABNORMAL LOW (ref 15–41)
Albumin: 3.8 g/dL (ref 3.5–5.0)
Alkaline Phosphatase: 44 U/L (ref 38–126)
Anion gap: 6 (ref 5–15)
BILIRUBIN TOTAL: 0.6 mg/dL (ref 0.3–1.2)
BUN: 19 mg/dL (ref 6–20)
CHLORIDE: 106 mmol/L (ref 101–111)
CO2: 26 mmol/L (ref 22–32)
Calcium: 8.4 mg/dL — ABNORMAL LOW (ref 8.9–10.3)
Creatinine, Ser: 0.89 mg/dL (ref 0.44–1.00)
GFR calc non Af Amer: >60 mL/min (ref >60)
Glucose, Bld: 97 mg/dL (ref 65–99)
POTASSIUM: 3.9 mmol/L (ref 3.5–5.1)
Sodium: 138 mmol/L (ref 135–145)
Total Protein: 6.2 g/dL — ABNORMAL LOW (ref 6.5–8.1)

## 2017-03-30 LAB — CBC WITH DIFFERENTIAL/PLATELET
BASOS ABS: 0.1 10*3/uL (ref 0–0.1)
Basophils Relative: 2 %
EOS PCT: 3 %
Eosinophils Absolute: 0.1 10*3/uL (ref 0–0.7)
HEMATOCRIT: 33 % — AB (ref 35.0–47.0)
Hemoglobin: 11.1 g/dL — ABNORMAL LOW (ref 12.0–16.0)
LYMPHS ABS: 1.4 10*3/uL (ref 1.0–3.6)
LYMPHS PCT: 39 %
MCH: 27.6 pg (ref 26.0–34.0)
MCHC: 33.6 g/dL (ref 32.0–36.0)
MCV: 82.2 fL (ref 80.0–100.0)
MONO ABS: 0.4 10*3/uL (ref 0.2–0.9)
MONOS PCT: 10 %
Neutro Abs: 1.6 10*3/uL (ref 1.4–6.5)
Neutrophils Relative %: 46 %
PLATELETS: 205 10*3/uL (ref 150–440)
RBC: 4.02 MIL/uL (ref 3.80–5.20)
RDW: 16.9 % — AB (ref 11.5–14.5)
WBC: 3.5 10*3/uL — ABNORMAL LOW (ref 3.6–11.0)

## 2017-03-30 MED ORDER — PALONOSETRON HCL INJECTION 0.25 MG/5ML
0.2500 mg | Freq: Once | INTRAVENOUS | Status: AC
  Administered 2017-03-30: 0.25 mg via INTRAVENOUS

## 2017-03-30 MED ORDER — RITUXIMAB CHEMO INJECTION 500 MG/50ML
375.0000 mg/m2 | Freq: Once | INTRAVENOUS | Status: AC
Start: 2017-03-30 — End: 2017-03-30
  Administered 2017-03-30: 700 mg via INTRAVENOUS
  Filled 2017-03-30: qty 50

## 2017-03-30 MED ORDER — SODIUM CHLORIDE 0.9 % IV SOLN: 10.0000 mg | Freq: Once | INTRAVENOUS | Status: DC

## 2017-03-30 MED ORDER — SODIUM CHLORIDE 0.9 % IV SOLN
Freq: Once | INTRAVENOUS | Status: AC
  Administered 2017-03-30: 10:00:00 via INTRAVENOUS
  Filled 2017-03-30: qty 1000

## 2017-03-30 MED ORDER — DEXAMETHASONE SODIUM PHOSPHATE 10 MG/ML IJ SOLN
10.0000 mg | Freq: Once | INTRAMUSCULAR | Status: AC
  Administered 2017-03-30: 10 mg via INTRAVENOUS
  Filled 2017-03-30: qty 1

## 2017-03-30 MED ORDER — ACETAMINOPHEN 325 MG PO TABS
650.0000 mg | ORAL_TABLET | Freq: Once | ORAL | Status: AC
  Administered 2017-03-30: 650 mg via ORAL
  Filled 2017-03-30: qty 2

## 2017-03-30 MED ORDER — SODIUM CHLORIDE 0.9 % IV SOLN: 375.0000 mg/m2 | Freq: Once | INTRAVENOUS | Status: DC

## 2017-03-30 MED ORDER — DIPHENHYDRAMINE HCL 25 MG PO CAPS
25.0000 mg | ORAL_CAPSULE | Freq: Once | ORAL | Status: AC
  Administered 2017-03-30: 25 mg via ORAL
  Filled 2017-03-30: qty 1

## 2017-03-30 MED ORDER — SODIUM CHLORIDE 0.9 % IV SOLN
90.0000 mg/m2 | Freq: Once | INTRAVENOUS | Status: AC
  Administered 2017-03-30: 150 mg via INTRAVENOUS
  Filled 2017-03-30: qty 6

## 2017-03-30 NOTE — Progress Notes (Signed)
Patient denies any concerns today.  

## 2017-03-31 ENCOUNTER — Inpatient Hospital Stay: Payer: 59

## 2017-03-31 VITALS — BP 100/60 | HR 79 | Temp 97.7°F | Resp 18

## 2017-03-31 DIAGNOSIS — C9110 Chronic lymphocytic leukemia of B-cell type not having achieved remission: Secondary | ICD-10-CM

## 2017-03-31 DIAGNOSIS — Z5111 Encounter for antineoplastic chemotherapy: Secondary | ICD-10-CM | POA: Diagnosis not present

## 2017-03-31 MED ORDER — DEXAMETHASONE SODIUM PHOSPHATE 10 MG/ML IJ SOLN
10.0000 mg | Freq: Once | INTRAMUSCULAR | Status: AC
  Administered 2017-03-31: 10 mg via INTRAVENOUS
  Filled 2017-03-31: qty 1

## 2017-03-31 MED ORDER — BENDAMUSTINE HCL CHEMO INJECTION 100 MG/4ML
90.0000 mg/m2 | Freq: Once | INTRAVENOUS | Status: AC
  Administered 2017-03-31: 150 mg via INTRAVENOUS
  Filled 2017-03-31: qty 6

## 2017-03-31 MED ORDER — SODIUM CHLORIDE 0.9 % IV SOLN
Freq: Once | INTRAVENOUS | Status: AC
  Administered 2017-03-31: 09:00:00 via INTRAVENOUS
  Filled 2017-03-31: qty 1000

## 2017-04-24 ENCOUNTER — Other Ambulatory Visit: Payer: Self-pay | Admitting: Oncology

## 2017-04-24 NOTE — Progress Notes (Signed)
[place]  Telephone:(336) [phone] Fax:(336) [phone]  ID: Riesa Pope OB: November 20, 1955  MR#: [id number]  EXN#:[id number]  Patient Care Team: Wardell Honour, MD as PCP - General (Family Medicine)  CHIEF COMPLAINT:  CLL, Rai stage 1  INTERVAL HISTORY: Patient returns to clinic today for further evaluation and consideration of cycle 3 of Rituxan and Treanda. She had increased nausea and fatigue for several weeks after her last treatment stating it affected her day-to-day life. His has resolved over the past week and she is now back to her baseline. Despite this, she continues to work full-time. She does not complain of any further nausea. She denies any fevers, night sweats, or weight loss. She has no neurologic complaints. She has no chest pain and denies any shortness of breath or cough.  She denies any vomiting, constipation, or diarrhea.  She has no melena or hematochezia.  She has no urinary complaints.  Patient offers no further specific complaints today.  REVIEW OF SYSTEMS:   Review of Systems  Constitutional: Positive for malaise/fatigue. Negative for diaphoresis, fever and weight loss.  Respiratory: Negative.  Negative for cough and shortness of breath.   Cardiovascular: Negative for chest pain and leg swelling.  Gastrointestinal: Negative for abdominal pain, constipation and nausea.  Genitourinary: Negative.   Musculoskeletal: Negative.   Neurological: Positive for weakness. Negative for sensory change and headaches.  Psychiatric/Behavioral: Negative.  The patient is not nervous/anxious.     As per HPI. Otherwise, a complete review of systems is negative.  PAST MEDICAL HISTORY: Past Medical History:  Diagnosis Date  . Chest pain   . CLL (chronic lymphocytic leukemia) ([place])   . CLL (chronic lymphocytic leukemia) ([place])   . CLL (chronic lymphocytic leukemia) ([place])   . Complication of anesthesia    woke up with severe cramps in extremities (26 years ago)   . Hyperlipidemia     PAST SURGICAL HISTORY: Past Surgical History:  Procedure Laterality Date  . HYSTEROSCOPY W/D&C N/A 06/02/2016   Procedure: DILATATION AND CURETTAGE /HYSTEROSCOPY;  Surgeon: Brayton Mars, MD;  Location: [facility] ORS;  Service: Gynecology;  Laterality: N/A;  . TUBAL LIGATION      FAMILY HISTORY Family History  Problem Relation Age of Onset  . Stroke Mother 38       CVA  . Diabetes Mother   . Deep vein thrombosis Brother 45       recurrent DVTs.  . Stroke Maternal Grandmother   . Colon cancer Maternal Grandmother   . Stroke Maternal Grandfather   . Diabetes Maternal Grandfather   . Diabetes Sister   . Breast cancer Neg Hx   . Ovarian cancer Neg Hx        ADVANCED DIRECTIVES:    HEALTH MAINTENANCE: Social History  Substance Use Topics  . Smoking status: Never Smoker  . Smokeless tobacco: Never Used  . Alcohol use No     No Known Allergies  Current Outpatient Prescriptions  Medication Sig Dispense Refill  . acyclovir (ZOVIRAX) 400 MG tablet Take 1 tablet (400 mg total) by mouth daily. 30 tablet 3  . ondansetron (ZOFRAN) 8 MG tablet Take 1 tablet (8 mg total) by mouth 2 (two) times daily as needed for refractory nausea / vomiting. Start on day 2 after bendamustine chemotherapy. 30 tablet 1  . prochlorperazine (COMPAZINE) 10 MG tablet Take 1 tablet (10 mg total) by mouth every 6 (six) hours as needed (Nausea or vomiting). 30 tablet 1   No current facility-administered  medications for this visit.    Facility-Administered Medications Ordered in Other Visits  Medication Dose Route Frequency Provider Last Rate Last Dose  . bendamustine (BENDEKA) 125 mg in sodium chloride 0.9 % 50 mL (2.2727 mg/mL) chemo infusion  70 mg/m2 (Treatment Plan Recorded) Intravenous Once Lloyd Huger, MD      . palonosetron (ALOXI) injection 0.25 mg  0.25 mg Intravenous Once Lloyd Huger, MD      . riTUXimab (RITUXAN) 700 mg in sodium chloride 0.9 % 180 mL  chemo infusion  375 mg/m2 Intravenous Once Lloyd Huger, MD        OBJECTIVE: Vitals:   04/27/17 0854  BP: 120/82  Pulse: 69  Resp: 20  Temp: (!) 96.8 F (36 C)     Body mass index is 23.34 kg/m.    ECOG FS:0 - Asymptomatic  General: Well-developed, well-nourished, no acute distress. Eyes: anicteric sclera. HEENT: Cervical lymphadenopathy resolved Lungs: Clear to auscultation bilaterally. Heart: Regular rate and rhythm. No rubs, murmurs, or gallops. Abdomen: Soft, nontender, nondistended. No organomegaly noted, normoactive bowel sounds. Musculoskeletal: No edema, cyanosis, or clubbing. Neuro: Alert, answering all questions appropriately. Cranial nerves grossly intact. Skin: No rashes or petechiae noted. Psych: Normal affect.    LAB RESULTS:  Lab Results  Component Value Date   NA 137 04/27/2017   K 3.8 04/27/2017   CL 106 04/27/2017   CO2 26 04/27/2017   GLUCOSE 92 04/27/2017   BUN 17 04/27/2017   CREATININE 0.93 04/27/2017   CALCIUM 8.9 04/27/2017   PROT 7.0 04/27/2017   ALBUMIN 4.2 04/27/2017   AST 18 04/27/2017   ALT 11 (L) 04/27/2017   ALKPHOS 50 04/27/2017   BILITOT 0.5 04/27/2017   GFRNONAA >60 04/27/2017   GFRAA >60 04/27/2017    Lab Results  Component Value Date   WBC 2.4 (L) 04/27/2017   NEUTROABS 1.4 04/27/2017   HGB 12.2 04/27/2017   HCT 35.2 04/27/2017   MCV 82.2 04/27/2017   PLT 230 04/27/2017     STUDIES: No results found.  ASSESSMENT: CLL, Rai stage 1  PLAN:    1.  CLL: Peripheral blood flow cytometry confirmed the results. Patient completed her second round of 4 cycles of weekly Rituxan on Jan 16, 2016. CT scan results from Dec 30, 2016 reviewed independently with significant progression of disease with lymphadenopathy. Patient could not tolerate Imbruvica. Proceed with cycle 3 of Treanda and Rituxan today. Will dose reduced Treanda secondary to persistent fatigue and nausea. Return to clinic tomorrow for [place] only. Patient  will then return to clinic in 4 weeks with repeat imaging and further evaluation.   2. Nausea: Continue antiemetics as needed. 3. Leukopenia: Secondary to treatment, monitor.   Patient expressed understanding and was in agreement with this plan. She also understands that She can call clinic at any time with any questions, concerns, or complaints.    Lloyd Huger, MD   04/27/2017 9:49 AM

## 2017-04-27 ENCOUNTER — Inpatient Hospital Stay: Payer: 59

## 2017-04-27 ENCOUNTER — Inpatient Hospital Stay: Payer: 59 | Attending: Oncology | Admitting: Oncology

## 2017-04-27 VITALS — BP 120/82 | HR 69 | Temp 96.8°F | Resp 20 | Wt 143.5 lb

## 2017-04-27 DIAGNOSIS — C9110 Chronic lymphocytic leukemia of B-cell type not having achieved remission: Secondary | ICD-10-CM | POA: Diagnosis not present

## 2017-04-27 DIAGNOSIS — Z5111 Encounter for antineoplastic chemotherapy: Secondary | ICD-10-CM | POA: Insufficient documentation

## 2017-04-27 DIAGNOSIS — Z800 Family history of malignant neoplasm of digestive organs: Secondary | ICD-10-CM | POA: Insufficient documentation

## 2017-04-27 DIAGNOSIS — D701 Agranulocytosis secondary to cancer chemotherapy: Secondary | ICD-10-CM | POA: Insufficient documentation

## 2017-04-27 DIAGNOSIS — R5381 Other malaise: Secondary | ICD-10-CM | POA: Diagnosis not present

## 2017-04-27 DIAGNOSIS — E785 Hyperlipidemia, unspecified: Secondary | ICD-10-CM | POA: Diagnosis not present

## 2017-04-27 DIAGNOSIS — Z79899 Other long term (current) drug therapy: Secondary | ICD-10-CM | POA: Diagnosis not present

## 2017-04-27 DIAGNOSIS — T451X5S Adverse effect of antineoplastic and immunosuppressive drugs, sequela: Secondary | ICD-10-CM | POA: Diagnosis not present

## 2017-04-27 DIAGNOSIS — R531 Weakness: Secondary | ICD-10-CM | POA: Insufficient documentation

## 2017-04-27 DIAGNOSIS — R5383 Other fatigue: Secondary | ICD-10-CM | POA: Diagnosis not present

## 2017-04-27 DIAGNOSIS — R110 Nausea: Secondary | ICD-10-CM | POA: Insufficient documentation

## 2017-04-27 DIAGNOSIS — C9190 Lymphoid leukemia, unspecified not having achieved remission: Secondary | ICD-10-CM | POA: Diagnosis not present

## 2017-04-27 LAB — COMPREHENSIVE METABOLIC PANEL
ALK PHOS: 50 U/L (ref 38–126)
ALT: 11 U/L — AB (ref 14–54)
AST: 18 U/L (ref 15–41)
Albumin: 4.2 g/dL (ref 3.5–5.0)
Anion gap: 5 (ref 5–15)
BUN: 17 mg/dL (ref 6–20)
CALCIUM: 8.9 mg/dL (ref 8.9–10.3)
CO2: 26 mmol/L (ref 22–32)
CREATININE: 0.93 mg/dL (ref 0.44–1.00)
Chloride: 106 mmol/L (ref 101–111)
GFR calc Af Amer: >60 mL/min (ref >60)
Glucose, Bld: 92 mg/dL (ref 65–99)
Potassium: 3.8 mmol/L (ref 3.5–5.1)
Sodium: 137 mmol/L (ref 135–145)
TOTAL PROTEIN: 7 g/dL (ref 6.5–8.1)
Total Bilirubin: 0.5 mg/dL (ref 0.3–1.2)

## 2017-04-27 LAB — CBC WITH DIFFERENTIAL/PLATELET
BASOS ABS: 0.1 10*3/uL (ref 0–0.1)
Basophils Relative: 2 %
Eosinophils Absolute: 0.1 10*3/uL (ref 0–0.7)
Eosinophils Relative: 3 %
HEMATOCRIT: 35.2 % (ref 35.0–47.0)
Hemoglobin: 12.2 g/dL (ref 12.0–16.0)
LYMPHS PCT: 21 %
Lymphs Abs: 0.5 10*3/uL — ABNORMAL LOW (ref 1.0–3.6)
MCH: 28.4 pg (ref 26.0–34.0)
MCHC: 34.6 g/dL (ref 32.0–36.0)
MCV: 82.2 fL (ref 80.0–100.0)
Monocytes Absolute: 0.4 10*3/uL (ref 0.2–0.9)
Monocytes Relative: 16 %
NEUTROS ABS: 1.4 10*3/uL (ref 1.4–6.5)
Neutrophils Relative %: 58 %
Platelets: 230 10*3/uL (ref 150–440)
RBC: 4.28 MIL/uL (ref 3.80–5.20)
RDW: 16 % — ABNORMAL HIGH (ref 11.5–14.5)
WBC: 2.4 10*3/uL — AB (ref 3.6–11.0)

## 2017-04-27 MED ORDER — ACETAMINOPHEN 325 MG PO TABS
650.0000 mg | ORAL_TABLET | Freq: Once | ORAL | Status: AC
  Administered 2017-04-27: 650 mg via ORAL
  Filled 2017-04-27: qty 2

## 2017-04-27 MED ORDER — SODIUM CHLORIDE 0.9 % IV SOLN
70.0000 mg/m2 | Freq: Once | INTRAVENOUS | Status: AC
  Administered 2017-04-27: 125 mg via INTRAVENOUS
  Filled 2017-04-27: qty 5

## 2017-04-27 MED ORDER — SODIUM CHLORIDE 0.9 % IV SOLN
Freq: Once | INTRAVENOUS | Status: AC
  Administered 2017-04-27: 09:00:00 via INTRAVENOUS
  Filled 2017-04-27: qty 1000

## 2017-04-27 MED ORDER — SODIUM CHLORIDE 0.9 % IV SOLN
375.0000 mg/m2 | Freq: Once | INTRAVENOUS | Status: AC
  Administered 2017-04-27: 700 mg via INTRAVENOUS
  Filled 2017-04-27: qty 50

## 2017-04-27 MED ORDER — DIPHENHYDRAMINE HCL 25 MG PO CAPS
25.0000 mg | ORAL_CAPSULE | Freq: Once | ORAL | Status: AC
  Administered 2017-04-27: 25 mg via ORAL
  Filled 2017-04-27: qty 1

## 2017-04-27 MED ORDER — DEXAMETHASONE SODIUM PHOSPHATE 10 MG/ML IJ SOLN
10.0000 mg | Freq: Once | INTRAMUSCULAR | Status: AC
  Administered 2017-04-27: 10 mg via INTRAVENOUS
  Filled 2017-04-27: qty 1

## 2017-04-27 MED ORDER — SODIUM CHLORIDE 0.9 % IV SOLN: 375.0000 mg/m2 | Freq: Once | INTRAVENOUS | Status: DC

## 2017-04-27 MED ORDER — SODIUM CHLORIDE 0.9 % IV SOLN: 10.0000 mg | Freq: Once | INTRAVENOUS | Status: DC

## 2017-04-27 MED ORDER — PALONOSETRON HCL INJECTION 0.25 MG/5ML
0.2500 mg | Freq: Once | INTRAVENOUS | Status: AC
  Administered 2017-04-27: 0.25 mg via INTRAVENOUS

## 2017-04-27 NOTE — Progress Notes (Signed)
Patient reports increased fatigue and nausea with last treatment.

## 2017-04-28 ENCOUNTER — Inpatient Hospital Stay: Payer: 59

## 2017-04-28 VITALS — BP 97/60 | HR 66 | Temp 97.0°F | Resp 18

## 2017-04-28 DIAGNOSIS — C9110 Chronic lymphocytic leukemia of B-cell type not having achieved remission: Secondary | ICD-10-CM

## 2017-04-28 DIAGNOSIS — Z5111 Encounter for antineoplastic chemotherapy: Secondary | ICD-10-CM | POA: Diagnosis not present

## 2017-04-28 MED ORDER — SODIUM CHLORIDE 0.9 % IV SOLN
70.0000 mg/m2 | Freq: Once | INTRAVENOUS | Status: AC
  Administered 2017-04-28: 125 mg via INTRAVENOUS
  Filled 2017-04-28: qty 5

## 2017-04-28 MED ORDER — DEXAMETHASONE SODIUM PHOSPHATE 10 MG/ML IJ SOLN
10.0000 mg | Freq: Once | INTRAMUSCULAR | Status: AC
  Administered 2017-04-28: 10 mg via INTRAVENOUS
  Filled 2017-04-28: qty 1

## 2017-04-28 MED ORDER — DEXAMETHASONE SODIUM PHOSPHATE 100 MG/10ML IJ SOLN: 10.0000 mg | Freq: Once | INTRAMUSCULAR | Status: DC

## 2017-04-28 MED ORDER — SODIUM CHLORIDE 0.9 % IV SOLN
Freq: Once | INTRAVENOUS | Status: AC
  Administered 2017-04-28: 10:00:00 via INTRAVENOUS
  Filled 2017-04-28: qty 1000

## 2017-05-22 ENCOUNTER — Ambulatory Visit
Admission: RE | Admit: 2017-05-22 | Discharge: 2017-05-22 | Disposition: A | Discharge status: Home / Self Care | Payer: 59 | Source: Ambulatory Visit | Attending: Oncology | Admitting: Oncology

## 2017-05-22 ENCOUNTER — Inpatient Hospital Stay: Payer: 59 | Attending: Oncology

## 2017-05-22 DIAGNOSIS — N281 Cyst of kidney, acquired: Secondary | ICD-10-CM | POA: Insufficient documentation

## 2017-05-22 DIAGNOSIS — C9110 Chronic lymphocytic leukemia of B-cell type not having achieved remission: Secondary | ICD-10-CM | POA: Diagnosis present

## 2017-05-22 DIAGNOSIS — E041 Nontoxic single thyroid nodule: Secondary | ICD-10-CM | POA: Insufficient documentation

## 2017-05-22 DIAGNOSIS — C9190 Lymphoid leukemia, unspecified not having achieved remission: Secondary | ICD-10-CM | POA: Insufficient documentation

## 2017-05-22 DIAGNOSIS — Z79899 Other long term (current) drug therapy: Secondary | ICD-10-CM | POA: Insufficient documentation

## 2017-05-22 DIAGNOSIS — D259 Leiomyoma of uterus, unspecified: Secondary | ICD-10-CM | POA: Insufficient documentation

## 2017-05-22 DIAGNOSIS — D72819 Decreased white blood cell count, unspecified: Secondary | ICD-10-CM | POA: Diagnosis not present

## 2017-05-22 DIAGNOSIS — E785 Hyperlipidemia, unspecified: Secondary | ICD-10-CM | POA: Insufficient documentation

## 2017-05-22 LAB — CBC WITH DIFFERENTIAL/PLATELET
BASOS ABS: 0 10*3/uL (ref 0–0.1)
BASOS PCT: 2 %
EOS PCT: 1 %
Eosinophils Absolute: 0 10*3/uL (ref 0–0.7)
HCT: 35.9 % (ref 35.0–47.0)
Hemoglobin: 12.3 g/dL (ref 12.0–16.0)
Lymphocytes Relative: 23 %
Lymphs Abs: 0.5 10*3/uL — ABNORMAL LOW (ref 1.0–3.6)
MCH: 28.2 pg (ref 26.0–34.0)
MCHC: 34.3 g/dL (ref 32.0–36.0)
MCV: 82 fL (ref 80.0–100.0)
MONO ABS: 0.3 10*3/uL (ref 0.2–0.9)
Monocytes Relative: 15 %
NEUTROS ABS: 1.2 10*3/uL — AB (ref 1.4–6.5)
Neutrophils Relative %: 59 %
PLATELETS: 233 10*3/uL (ref 150–440)
RBC: 4.38 MIL/uL (ref 3.80–5.20)
RDW: 15.3 % — AB (ref 11.5–14.5)
WBC: 2.1 10*3/uL — AB (ref 3.6–11.0)

## 2017-05-22 LAB — COMPREHENSIVE METABOLIC PANEL
ALBUMIN: 4.3 g/dL (ref 3.5–5.0)
ALT: 16 U/L (ref 14–54)
AST: 20 U/L (ref 15–41)
Alkaline Phosphatase: 52 U/L (ref 38–126)
Anion gap: 8 (ref 5–15)
BUN: 14 mg/dL (ref 6–20)
CHLORIDE: 103 mmol/L (ref 101–111)
CO2: 28 mmol/L (ref 22–32)
Calcium: 9.4 mg/dL (ref 8.9–10.3)
Creatinine, Ser: 0.88 mg/dL (ref 0.44–1.00)
GFR calc Af Amer: >60 mL/min (ref >60)
GFR calc non Af Amer: >60 mL/min (ref >60)
GLUCOSE: 99 mg/dL (ref 65–99)
POTASSIUM: 3.7 mmol/L (ref 3.5–5.1)
Sodium: 139 mmol/L (ref 135–145)
Total Bilirubin: 0.6 mg/dL (ref 0.3–1.2)
Total Protein: 7 g/dL (ref 6.5–8.1)

## 2017-05-22 MED ORDER — IOPAMIDOL (ISOVUE-300) INJECTION 61%
100.0000 mL | Freq: Once | INTRAVENOUS | Status: AC | PRN
  Administered 2017-05-22: 100 mL via INTRAVENOUS

## 2017-05-24 NOTE — Progress Notes (Signed)
[place]  [phone] [phone]  ID: Riesa Pope OB: Jun 29, 1956  MR#: [id number]  QIO#:[id number]  Patient Care Team: Wardell Honour, MD as PCP - General (Family Medicine)  CHIEF COMPLAINT:  CLL, Rai stage 1  INTERVAL HISTORY: Patient returns to clinic today for further evaluation and discussion of her imaging results. She currently feels well and is back to her baseline. She tolerated cycle 3 much better with dose reduced Treanda. She denies any weakness or fatigue and continues to work full-time. She denies any fevers, night sweats, or weight loss. She has no neurologic complaints. She has no chest pain and denies any shortness of breath or cough.  She denies any vomiting, constipation, or diarrhea.  She has no melena or hematochezia.  She has no urinary complaints.  Patient offers no specific complaints today.  REVIEW OF SYSTEMS:   Review of Systems  Constitutional: Negative for diaphoresis, fever, malaise/fatigue and weight loss.  Respiratory: Negative.  Negative for cough and shortness of breath.   Cardiovascular: Negative for chest pain and leg swelling.  Gastrointestinal: Negative for abdominal pain, constipation and nausea.  Genitourinary: Negative.   Musculoskeletal: Negative.   Skin: Negative.  Negative for rash.  Neurological: Negative for sensory change, weakness and headaches.  Psychiatric/Behavioral: Negative.  The patient is not nervous/anxious.     As per HPI. Otherwise, a complete review of systems is negative.  PAST MEDICAL HISTORY: Past Medical History:  Diagnosis Date  . Chest pain   . CLL (chronic lymphocytic leukemia) ([place])   . CLL (chronic lymphocytic leukemia) ([place])   . CLL (chronic lymphocytic leukemia) ([place])   . Complication of anesthesia    woke up with severe cramps in extremities (26 years ago)  . Hyperlipidemia     PAST SURGICAL HISTORY: Past Surgical History:  Procedure Laterality Date  .  HYSTEROSCOPY W/D&C N/A 06/02/2016   Procedure: DILATATION AND CURETTAGE /HYSTEROSCOPY;  Surgeon: Brayton Mars, MD;  Location: [facility] ORS;  Service: Gynecology;  Laterality: N/A;  . TUBAL LIGATION      FAMILY HISTORY Family History  Problem Relation Age of Onset  . Stroke Mother 41       CVA  . Diabetes Mother   . Deep vein thrombosis Brother 45       recurrent DVTs.  . Stroke Maternal Grandmother   . Colon cancer Maternal Grandmother   . Stroke Maternal Grandfather   . Diabetes Maternal Grandfather   . Diabetes Sister   . Breast cancer Neg Hx   . Ovarian cancer Neg Hx        ADVANCED DIRECTIVES:    HEALTH MAINTENANCE: Social History  Substance Use Topics  . Smoking status: Never Smoker  . Smokeless tobacco: Never Used  . Alcohol use No     No Known Allergies  Current Outpatient Prescriptions  Medication Sig Dispense Refill  . acyclovir (ZOVIRAX) 400 MG tablet Take 1 tablet (400 mg total) by mouth daily. 30 tablet 3  . ondansetron (ZOFRAN) 8 MG tablet Take 1 tablet (8 mg total) by mouth 2 (two) times daily as needed for refractory nausea / vomiting. Start on day 2 after bendamustine chemotherapy. 30 tablet 1  . prochlorperazine (COMPAZINE) 10 MG tablet Take 1 tablet (10 mg total) by mouth every 6 (six) hours as needed (Nausea or vomiting). 30 tablet 1   No current facility-administered medications for this visit.     OBJECTIVE: Vitals:   05/25/17 1126  BP: 120/73  Pulse:  74  Resp: 18  Temp: (!) 97.5 F (36.4 C)     Body mass index is 23.42 kg/m.    ECOG FS:0 - Asymptomatic  General: Well-developed, well-nourished, no acute distress. Eyes: anicteric sclera. HEENT: Cervical lymphadenopathy resolved Lungs: Clear to auscultation bilaterally. Heart: Regular rate and rhythm. No rubs, murmurs, or gallops. Abdomen: Soft, nontender, nondistended. No organomegaly noted, normoactive bowel sounds. Musculoskeletal: No edema, cyanosis, or clubbing. Neuro: Alert,  answering all questions appropriately. Cranial nerves grossly intact. Skin: No rashes or petechiae noted. Psych: Normal affect.    LAB RESULTS:  Lab Results  Component Value Date   NA 139 05/22/2017   K 3.7 05/22/2017   CL 103 05/22/2017   CO2 28 05/22/2017   GLUCOSE 99 05/22/2017   BUN 14 05/22/2017   CREATININE 0.88 05/22/2017   CALCIUM 9.4 05/22/2017   PROT 7.0 05/22/2017   ALBUMIN 4.3 05/22/2017   AST 20 05/22/2017   ALT 16 05/22/2017   ALKPHOS 52 05/22/2017   BILITOT 0.6 05/22/2017   GFRNONAA >60 05/22/2017   GFRAA >60 05/22/2017    Lab Results  Component Value Date   WBC 2.1 (L) 05/22/2017   NEUTROABS 1.2 (L) 05/22/2017   HGB 12.3 05/22/2017   HCT 35.9 05/22/2017   MCV 82.0 05/22/2017   PLT 233 05/22/2017     STUDIES: Ct Soft Tissue Neck W Contrast  Result Date: 05/22/2017 CLINICAL DATA:  Follow-up scan for chronic lymphocytic leukemia, nausea and fatigue from chemotherapy. EXAM: CT NECK WITH CONTRAST TECHNIQUE: Multidetector CT imaging of the neck was performed using the standard protocol following the bolus administration of intravenous contrast. CONTRAST:  161mL ISOVUE-300 IOPAMIDOL (ISOVUE-300) INJECTION 61% COMPARISON:  CT neck with contrast, 12/30/2016. FINDINGS: Pharynx and larynx: Normal. No mass or swelling. Salivary glands: No inflammation, mass, or stone. Thyroid: Redemonstrated is a chronic heterogeneous RIGHT thyroid lobe nodule, small foci of calcification, stable in size with a long axis of 22 mm. A chronic coarse calcification in the LEFT lobe is stable. Lymph nodes: Marked regression of lymphadenopathy at all stations indicating response to treatment. See for instance index lymph node LEFT level IB, series 3, image 48, now measures 4 mm short axis as compared with 13 mm previous. Vascular: Negative. Limited intracranial: Negative. Visualized orbits: Negative. Mastoids and visualized paranasal sinuses: Clear. Skeleton: No acute or aggressive process.  Upper chest: Reported separately. Other: None. IMPRESSION: Significant regression of adenopathy following treatment for CLL. See discussion above. Stable enlarged RIGHT thyroid nodule. Findings meet consensus criteria for biopsy. Ultrasound-guided fine needle aspiration should be considered, as per the consensus statement: Management of Thyroid Nodules Detected at [place]: Society of Radiologists in [place]. Radiology 2005; [id number]. Electronically Signed   By: Staci Righter M.D.   On: 05/22/2017 11:51   Ct Chest W Contrast  Result Date: 05/22/2017 CLINICAL DATA:  Restaging chronic lymphocytic leukemia. EXAM: CT CHEST, ABDOMEN, AND PELVIS WITH CONTRAST TECHNIQUE: Multidetector CT imaging of the chest, abdomen and pelvis was performed following the standard protocol during bolus administration of intravenous contrast. CONTRAST:  168mL ISOVUE-300 IOPAMIDOL (ISOVUE-300) INJECTION 61% COMPARISON:  12/30/2016 FINDINGS: CT CHEST FINDINGS Cardiovascular: The heart is normal in size. No pericardial effusion. Mild tortuosity of the thoracic aorta with age advanced atherosclerotic calcifications. No dissection. Age advanced three-vessel coronary artery calcifications. Mediastinum/Nodes: No mediastinal or hilar mass or lymphadenopathy. Resolution of adenopathy seen on the prior study. The esophagus is grossly normal. Lungs/Pleura: No acute pulmonary findings. Minimal dependent bibasilar atelectasis. A few  faint small subpleural nodules are likely lymph nodes. No worrisome pulmonary lesions. No pleural effusion. Musculoskeletal: The bulky axillary lymphadenopathy has resolved. Small residual scattered axillary lymph nodes. No supraclavicular adenopathy. Stable 2.3 cm right thyroid lesion. No change since 2016. Correlation and follow-up with thyroid ultrasound is suggested. CT ABDOMEN PELVIS FINDINGS Hepatobiliary: No focal hepatic lesions or intrahepatic biliary dilatation. The gallbladder is  normal. No common bile duct dilatation. Pancreas: No mass, inflammation or ductal dilatation. Spleen: Normal size.  No focal lesions. Adrenals/Urinary Tract: Adrenal glands and kidneys are unremarkable. Stable simple appearing left renal cyst. The bladder is normal. Stomach/Bowel: The stomach, duodenum, small bowel and colon are unremarkable. No acute inflammatory process, mass lesions or obstructive findings. Large amount of stool throughout the colon suggesting constipation. The terminal ileum normal. Vascular/Lymphatic: No mesenteric or retroperitoneal mass or adenopathy. Resolution of bulky periportal lymphadenopathy seen on the prior CT scan. Residual lymphoid tissue noted on image number 57 between the renal vein and portal vein measures 15.5 x 15 mm. Small residual scattered retroperitoneal lymph nodes but the bulky adenopathy is resolved. Resolution of bulky pelvic pain inguinal lymphadenopathy. Stable aortic calcifications but no aneurysm or dissection the branch vessels patent. The major venous structures are patent. Reproductive: Stable with enlarged fibroid uterus. Slightly prominent cervix appears relatively stable. The ovaries are normal. Other: No pelvic mass or adenopathy. No free pelvic fluid collections. No inguinal mass or adenopathy. No abdominal wall hernia or subcutaneous lesions. Musculoskeletal: The no significant bony findings. IMPRESSION: 1. Resolution of bulky adenopathy seen in the chest, abdomen and pelvis on the prior study, suggesting an excellent response to treatment. 2. No acute findings the chest abdomen or pelvis. 3. Stable left renal cyst. 4. Large amount of stool throughout the colon may suggest constipation. 5. Stable slightly enlarged fibroid uterus. Electronically Signed   By: Marijo Sanes M.D.   On: 05/22/2017 14:02   Ct Abdomen Pelvis W Contrast  Result Date: 05/22/2017 CLINICAL DATA:  Restaging chronic lymphocytic leukemia. EXAM: CT CHEST, ABDOMEN, AND PELVIS WITH  CONTRAST TECHNIQUE: Multidetector CT imaging of the chest, abdomen and pelvis was performed following the standard protocol during bolus administration of intravenous contrast. CONTRAST:  154mL ISOVUE-300 IOPAMIDOL (ISOVUE-300) INJECTION 61% COMPARISON:  12/30/2016 FINDINGS: CT CHEST FINDINGS Cardiovascular: The heart is normal in size. No pericardial effusion. Mild tortuosity of the thoracic aorta with age advanced atherosclerotic calcifications. No dissection. Age advanced three-vessel coronary artery calcifications. Mediastinum/Nodes: No mediastinal or hilar mass or lymphadenopathy. Resolution of adenopathy seen on the prior study. The esophagus is grossly normal. Lungs/Pleura: No acute pulmonary findings. Minimal dependent bibasilar atelectasis. A few faint small subpleural nodules are likely lymph nodes. No worrisome pulmonary lesions. No pleural effusion. Musculoskeletal: The bulky axillary lymphadenopathy has resolved. Small residual scattered axillary lymph nodes. No supraclavicular adenopathy. Stable 2.3 cm right thyroid lesion. No change since 2016. Correlation and follow-up with thyroid ultrasound is suggested. CT ABDOMEN PELVIS FINDINGS Hepatobiliary: No focal hepatic lesions or intrahepatic biliary dilatation. The gallbladder is normal. No common bile duct dilatation. Pancreas: No mass, inflammation or ductal dilatation. Spleen: Normal size.  No focal lesions. Adrenals/Urinary Tract: Adrenal glands and kidneys are unremarkable. Stable simple appearing left renal cyst. The bladder is normal. Stomach/Bowel: The stomach, duodenum, small bowel and colon are unremarkable. No acute inflammatory process, mass lesions or obstructive findings. Large amount of stool throughout the colon suggesting constipation. The terminal ileum normal. Vascular/Lymphatic: No mesenteric or retroperitoneal mass or adenopathy. Resolution of bulky periportal lymphadenopathy seen on  the prior CT scan. Residual lymphoid tissue noted  on image number 57 between the renal vein and portal vein measures 15.5 x 15 mm. Small residual scattered retroperitoneal lymph nodes but the bulky adenopathy is resolved. Resolution of bulky pelvic pain inguinal lymphadenopathy. Stable aortic calcifications but no aneurysm or dissection the branch vessels patent. The major venous structures are patent. Reproductive: Stable with enlarged fibroid uterus. Slightly prominent cervix appears relatively stable. The ovaries are normal. Other: No pelvic mass or adenopathy. No free pelvic fluid collections. No inguinal mass or adenopathy. No abdominal wall hernia or subcutaneous lesions. Musculoskeletal: The no significant bony findings. IMPRESSION: 1. Resolution of bulky adenopathy seen in the chest, abdomen and pelvis on the prior study, suggesting an excellent response to treatment. 2. No acute findings the chest abdomen or pelvis. 3. Stable left renal cyst. 4. Large amount of stool throughout the colon may suggest constipation. 5. Stable slightly enlarged fibroid uterus. Electronically Signed   By: Marijo Sanes M.D.   On: 05/22/2017 14:02    ASSESSMENT: CLL, Rai stage 1  PLAN:    1.  CLL: Peripheral blood flow cytometry confirmed the results. Patient completed her second round of 4 cycles of weekly Rituxan on Jan 16, 2016. Patient could not tolerate Imbruvica. She completed cycle 3 of dose reduced Treanda and Rituxan on April 28, 2017. CT results from May 22, 2017 reviewed independently and reported as above with excellent response to treatment. No further intervention is needed at this time. Return to clinic in 3 months with repeat imaging and further evaluation. 2. Nausea: Resolved. Continue antiemetics as needed. 3. Leukopenia: Secondary to treatment, monitor.  Approximately 20 minutes was spent in discussion of which 50% was consultation.   Patient expressed understanding and was in agreement with this plan. She also understands that She can  call clinic at any time with any questions, concerns, or complaints.    Lloyd Huger, MD   05/25/2017 1:22 PM

## 2017-05-25 ENCOUNTER — Inpatient Hospital Stay (HOSPITAL_BASED_OUTPATIENT_CLINIC_OR_DEPARTMENT_OTHER): Payer: 59 | Admitting: Oncology

## 2017-05-25 VITALS — BP 120/73 | HR 74 | Temp 97.5°F | Resp 18 | Wt 144.0 lb

## 2017-05-25 DIAGNOSIS — Z79899 Other long term (current) drug therapy: Secondary | ICD-10-CM | POA: Diagnosis not present

## 2017-05-25 DIAGNOSIS — D259 Leiomyoma of uterus, unspecified: Secondary | ICD-10-CM | POA: Diagnosis not present

## 2017-05-25 DIAGNOSIS — E785 Hyperlipidemia, unspecified: Secondary | ICD-10-CM | POA: Diagnosis not present

## 2017-05-25 DIAGNOSIS — D72819 Decreased white blood cell count, unspecified: Secondary | ICD-10-CM

## 2017-05-25 DIAGNOSIS — N281 Cyst of kidney, acquired: Secondary | ICD-10-CM

## 2017-05-25 DIAGNOSIS — C9110 Chronic lymphocytic leukemia of B-cell type not having achieved remission: Secondary | ICD-10-CM

## 2017-07-14 ENCOUNTER — Encounter: Payer: Self-pay | Admitting: Internal Medicine

## 2017-07-14 ENCOUNTER — Ambulatory Visit (INDEPENDENT_AMBULATORY_CARE_PROVIDER_SITE_OTHER): Payer: 59 | Admitting: Internal Medicine

## 2017-07-14 ENCOUNTER — Telehealth: Payer: Self-pay | Admitting: Internal Medicine

## 2017-07-14 ENCOUNTER — Telehealth: Payer: Self-pay

## 2017-07-14 VITALS — BP 126/70 | HR 65 | Temp 98.3°F | Ht 66.5 in | Wt 147.4 lb

## 2017-07-14 DIAGNOSIS — C9190 Lymphoid leukemia, unspecified not having achieved remission: Secondary | ICD-10-CM | POA: Diagnosis not present

## 2017-07-14 DIAGNOSIS — Z13818 Encounter for screening for other digestive system disorders: Secondary | ICD-10-CM

## 2017-07-14 DIAGNOSIS — C9110 Chronic lymphocytic leukemia of B-cell type not having achieved remission: Secondary | ICD-10-CM

## 2017-07-14 DIAGNOSIS — F341 Dysthymic disorder: Secondary | ICD-10-CM

## 2017-07-14 DIAGNOSIS — Z111 Encounter for screening for respiratory tuberculosis: Secondary | ICD-10-CM

## 2017-07-14 DIAGNOSIS — Z23 Encounter for immunization: Secondary | ICD-10-CM

## 2017-07-14 DIAGNOSIS — E041 Nontoxic single thyroid nodule: Secondary | ICD-10-CM

## 2017-07-14 DIAGNOSIS — Z1231 Encounter for screening mammogram for malignant neoplasm of breast: Secondary | ICD-10-CM

## 2017-07-14 DIAGNOSIS — H539 Unspecified visual disturbance: Secondary | ICD-10-CM | POA: Diagnosis not present

## 2017-07-14 DIAGNOSIS — E559 Vitamin D deficiency, unspecified: Secondary | ICD-10-CM | POA: Diagnosis not present

## 2017-07-14 DIAGNOSIS — D72819 Decreased white blood cell count, unspecified: Secondary | ICD-10-CM | POA: Diagnosis not present

## 2017-07-14 DIAGNOSIS — R87810 Cervical high risk human papillomavirus (HPV) DNA test positive: Secondary | ICD-10-CM

## 2017-07-14 DIAGNOSIS — E7800 Pure hypercholesterolemia, unspecified: Secondary | ICD-10-CM

## 2017-07-14 DIAGNOSIS — F419 Anxiety disorder, unspecified: Secondary | ICD-10-CM

## 2017-07-14 DIAGNOSIS — R5383 Other fatigue: Secondary | ICD-10-CM | POA: Diagnosis not present

## 2017-07-14 LAB — URINALYSIS, ROUTINE W REFLEX MICROSCOPIC
BILIRUBIN URINE: NEGATIVE
Hgb urine dipstick: NEGATIVE
Ketones, ur: NEGATIVE
Leukocytes, UA: NEGATIVE
NITRITE: NEGATIVE
PH: 7 (ref 5.0–8.0)
RBC / HPF: NONE SEEN (ref 0–?)
Specific Gravity, Urine: <=1.005 — AB (ref 1.000–1.030)
TOTAL PROTEIN, URINE-UPE24: NEGATIVE
URINE GLUCOSE: NEGATIVE
UROBILINOGEN UA: 0.2 (ref 0.0–1.0)
WBC, UA: NONE SEEN (ref 0–?)

## 2017-07-14 MED ORDER — BUPROPION HCL ER (XL) 150 MG PO TB24: 150.0000 mg | ORAL_TABLET | Freq: Every day | ORAL | 2 refills | Status: DC

## 2017-07-14 NOTE — Telephone Encounter (Signed)
-----   Message from Delorise Jackson, MD sent at 07/14/2017  2:29 PM EST ----- Please call patient Dr. Grayland Ormond stated its ok to get flu shot. Can come here for pharmacy sch RN visit if comes here   Thanks TSM ----- Message ----- From: Lloyd Huger, MD Sent: 07/14/2017   1:38 PM To: Nino Glow McLean-Scocuzza, MD  Absolutely.  Thank you.  ----- Message ----- From: McLean-Scocuzza, Nino Glow, MD Sent: 07/14/2017   1:11 PM To: Lloyd Huger, MD  Hello Dr. Grayland Ormond   WBC 2.1 05/2017 with h/o CLL Is this ok to give Flu shot. She is feeling ok  Thanks Olivia Mackie McLean-Scocuzza

## 2017-07-14 NOTE — Progress Notes (Signed)
Chief Complaint  Patient presents with  . Establish Care   New patient to establish care  1. C/o depressed mood intermittently and anxiety and stress. A lot comes from the changes of having been dx'ed with CLL 3 years ago and the changes her body has been through. Wellbutrin has helped in the past. Talking to therapist has not. She wants to resume Wellbutrin  2. She is interested in vaccines 3. C/o fatigue 4. H/o CLL Dr. Grayland Ormond is Onc MD. She has been on Bendamustine In the past per pt she has had 4 rounds of chemo but CLL will resolve and come back. reviewed recent CT neck/chest/ab/pelvis and great response to treatment but noted right thyroid nodule and rec bx .Will work up today     Review of Systems  Constitutional: Positive for fever. Negative for weight loss.  HENT: Positive for hearing loss and tinnitus.        +h/o sores in mouth  Eyes:       +seeing floaters  +trouble focusing eyes  Respiratory: Negative for shortness of breath.   Cardiovascular: Negative for chest pain.       +h/o swelling of hands and feet  +h/o swelling right calf and pain intermittently   Gastrointestinal: Positive for nausea. Negative for abdominal pain, blood in stool and constipation.  Genitourinary: Negative for dysuria.  Musculoskeletal: Positive for joint pain.  Skin:       +new or changing mole   Neurological: Positive for dizziness.       +h/o stabbing pains and numbness left thigh intermittently   Psychiatric/Behavioral: Positive for depression and memory loss. The patient is nervous/anxious.        +stress    Past Medical History:  Diagnosis Date  . Chest pain   . CLL (chronic lymphocytic leukemia) ([place])   . CLL (chronic lymphocytic leukemia) ([place])   . CLL (chronic lymphocytic leukemia) ([place])   . Complication of anesthesia    woke up with severe cramps in extremities (26 years ago)  . Hyperlipidemia    Past Surgical History:  Procedure Laterality Date  . HYSTEROSCOPY W/D&C N/A  06/02/2016   Procedure: DILATATION AND CURETTAGE /HYSTEROSCOPY;  Surgeon: Brayton Mars, MD;  Location: [facility] ORS;  Service: Gynecology;  Laterality: N/A;  . TUBAL LIGATION     Family History  Problem Relation Age of Onset  . Stroke Mother 67       CVA  . Diabetes Mother   . Deep vein thrombosis Brother 45       recurrent DVTs.  . Stroke Maternal Grandmother   . Colon cancer Maternal Grandmother   . Stroke Maternal Grandfather   . Diabetes Maternal Grandfather   . Diabetes Sister   . Breast cancer Neg Hx   . Ovarian cancer Neg Hx    Social History   Socioeconomic History  . Marital status: Legally Separated    Spouse name: Not on file  . Number of children: Not on file  . Years of education: Not on file  . Highest education level: Not on file  Social Needs  . Financial resource strain: Not on file  . Food insecurity - worry: Not on file  . Food insecurity - inability: Not on file  . Transportation needs - medical: Not on file  . Transportation needs - non-medical: Not on file  Occupational History  . Not on file  Tobacco Use  . Smoking status: Never Smoker  . Smokeless tobacco: Never Used  Substance  and Sexual Activity  . Alcohol use: No  . Drug use: No  . Sexual activity: Not Currently    Birth control/protection: Surgical  Other Topics Concern  . Not on file  Social History Narrative  . Not on file   Current Meds  Medication Sig  . acyclovir (ZOVIRAX) 400 MG tablet Take 1 tablet (400 mg total) by mouth daily.  Marland Kitchen b complex vitamins capsule Take 1 capsule by mouth daily.  . Cholecalciferol (VITAMIN D3) 2000 units TABS Take by mouth daily.   No Known Allergies Recent Results (from the past 2160 hour(s))  CBC with Differential     Status: Abnormal   Collection Time: 04/27/17  8:21 AM  Result Value Ref Range   WBC 2.4 (L) 3.6 - 11.0 K/uL   RBC 4.28 3.80 - 5.20 MIL/uL   Hemoglobin 12.2 12.0 - 16.0 g/dL   HCT 35.2 35.0 - 47.0 %   MCV 82.2 80.0 - 100.0  fL   MCH 28.4 26.0 - 34.0 pg   MCHC 34.6 32.0 - 36.0 g/dL   RDW 16.0 (H) 11.5 - 14.5 %   Platelets 230 150 - 440 K/uL   Neutrophils Relative % 58 %   Neutro Abs 1.4 1.4 - 6.5 K/uL   Lymphocytes Relative 21 %   Lymphs Abs 0.5 (L) 1.0 - 3.6 K/uL   Monocytes Relative 16 %   Monocytes Absolute 0.4 0.2 - 0.9 K/uL   Eosinophils Relative 3 %   Eosinophils Absolute 0.1 0 - 0.7 K/uL   Basophils Relative 2 %   Basophils Absolute 0.1 0 - 0.1 K/uL  Comprehensive metabolic panel     Status: Abnormal   Collection Time: 04/27/17  8:21 AM  Result Value Ref Range   Sodium 137 135 - 145 mmol/L   Potassium 3.8 3.5 - 5.1 mmol/L   Chloride 106 101 - 111 mmol/L   CO2 26 22 - 32 mmol/L   Glucose, Bld 92 65 - 99 mg/dL   BUN 17 6 - 20 mg/dL   Creatinine, Ser 0.93 0.44 - 1.00 mg/dL   Calcium 8.9 8.9 - 10.3 mg/dL   Total Protein 7.0 6.5 - 8.1 g/dL   Albumin 4.2 3.5 - 5.0 g/dL   AST 18 15 - 41 U/L   ALT 11 (L) 14 - 54 U/L   Alkaline Phosphatase 50 38 - 126 U/L   Total Bilirubin 0.5 0.3 - 1.2 mg/dL   GFR calc non Af Amer >60 >60 mL/min   GFR calc Af Amer >60 >60 mL/min    Comment: (NOTE) The eGFR has been calculated using the CKD EPI equation. This calculation has not been validated in all clinical situations. eGFR's persistently <60 mL/min signify possible Chronic Kidney Disease.    Anion gap 5 5 - 15  CBC with Differential     Status: Abnormal   Collection Time: 05/22/17  9:48 AM  Result Value Ref Range   WBC 2.1 (L) 3.6 - 11.0 K/uL   RBC 4.38 3.80 - 5.20 MIL/uL   Hemoglobin 12.3 12.0 - 16.0 g/dL   HCT 35.9 35.0 - 47.0 %   MCV 82.0 80.0 - 100.0 fL   MCH 28.2 26.0 - 34.0 pg   MCHC 34.3 32.0 - 36.0 g/dL   RDW 15.3 (H) 11.5 - 14.5 %   Platelets 233 150 - 440 K/uL   Neutrophils Relative % 59 %   Neutro Abs 1.2 (L) 1.4 - 6.5 K/uL   Lymphocytes Relative 23 %  Lymphs Abs 0.5 (L) 1.0 - 3.6 K/uL   Monocytes Relative 15 %   Monocytes Absolute 0.3 0.2 - 0.9 K/uL   Eosinophils Relative 1 %    Eosinophils Absolute 0.0 0 - 0.7 K/uL   Basophils Relative 2 %   Basophils Absolute 0.0 0 - 0.1 K/uL  Comprehensive metabolic panel     Status: None   Collection Time: 05/22/17  9:48 AM  Result Value Ref Range   Sodium 139 135 - 145 mmol/L   Potassium 3.7 3.5 - 5.1 mmol/L   Chloride 103 101 - 111 mmol/L   CO2 28 22 - 32 mmol/L   Glucose, Bld 99 65 - 99 mg/dL   BUN 14 6 - 20 mg/dL   Creatinine, Ser 0.88 0.44 - 1.00 mg/dL   Calcium 9.4 8.9 - 10.3 mg/dL   Total Protein 7.0 6.5 - 8.1 g/dL   Albumin 4.3 3.5 - 5.0 g/dL   AST 20 15 - 41 U/L   ALT 16 14 - 54 U/L   Alkaline Phosphatase 52 38 - 126 U/L   Total Bilirubin 0.6 0.3 - 1.2 mg/dL   GFR calc non Af Amer >60 >60 mL/min   GFR calc Af Amer >60 >60 mL/min    Comment: (NOTE) The eGFR has been calculated using the CKD EPI equation. This calculation has not been validated in all clinical situations. eGFR's persistently <60 mL/min signify possible Chronic Kidney Disease.    Anion gap 8 5 - 15   Objective  Body mass index is 23.43 kg/m. Wt Readings from Last 3 Encounters:  07/14/17 147 lb 6 oz (66.8 kg)  05/25/17 144 lb (65.3 kg)  04/27/17 143 lb 8 oz (65.1 kg)   Temp Readings from Last 3 Encounters:  07/14/17 98.3 F (36.8 C) (Oral)  05/25/17 (!) 97.5 F (36.4 C)  04/28/17 (!) 97 F (36.1 C) (Tympanic)   BP Readings from Last 3 Encounters:  07/14/17 126/70  05/25/17 120/73  04/28/17 97/60   Pulse Readings from Last 3 Encounters:  07/14/17 65  05/25/17 74  04/28/17 66   Physical Exam  Constitutional: She is oriented to person, place, and time and well-developed, well-nourished, and in no distress. Vital signs are normal.  HENT:  Head: Normocephalic and atraumatic.  Eyes: Conjunctivae are normal. Pupils are equal, round, and reactive to light. No scleral icterus.  Cardiovascular: Normal rate, regular rhythm and normal heart sounds.  No murmur heard. Neg leg edema b/l   Pulmonary/Chest: Effort normal and breath  sounds normal.  Abdominal: Soft. Bowel sounds are normal. There is no tenderness.  Neurological: She is alert and oriented to person, place, and time. Gait normal.  Skin: Skin is warm and dry.  Psychiatric: Mood, memory, affect and judgment normal.  Nursing note and vitals reviewed.  Assessment   1. Depression and h/o anxiety  2. H/o Vit D def  3. Leukopenia with h/o CLL 4. Abnormal vision (I.e floaters and trouble focusing)  5. Right thyroid nodule noted CT neck 05/22/17  6. HM  Plan  1.  Start Wellbutrin again 150 XL qd  Declines to see therapy not helpful in the past  2.  Check vit D today  3. F/u Dr. Grayland Ormond  Check CBC today  4. Refer to [facility] in [place] opthlamology to check   5.  Complete thyroid panel and thyroid US  May have to see Endocrine in the future as CT rec bx   6.  Will ask H/O if  ok to give flu shot  Given Tdap today  Disc shingrix and given info  Hep B not immune will disc rec in future   Screen for hep C and quantiferon gold today  HIV neg 05/29/16   Check UA today   Will have to do lipid in future not fasting   Of note still having menses last was November which is the 1st since last March per pt  -OB/GYN Dr. Enzo Bi pap had 10/2016 neg pap +HPV; colposcopy 11/11/16 inadequate colpo per notes repeat pap and colpo in 6 months w -will refer back to OB/GYN   Referred for mammo  Will need to have colonoscopy in future never had   Will need to see dermatology in future   Of note consider repeat of RF in the future elevated   Provider: Dr. Olivia Mackie McLean-Scocuzza

## 2017-07-14 NOTE — Telephone Encounter (Signed)
Left message to return call, ok for [facility] to  speak to patient  

## 2017-07-14 NOTE — Telephone Encounter (Signed)
Pt states she needs to sch lab appt. Order needed please and thank you!

## 2017-07-14 NOTE — Patient Instructions (Signed)
Follow up in 6 weeks  Consider shingrix and hepatitis B vaccines  Will be in touch about flu vaccine  We will do labs and refer you to eye and thyroid ultrasound and mammogram Take care   Major Depressive Disorder, Adult Major depressive disorder (MDD) is a mental health condition. MDD often makes you feel sad, hopeless, or helpless. MDD can also cause symptoms in your body. MDD can affect your:  Work.  School.  Relationships.  Other normal activities.  MDD can range from mild to very bad. It may occur once (single episode MDD). It can also occur many times (recurrent MDD). The main symptoms of MDD often include:  Feeling sad, depressed, or irritable most of the time.  Loss of interest.  MDD symptoms also include:  Sleeping too much or too little.  Eating too much or too little.  A change in your weight.  Feeling tired (fatigue) or having low energy.  Feeling worthless.  Feeling guilty.  Trouble making decisions.  Trouble thinking clearly.  Thoughts of suicide or harming others.  Feeling weak.  Feeling agitated.  Keeping yourself from being around other people (isolation).  Follow these instructions at home: Activity  Do these things as told by your doctor: ? Go back to your normal activities. ? Exercise regularly. ? Spend time outdoors. Alcohol  Talk with your doctor about how alcohol can affect your antidepressant medicines.  Do not drink alcohol. Or, limit how much alcohol you drink. ? This means no more than 1 drink a day for nonpregnant women and 2 drinks a day for men. One drink equals one of these:  12 oz of beer.  5 oz of wine.  1 oz of hard liquor. General instructions  Take over-the-counter and prescription medicines only as told by your doctor.  Eat a healthy diet.  Get plenty of sleep.  Find activities that you enjoy. Make time to do them.  Think about joining a support group. Your doctor may be able to suggest a group for  you.  Keep all follow-up visits as told by your doctor. This is important. Where to find more information:  [employer] on Mental Illness: ? www.nami.[place]: ? https://carter.com/  National Suicide Prevention Lifeline: ? 2798620329. This is free, 24-hour help. Contact a doctor if:  Your symptoms get worse.  You have new symptoms. Get help right away if:  You self-harm.  You see, hear, taste, smell, or feel things that are not present (hallucinate). If you ever feel like you may hurt yourself or others, or have thoughts about taking your own life, get help right away. You can go to your nearest emergency department or call:  Your local emergency services (911 in the [place]  A suicide crisis helpline, such as the National Suicide Prevention Lifeline: ? (346)746-3961. This is open 24 hours a day.  This information is not intended to replace advice given to you by your health care provider. Make sure you discuss any questions you have with your health care provider. Document Released: 07/16/2015 Document Revised: 04/20/2016 Document Reviewed: 04/20/2016 Elsevier Interactive Patient Education  2017 Elsevier Inc.  Thyroid Nodule A thyroid nodule is an isolatedgrowth of thyroid cells that forms a lump in your thyroid gland. The thyroid gland is a butterfly-shaped gland. It is found in the lower front of your neck. This gland sends chemical messengers (hormones) through your blood to all parts of your body. These hormones are important in regulating  your body temperature and helping your body to use energy. Thyroid nodules are common. Most are not cancerous (are benign). You may have one nodule or several nodules. Different types of thyroid nodules include:  Nodules that grow and fill with fluid (thyroid cysts).  Nodules that produce too much thyroid hormone (hot nodules or hyperthyroid).  Nodules that produce no thyroid hormone (cold  nodules or hypothyroid).  Nodules that form from cancer cells (thyroid cancers).  What are the causes? Usually, the cause of this condition is not known. What increases the risk? Factors that make this condition more likely to develop include:  Increasing age. Thyroid nodules become more common in people who are older than 59 years of age.  Gender. ? Benign thyroid nodules are more common in women. ? Cancerous (malignant) thyroid nodules are more common in men.  A family history that includes: ? Thyroid nodules. ? Pheochromocytoma. ? Thyroid carcinoma. ? Hyperparathyroidism.  Certain kinds of thyroid diseases, such as Hashimoto thyroiditis.  Lack of iodine.  A history of head and neck radiation, such as from X-rays.  What are the signs or symptoms? It is common for this condition to cause no symptoms. If you have symptoms, they may include:  A lump in your lower neck.  Feeling a lump or tickle in your throat.  Pain in your neck, jaw, or ear.  Having trouble swallowing.  Hot nodules may cause symptoms that include:  Weight loss.  Warm, flushed skin.  Feeling hot.  Feeling nervous.  A racing heartbeat.  Cold nodules may cause symptoms that include:  Weight gain.  Dry skin.  Brittle hair. This may also occur with hair loss.  Feeling cold.  Fatigue.  Thyroid cancer nodules may cause symptoms that include:  Hard nodules that feel stuck to the thyroid gland.  Hoarseness.  Lumps in the glands near your thyroid (lymph nodes).  How is this diagnosed? A thyroid nodule may be felt by your health care provider during a physical exam. This condition may also be diagnosed based on your symptoms. You may also have tests, including:  An ultrasound. This may be done to confirm the diagnosis.  A biopsy. This involves taking a sample from the nodule and looking at it under a microscope to see if the nodule is benign.  Blood tests to make sure that your  thyroid is working properly.  Imaging tests such as MRI or CT scan may be done if: ? Your nodule is large. ? Your nodule is blocking your airway. ? Cancer is suspected.  How is this treated? Treatment depends on the cause and size of your nodule or nodules. If the nodule is benign, treatment may not be necessary. Your health care provider may monitor the nodule to see if it goes away without treatment. If the nodule continues to grow, is cancerous, or does not go away:  It may need to be drained with a needle.  It may need to be removed with surgery.  If you have surgery, part or all of your thyroid gland may need to be removed as well. Follow these instructions at home:  Pay attention to any changes in your nodule.  Take over-the-counter and prescription medicines only as told by your health care provider.  Keep all follow-up visits as told by your health care provider. This is important. Contact a health care provider if:  Your voice changes.  You have trouble swallowing.  You have pain in your neck, ear, or jaw that  is getting worse.  Your nodule gets bigger.  Your nodule starts to make it harder for you to breathe. Get help right away if:  You have a sudden fever.  You feel very weak.  Your muscles look like they are shrinking (muscle wasting).  You have mood swings.  You feel very restless.  You feel confused.  You are seeing or hearing things that other people do not see or hear (having hallucinations).  You feel suddenly nauseous or throw up.  You suddenly have diarrhea.  You have chest pain.  There is a loss of consciousness. This information is not intended to replace advice given to you by your health care provider. Make sure you discuss any questions you have with your health care provider. Document Released: 06/27/2004 Document Revised: 04/06/2016 Document Reviewed: 11/15/2014 Elsevier Interactive Patient Education  2018 [employer].

## 2017-07-15 NOTE — Addendum Note (Signed)
Addended by: Arby Barrette on: 07/15/2017 11:25 AM   Modules accepted: Orders

## 2017-07-21 ENCOUNTER — Telehealth: Payer: Self-pay | Admitting: Internal Medicine

## 2017-07-21 DIAGNOSIS — Z1231 Encounter for screening mammogram for malignant neoplasm of breast: Secondary | ICD-10-CM

## 2017-07-21 NOTE — Telephone Encounter (Signed)
Mychart message sent.

## 2017-07-21 NOTE — Telephone Encounter (Signed)
Order need to read [facility] 3D IMG [phone]. Please and thank you!

## 2017-07-22 ENCOUNTER — Other Ambulatory Visit (INDEPENDENT_AMBULATORY_CARE_PROVIDER_SITE_OTHER): Payer: 59

## 2017-07-22 DIAGNOSIS — C9110 Chronic lymphocytic leukemia of B-cell type not having achieved remission: Secondary | ICD-10-CM

## 2017-07-22 DIAGNOSIS — C9190 Lymphoid leukemia, unspecified not having achieved remission: Secondary | ICD-10-CM

## 2017-07-22 DIAGNOSIS — Z111 Encounter for screening for respiratory tuberculosis: Secondary | ICD-10-CM | POA: Diagnosis not present

## 2017-07-22 DIAGNOSIS — E7800 Pure hypercholesterolemia, unspecified: Secondary | ICD-10-CM

## 2017-07-22 DIAGNOSIS — E559 Vitamin D deficiency, unspecified: Secondary | ICD-10-CM | POA: Diagnosis not present

## 2017-07-22 DIAGNOSIS — D72819 Decreased white blood cell count, unspecified: Secondary | ICD-10-CM

## 2017-07-22 DIAGNOSIS — E041 Nontoxic single thyroid nodule: Secondary | ICD-10-CM | POA: Diagnosis not present

## 2017-07-22 LAB — CBC
HCT: 36.6 % (ref 36.0–46.0)
Hemoglobin: 12.1 g/dL (ref 12.0–15.0)
MCHC: 32.9 g/dL (ref 30.0–36.0)
MCV: 85.3 fl (ref 78.0–100.0)
Platelets: 248 10*3/uL (ref 150.0–400.0)
RBC: 4.3 Mil/uL (ref 3.87–5.11)
RDW: 15.2 % (ref 11.5–15.5)
WBC: 2.8 10*3/uL — ABNORMAL LOW (ref 4.0–10.5)

## 2017-07-22 LAB — VITAMIN D 25 HYDROXY (VIT D DEFICIENCY, FRACTURES): VITD: 27.52 ng/mL — ABNORMAL LOW (ref 30.00–100.00)

## 2017-07-22 LAB — T4, FREE: FREE T4: 0.77 ng/dL (ref 0.60–1.60)

## 2017-07-22 LAB — T3, FREE: T3 FREE: 3.3 pg/mL (ref 2.3–4.2)

## 2017-07-23 LAB — HEPATITIS C ANTIBODY
HEP C AB: NONREACTIVE
SIGNAL TO CUT-OFF: 0.01 (ref <1.00)

## 2017-07-23 NOTE — Telephone Encounter (Signed)
Please see order.  Thank you.

## 2017-07-24 LAB — QUANTIFERON-TB GOLD PLUS
NIL: 0.08 [IU]/mL
QuantiFERON-TB Gold Plus: NEGATIVE

## 2017-07-29 NOTE — Telephone Encounter (Signed)
Corrected order

## 2017-08-23 NOTE — Progress Notes (Signed)
[place]  Telephone:([phone]) [phone] Fax:(336) [phone]  ID: Riesa Pope OB: Mar 05, 1956  MR#: [id number]  XQJ#:[id number]  Patient Care Team: McLean-Scocuzza, Nino Glow, MD as PCP - General (Internal Medicine)  CHIEF COMPLAINT:  CLL, Rai stage 1  INTERVAL HISTORY: Patient returns to clinic today for repeat laboratory work and further evaluation.  She currently feels well and is asymptomatic.  She denies any weakness or fatigue and continues to work full-time. She denies any fevers, night sweats, or weight loss. She has no neurologic complaints. She has no chest pain and denies any shortness of breath or cough.  She denies any vomiting, constipation, or diarrhea.  She has no melena or hematochezia.  She has no urinary complaints.  Patient offers no specific complaints today.  REVIEW OF SYSTEMS:   Review of Systems  Constitutional: Negative for diaphoresis, fever, malaise/fatigue and weight loss.  Respiratory: Negative.  Negative for cough and shortness of breath.   Cardiovascular: Negative for chest pain and leg swelling.  Gastrointestinal: Negative for abdominal pain, constipation and nausea.  Genitourinary: Negative.   Musculoskeletal: Negative.   Skin: Negative.  Negative for rash.  Neurological: Negative for sensory change, weakness and headaches.  Psychiatric/Behavioral: Negative.  The patient is not nervous/anxious.     As per HPI. Otherwise, a complete review of systems is negative.  PAST MEDICAL HISTORY: Past Medical History:  Diagnosis Date  . Anxiety   . Chest pain   . Chicken pox   . CLL (chronic lymphocytic leukemia) ([place])    dx'ed 2015   . Complication of anesthesia    woke up with severe cramps in extremities (26 years ago)  . DUB (dysfunctional uterine bleeding)    s/p D&C Dr. Enzo Bi   . Dysthymia   . Hyperlipidemia   . Influenza    history of x 2   . Personal history of chemotherapy   . Splenomegaly    hx of    PAST SURGICAL  HISTORY: Past Surgical History:  Procedure Laterality Date  . HYSTEROSCOPY W/D&C N/A 06/02/2016   Procedure: DILATATION AND CURETTAGE /HYSTEROSCOPY 2/2 DUB;  Surgeon: Brayton Mars, MD;  Location: [facility] ORS;  Service: Gynecology;  Laterality: N/A;  . TUBAL LIGATION      FAMILY HISTORY Family History  Problem Relation Age of Onset  . Stroke Mother 15       CVA  . Arthritis Mother   . Deep vein thrombosis Brother 45       recurrent DVTs.  . Stroke Maternal Grandmother   . Colon cancer Maternal Grandmother   . Cancer Maternal Grandmother        colon cancer dx'ed 3s   . Stroke Maternal Grandfather   . Diabetes Maternal Grandfather   . Diabetes Sister   . Breast cancer Neg Hx   . Ovarian cancer Neg Hx        ADVANCED DIRECTIVES:    HEALTH MAINTENANCE: Social History   Tobacco Use  . Smoking status: Never Smoker  . Smokeless tobacco: Never Used  Substance Use Topics  . Alcohol use: Yes    Comment: occasionally   . Drug use: No     No Known Allergies  Current Outpatient Medications  Medication Sig Dispense Refill  . acyclovir (ZOVIRAX) 400 MG tablet Take 1 tablet (400 mg total) by mouth daily. 30 tablet 3  . b complex vitamins capsule Take 1 capsule by mouth daily.    Marland Kitchen buPROPion (WELLBUTRIN XL) 300 MG 24 hr tablet  Take 1 tablet (300 mg total) by mouth daily. In am 30 tablet 2  . Cholecalciferol (VITAMIN D3) 2000 units TABS Take by mouth daily.    . meloxicam (MOBIC) 7.5 MG tablet Take 1 tablet (7.5 mg total) by mouth 2 (two) times daily as needed for pain. 60 tablet 2   No current facility-administered medications for this visit.     OBJECTIVE: Vitals:   08/26/17 1529  BP: 130/72  Pulse: 76  Resp: 20  Temp: (!) 96.8 F (36 C)     Body mass index is 23.64 kg/m.    ECOG FS:0 - Asymptomatic  General: Well-developed, well-nourished, no acute distress. Eyes: anicteric sclera. HEENT: Cervical lymphadenopathy resolved Lungs: Clear to auscultation  bilaterally. Heart: Regular rate and rhythm. No rubs, murmurs, or gallops. Abdomen: Soft, nontender, nondistended. No organomegaly noted, normoactive bowel sounds. Musculoskeletal: No edema, cyanosis, or clubbing. Neuro: Alert, answering all questions appropriately. Cranial nerves grossly intact. Skin: No rashes or petechiae noted. Psych: Normal affect.    LAB RESULTS:  Lab Results  Component Value Date   NA 138 08/26/2017   K 4.2 08/26/2017   CL 102 08/26/2017   CO2 27 08/26/2017   GLUCOSE 101 (H) 08/26/2017   BUN 18 08/26/2017   CREATININE 0.93 08/26/2017   CALCIUM 9.4 08/26/2017   PROT 7.4 08/26/2017   ALBUMIN 4.5 08/26/2017   AST 20 08/26/2017   ALT 15 08/26/2017   ALKPHOS 60 08/26/2017   BILITOT 0.5 08/26/2017   GFRNONAA >60 08/26/2017   GFRAA >60 08/26/2017    Lab Results  Component Value Date   WBC 3.1 (L) 08/26/2017   NEUTROABS 2.0 08/26/2017   HGB 13.2 08/26/2017   HCT 39.4 08/26/2017   MCV 84.6 08/26/2017   PLT 246 08/26/2017     STUDIES: Ct Soft Tissue Neck W Contrast  Result Date: 08/24/2017 CLINICAL DATA:  Routine follow-up for CLL. EXAM: CT NECK WITH CONTRAST TECHNIQUE: Multidetector CT imaging of the neck was performed using the standard protocol following the bolus administration of intravenous contrast. CONTRAST:  132mL ISOVUE-300 IOPAMIDOL (ISOVUE-300) INJECTION 61% COMPARISON:  05/22/2017 older studies as distant as 01/17/2014 FINDINGS: Pharynx and larynx: No mucosal or submucosal lesion. Small left tonsillar cyst, not significant. Salivary glands: Parotid and submandibular glands are normal. Thyroid: Re- demonstration of a dominant solid nodule on the right measuring 2.2 cm with some internal calcification. This is unchanged since 2015. Lymph nodes: No enlarging or new lymph nodes. Index level 1 node on the left is unchanged at 4 mm. Vascular: Normal Limited intracranial: Normal Visualized orbits: Normal Mastoids and visualized paranasal sinuses: Clear  Skeleton: Normal except for ordinary spondylosis. Upper chest: See results of chest CT. Other: None IMPRESSION: Continued normal appearance of the lymph nodes in the neck. No new or enlarging nodes. Index level 1 node on the left unchanged at 4 mm. 2.2 cm right thyroid nodule, unchanged since 2015. Electronically Signed   By: Nelson Chimes M.D.   On: 08/24/2017 11:44   Ct Chest W Contrast  Result Date: 08/24/2017 CLINICAL DATA:  Chronic lymphocytic leukemia, finished chemotherapy 1 month ago, assessment for response to therapy and restaging. Chronic back pain. EXAM: CT CHEST, ABDOMEN, AND PELVIS WITH CONTRAST TECHNIQUE: Multidetector CT imaging of the chest, abdomen and pelvis was performed following the standard protocol during bolus administration of intravenous contrast. CONTRAST:  111mL ISOVUE-300 IOPAMIDOL (ISOVUE-300) INJECTION 61% COMPARISON:  Multiple exams, including 05/22/2017 FINDINGS: CT CHEST FINDINGS Cardiovascular: Coronary, aortic arch, and branch vessel atherosclerotic  vascular disease. Mediastinum/Nodes: We partially include a solid-appearing right thyroid nodule measuring up to 2.3 cm diameter. Small bilateral axillary lymph nodes are present ; an index left axillary lymph node measures 0.7 cm in short axis on image 15/3 (formerly 0.8 cm on 05/22/2017 and formerly 2.2 cm on 12/30/2016). A right lower para-aortic lymph node measures 0.6 cm on image 48/3 (formerly 0.6 cm on 05/22/2017 and formerly 1.0 cm on 12/30/2016). Lungs/Pleura: Stable tiny pleural-based nodule along the right middle lobe measuring 0.5 by 0.2 cm, image 33/3. Musculoskeletal: Unremarkable CT ABDOMEN PELVIS FINDINGS Hepatobiliary: 3 mm hypodense lesion in segment 8 of the liver on image 44/3, stable but technically nonspecific due to small size. Gallbladder unremarkable. Pancreas: Pancreas divisum. Spleen: Unremarkable Adrenals/Urinary Tract: Stable bilateral renal cysts. Adrenal glands normal. Stomach/Bowel: Prominent stool  throughout the colon favors constipation. Vascular/Lymphatic: Aortoiliac atherosclerotic vascular disease. Portacaval lymph node 1.1 cm in short axis on image 55/3, formerly 1.5 cm by my measurement. Left periaortic node 0.9 cm in short axis on image 66/3, formerly the same. Upper normal sized bilateral external iliac and inguinal lymph nodes are present. Reproductive: Nodularity and heterogeneity of the myometrium favoring uterine fibroids. Other: No supplemental non-categorized findings. Musculoskeletal: Lower lumbar spondylosis and degenerative disc disease causing impingement at L4-5 at L5-S1. IMPRESSION: 1. No overt recurrent adenopathy ; upper normal sized retroperitoneal lymph nodes are noted. 2.  Prominent stool throughout the colon favors constipation. 3. Stable right thyroid nodule. 4. Other imaging findings of potential clinical significance: Aortic Atherosclerosis (ICD10-I70.0). Coronary atherosclerosis. 5. Uterine fibroids. 6. Lumbar impingement at L4-5 at L5-S1. Electronically Signed   By: Van Clines M.D.   On: 08/24/2017 12:16   Ct Abdomen Pelvis W Contrast  Result Date: 08/24/2017 CLINICAL DATA:  Chronic lymphocytic leukemia, finished chemotherapy 1 month ago, assessment for response to therapy and restaging. Chronic back pain. EXAM: CT CHEST, ABDOMEN, AND PELVIS WITH CONTRAST TECHNIQUE: Multidetector CT imaging of the chest, abdomen and pelvis was performed following the standard protocol during bolus administration of intravenous contrast. CONTRAST:  163mL ISOVUE-300 IOPAMIDOL (ISOVUE-300) INJECTION 61% COMPARISON:  Multiple exams, including 05/22/2017 FINDINGS: CT CHEST FINDINGS Cardiovascular: Coronary, aortic arch, and branch vessel atherosclerotic vascular disease. Mediastinum/Nodes: We partially include a solid-appearing right thyroid nodule measuring up to 2.3 cm diameter. Small bilateral axillary lymph nodes are present ; an index left axillary lymph node measures 0.7 cm in short  axis on image 15/3 (formerly 0.8 cm on 05/22/2017 and formerly 2.2 cm on 12/30/2016). A right lower para-aortic lymph node measures 0.6 cm on image 48/3 (formerly 0.6 cm on 05/22/2017 and formerly 1.0 cm on 12/30/2016). Lungs/Pleura: Stable tiny pleural-based nodule along the right middle lobe measuring 0.5 by 0.2 cm, image 33/3. Musculoskeletal: Unremarkable CT ABDOMEN PELVIS FINDINGS Hepatobiliary: 3 mm hypodense lesion in segment 8 of the liver on image 44/3, stable but technically nonspecific due to small size. Gallbladder unremarkable. Pancreas: Pancreas divisum. Spleen: Unremarkable Adrenals/Urinary Tract: Stable bilateral renal cysts. Adrenal glands normal. Stomach/Bowel: Prominent stool throughout the colon favors constipation. Vascular/Lymphatic: Aortoiliac atherosclerotic vascular disease. Portacaval lymph node 1.1 cm in short axis on image 55/3, formerly 1.5 cm by my measurement. Left periaortic node 0.9 cm in short axis on image 66/3, formerly the same. Upper normal sized bilateral external iliac and inguinal lymph nodes are present. Reproductive: Nodularity and heterogeneity of the myometrium favoring uterine fibroids. Other: No supplemental non-categorized findings. Musculoskeletal: Lower lumbar spondylosis and degenerative disc disease causing impingement at L4-5 at L5-S1. IMPRESSION: 1. No overt  recurrent adenopathy ; upper normal sized retroperitoneal lymph nodes are noted. 2.  Prominent stool throughout the colon favors constipation. 3. Stable right thyroid nodule. 4. Other imaging findings of potential clinical significance: Aortic Atherosclerosis (ICD10-I70.0). Coronary atherosclerosis. 5. Uterine fibroids. 6. Lumbar impingement at L4-5 at L5-S1. Electronically Signed   By: Van Clines M.D.   On: 08/24/2017 12:16   Mm Digital Screening Bilateral  Result Date: 08/25/2017 CLINICAL DATA:  Screening. EXAM: DIGITAL SCREENING BILATERAL MAMMOGRAM WITH CAD COMPARISON:  None. ACR Breast Density  Category c: The breast tissue is heterogeneously dense, which may obscure small masses FINDINGS: There are no findings suspicious for malignancy. Images were processed with CAD. IMPRESSION: No mammographic evidence of malignancy. A result letter of this screening mammogram will be mailed directly to the patient. RECOMMENDATION: Screening mammogram in one year. (Code:[id number]-[id number]) BI-RADS CATEGORY  1: Negative. Electronically Signed   By: Everlean Alstrom M.D.   On: 08/25/2017 16:28    ASSESSMENT: CLL, Rai stage 1  PLAN:    1.  CLL: Peripheral blood flow cytometry confirmed the results. Patient completed her second round of 4 cycles of weekly Rituxan on Jan 16, 2016. Patient could not tolerate Imbruvica. She completed cycle 3 of dose reduced Treanda and Rituxan on April 28, 2017. CT results from May 22, 2017 reviewed independently with an excellent response to treatment. No further intervention is needed at this time. Return to clinic in 3 months with repeat laboratory work and further evaluation.  Patient has requested only have imaging if there is suspicion of progressive disease. 2. Nausea: Resolved. Continue antiemetics as needed. 3. Leukopenia: Mild.  Secondary to treatment, monitor.  Approximately 20 minutes was spent in discussion of which 50% was consultation.   Patient expressed understanding and was in agreement with this plan. She also understands that She can call clinic at any time with any questions, concerns, or complaints.    Lloyd Huger, MD   08/30/2017 8:42 AM

## 2017-08-24 ENCOUNTER — Ambulatory Visit: Payer: 59

## 2017-08-24 ENCOUNTER — Ambulatory Visit
Admission: RE | Admit: 2017-08-24 | Discharge: 2017-08-24 | Disposition: A | Discharge status: Home / Self Care | Payer: 59 | Source: Ambulatory Visit | Attending: Oncology | Admitting: Oncology

## 2017-08-24 DIAGNOSIS — I700 Atherosclerosis of aorta: Secondary | ICD-10-CM | POA: Insufficient documentation

## 2017-08-24 DIAGNOSIS — C9190 Lymphoid leukemia, unspecified not having achieved remission: Secondary | ICD-10-CM | POA: Insufficient documentation

## 2017-08-24 DIAGNOSIS — I2510 Atherosclerotic heart disease of native coronary artery without angina pectoris: Secondary | ICD-10-CM | POA: Diagnosis not present

## 2017-08-24 DIAGNOSIS — E041 Nontoxic single thyroid nodule: Secondary | ICD-10-CM | POA: Diagnosis not present

## 2017-08-24 DIAGNOSIS — C9110 Chronic lymphocytic leukemia of B-cell type not having achieved remission: Secondary | ICD-10-CM

## 2017-08-24 DIAGNOSIS — D259 Leiomyoma of uterus, unspecified: Secondary | ICD-10-CM | POA: Insufficient documentation

## 2017-08-24 MED ORDER — IOPAMIDOL (ISOVUE-300) INJECTION 61%
100.0000 mL | Freq: Once | INTRAVENOUS | Status: AC | PRN
  Administered 2017-08-24: 100 mL via INTRAVENOUS

## 2017-08-25 ENCOUNTER — Encounter: Payer: Self-pay | Admitting: Internal Medicine

## 2017-08-25 ENCOUNTER — Ambulatory Visit: Payer: 59 | Admitting: Internal Medicine

## 2017-08-25 ENCOUNTER — Ambulatory Visit
Admission: RE | Admit: 2017-08-25 | Discharge: 2017-08-25 | Disposition: A | Discharge status: Home / Self Care | Payer: 59 | Source: Ambulatory Visit | Attending: Internal Medicine | Admitting: Internal Medicine

## 2017-08-25 VITALS — BP 140/72 | HR 81 | Temp 98.1°F | Ht 66.5 in | Wt 148.2 lb

## 2017-08-25 DIAGNOSIS — M549 Dorsalgia, unspecified: Secondary | ICD-10-CM | POA: Diagnosis not present

## 2017-08-25 DIAGNOSIS — D72819 Decreased white blood cell count, unspecified: Secondary | ICD-10-CM

## 2017-08-25 DIAGNOSIS — C9190 Lymphoid leukemia, unspecified not having achieved remission: Secondary | ICD-10-CM

## 2017-08-25 DIAGNOSIS — E559 Vitamin D deficiency, unspecified: Secondary | ICD-10-CM

## 2017-08-25 DIAGNOSIS — R87810 Cervical high risk human papillomavirus (HPV) DNA test positive: Secondary | ICD-10-CM

## 2017-08-25 DIAGNOSIS — M25562 Pain in left knee: Secondary | ICD-10-CM

## 2017-08-25 DIAGNOSIS — G8929 Other chronic pain: Secondary | ICD-10-CM

## 2017-08-25 DIAGNOSIS — Z1231 Encounter for screening mammogram for malignant neoplasm of breast: Secondary | ICD-10-CM | POA: Insufficient documentation

## 2017-08-25 DIAGNOSIS — M25561 Pain in right knee: Secondary | ICD-10-CM

## 2017-08-25 DIAGNOSIS — Z8639 Personal history of other endocrine, nutritional and metabolic disease: Secondary | ICD-10-CM

## 2017-08-25 DIAGNOSIS — Z23 Encounter for immunization: Secondary | ICD-10-CM | POA: Diagnosis not present

## 2017-08-25 DIAGNOSIS — F419 Anxiety disorder, unspecified: Secondary | ICD-10-CM

## 2017-08-25 DIAGNOSIS — E785 Hyperlipidemia, unspecified: Secondary | ICD-10-CM

## 2017-08-25 DIAGNOSIS — D259 Leiomyoma of uterus, unspecified: Secondary | ICD-10-CM

## 2017-08-25 DIAGNOSIS — F341 Dysthymic disorder: Secondary | ICD-10-CM | POA: Diagnosis not present

## 2017-08-25 DIAGNOSIS — M545 Low back pain: Secondary | ICD-10-CM | POA: Diagnosis not present

## 2017-08-25 DIAGNOSIS — M25569 Pain in unspecified knee: Secondary | ICD-10-CM

## 2017-08-25 DIAGNOSIS — C9110 Chronic lymphocytic leukemia of B-cell type not having achieved remission: Secondary | ICD-10-CM

## 2017-08-25 HISTORY — DX: Personal history of antineoplastic chemotherapy: Z92.21

## 2017-08-25 MED ORDER — MELOXICAM 7.5 MG PO TABS: 7.5000 mg | ORAL_TABLET | Freq: Two times a day (BID) | ORAL | 2 refills | Status: DC | PRN

## 2017-08-25 MED ORDER — BUPROPION HCL ER (XL) 300 MG PO TB24: 300.0000 mg | ORAL_TABLET | Freq: Every day | ORAL | 2 refills | Status: DC

## 2017-08-25 NOTE — Patient Instructions (Signed)
Follow up in 2 months sooner if needed  Think about hepatitis B vaccine  Think about colonoscopy with Dr. Allen Norris and his partners  Take D3 2000 IU daily  Think about thyroid Ultrasound Take care   Knee Pain, Adult Knee pain in adults is common. It can be caused by many things, including:  Arthritis.  A fluid-filled sac (cyst) or growth in your knee.  An infection in your knee.  An injury that will not heal.  Damage, swelling, or irritation of the tissues that support your knee.  Knee pain is usually not a sign of a serious problem. The pain may go away on its own with time and rest. If it does not, a health care provider may order tests to find the cause of the pain. These may include:  Imaging tests, such as an X-ray, MRI, or ultrasound.  Joint aspiration. In this test, fluid is removed from the knee.  Arthroscopy. In this test, a lighted tube is inserted into knee and an image is projected onto a TV screen.  A biopsy. In this test, a sample of tissue is removed from the body and studied under a microscope.  Follow these instructions at home: Pay attention to any changes in your symptoms. Take these actions to relieve your pain. Activity  Rest your knee.  Do not do things that cause pain or make pain worse.  Avoid high-impact activities or exercises, such as running, jumping rope, or doing jumping jacks. General instructions  Take over-the-counter and prescription medicines only as told by your health care provider.  Raise (elevate) your knee above the level of your heart when you are sitting or lying down.  Sleep with a pillow under your knee.  If directed, apply ice to the knee: ? Put ice in a plastic bag. ? Place a towel between your skin and the bag. ? Leave the ice on for 20 minutes, 2-3 times a day.  Ask your health care provider if you should wear an elastic knee support.  Lose weight if you are overweight. Extra weight can put pressure on your knee.  Do  not use any products that contain nicotine or tobacco, such as cigarettes and e-cigarettes. Smoking may slow the healing of any bone and joint problems that you may have. If you need help quitting, ask your health care provider. Contact a health care provider if:  Your knee pain continues, changes, or gets worse.  You have a fever along with knee pain.  Your knee buckles or locks up.  Your knee swells, and the swelling becomes worse. Get help right away if:  Your knee feels warm to the touch.  You cannot move your knee.  You have severe pain in your knee.  You have chest pain.  You have trouble breathing. Summary  Knee pain in adults is common. It can be caused by many things, including, arthritis, infection, cysts, or injury.  Knee pain is usually not a sign of a serious problem, but if it does not go away, a health care provider may perform tests to know the cause of the pain.  Pay attention to any changes in your symptoms. Relieve your pain with rest, medicines, light activity, and use of ice.  Get help if your pain continues or becomes very severe, or if your knee buckles or locks up, or if you have chest pain or trouble breathing. This information is not intended to replace advice given to you by your health care provider.  Make sure you discuss any questions you have with your health care provider. Document Released: 06/01/2007 Document Revised: 07/25/2016 Document Reviewed: 07/25/2016 Elsevier Interactive Patient Education  2018 [employer].  Back Pain, Adult Many adults have back pain from time to time. Common causes of back pain include:  A strained muscle or ligament.  Wear and tear (degeneration) of the spinal disks.  Arthritis.  A hit to the back.  Back pain can be short-lived (acute) or last a long time (chronic). A physical exam, lab tests, and imaging studies may be done to find the cause of your pain. Follow these instructions at home: Managing pain and  stiffness  Take over-the-counter and prescription medicines only as told by your health care provider.  If directed, apply heat to the affected area as often as told by your health care provider. Use the heat source that your health care provider recommends, such as a moist heat pack or a heating pad. ? Place a towel between your skin and the heat source. ? Leave the heat on for 20-30 minutes. ? Remove the heat if your skin turns bright red. This is especially important if you are unable to feel pain, heat, or cold. You have a greater risk of getting burned.  If directed, apply ice to the injured area: ? Put ice in a plastic bag. ? Place a towel between your skin and the bag. ? Leave the ice on for 20 minutes, 2-3 times a day for the first 2-3 days. Activity  Do not stay in bed. Resting more than 1-2 days can delay your recovery.  Take short walks on even surfaces as soon as you are able. Try to increase the length of time you walk each day.  Do not sit, drive, or stand in one place for more than 30 minutes at a time. Sitting or standing for long periods of time can put stress on your back.  Use proper lifting techniques. When you bend and lift, use positions that put less stress on your back: ? [place] your knees. ? Keep the load close to your body. ? Avoid twisting.  Exercise regularly as told by your health care provider. Exercising will help your back heal faster. This also helps prevent back injuries by keeping muscles strong and flexible.  Your health care provider may recommend that you see a physical therapist. This person can help you come up with a safe exercise program. Do any exercises as told by your physical therapist. Lifestyle  Maintain a healthy weight. Extra weight puts stress on your back and makes it difficult to have good posture.  Avoid activities or situations that make you feel anxious or stressed. Learn ways to manage anxiety and stress. One way to manage stress  is through exercise. Stress and anxiety increase muscle tension and can make back pain worse. General instructions  Sleep on a firm mattress in a comfortable position. Try lying on your side with your knees slightly bent. If you lie on your back, put a pillow under your knees.  Follow your treatment plan as told by your health care provider. This may include: ? Cognitive or behavioral therapy. ? Acupuncture or massage therapy. ? Meditation or yoga. Contact a health care provider if:  You have pain that is not relieved with rest or medicine.  You have increasing pain going down into your legs or buttocks.  Your pain does not improve in 2 weeks.  You have pain at night.  You lose  weight.  You have a fever or chills. Get help right away if:  You develop new bowel or bladder control problems.  You have unusual weakness or numbness in your arms or legs.  You develop nausea or vomiting.  You develop abdominal pain.  You feel faint. Summary  Many adults have back pain from time to time. A physical exam, lab tests, and imaging studies may be done to find the cause of your pain.  Use proper lifting techniques. When you bend and lift, use positions that put less stress on your back.  Take over-the-counter and prescription medicines and apply heat or ice as directed by your health care provider. This information is not intended to replace advice given to you by your health care provider. Make sure you discuss any questions you have with your health care provider. Document Released: 08/04/2005 Document Revised: 09/08/2016 Document Reviewed: 09/08/2016 Elsevier Interactive Patient Education  [employer].

## 2017-08-26 ENCOUNTER — Inpatient Hospital Stay: Payer: 59 | Attending: Oncology

## 2017-08-26 ENCOUNTER — Encounter: Payer: Self-pay | Admitting: Oncology

## 2017-08-26 ENCOUNTER — Other Ambulatory Visit: Payer: Self-pay

## 2017-08-26 ENCOUNTER — Encounter: Payer: Self-pay | Admitting: Internal Medicine

## 2017-08-26 ENCOUNTER — Inpatient Hospital Stay (HOSPITAL_BASED_OUTPATIENT_CLINIC_OR_DEPARTMENT_OTHER): Payer: 59 | Admitting: Oncology

## 2017-08-26 VITALS — BP 130/72 | HR 76 | Temp 96.8°F | Resp 20 | Wt 148.7 lb

## 2017-08-26 DIAGNOSIS — C9110 Chronic lymphocytic leukemia of B-cell type not having achieved remission: Secondary | ICD-10-CM

## 2017-08-26 DIAGNOSIS — M545 Low back pain, unspecified: Secondary | ICD-10-CM | POA: Insufficient documentation

## 2017-08-26 DIAGNOSIS — D72819 Decreased white blood cell count, unspecified: Secondary | ICD-10-CM | POA: Insufficient documentation

## 2017-08-26 DIAGNOSIS — Z79899 Other long term (current) drug therapy: Secondary | ICD-10-CM | POA: Insufficient documentation

## 2017-08-26 DIAGNOSIS — F419 Anxiety disorder, unspecified: Secondary | ICD-10-CM

## 2017-08-26 DIAGNOSIS — Z9221 Personal history of antineoplastic chemotherapy: Secondary | ICD-10-CM | POA: Insufficient documentation

## 2017-08-26 DIAGNOSIS — E785 Hyperlipidemia, unspecified: Secondary | ICD-10-CM

## 2017-08-26 DIAGNOSIS — Z800 Family history of malignant neoplasm of digestive organs: Secondary | ICD-10-CM | POA: Insufficient documentation

## 2017-08-26 DIAGNOSIS — M25569 Pain in unspecified knee: Secondary | ICD-10-CM | POA: Insufficient documentation

## 2017-08-26 DIAGNOSIS — Z8639 Personal history of other endocrine, nutritional and metabolic disease: Secondary | ICD-10-CM | POA: Insufficient documentation

## 2017-08-26 LAB — CBC WITH DIFFERENTIAL/PLATELET
BASOS ABS: 0 10*3/uL (ref 0–0.1)
Basophils Relative: 1 %
Eosinophils Absolute: 0.1 10*3/uL (ref 0–0.7)
Eosinophils Relative: 2 %
HEMATOCRIT: 39.4 % (ref 35.0–47.0)
HEMOGLOBIN: 13.2 g/dL (ref 12.0–16.0)
LYMPHS PCT: 22 %
Lymphs Abs: 0.7 10*3/uL — ABNORMAL LOW (ref 1.0–3.6)
MCH: 28.3 pg (ref 26.0–34.0)
MCHC: 33.4 g/dL (ref 32.0–36.0)
MCV: 84.6 fL (ref 80.0–100.0)
Monocytes Absolute: 0.4 10*3/uL (ref 0.2–0.9)
Monocytes Relative: 12 %
NEUTROS ABS: 2 10*3/uL (ref 1.4–6.5)
NEUTROS PCT: 63 %
Platelets: 246 10*3/uL (ref 150–440)
RBC: 4.66 MIL/uL (ref 3.80–5.20)
RDW: 14.7 % — ABNORMAL HIGH (ref 11.5–14.5)
WBC: 3.1 10*3/uL — AB (ref 3.6–11.0)

## 2017-08-26 LAB — COMPREHENSIVE METABOLIC PANEL
ALBUMIN: 4.5 g/dL (ref 3.5–5.0)
ALT: 15 U/L (ref 14–54)
ANION GAP: 9 (ref 5–15)
AST: 20 U/L (ref 15–41)
Alkaline Phosphatase: 60 U/L (ref 38–126)
BILIRUBIN TOTAL: 0.5 mg/dL (ref 0.3–1.2)
BUN: 18 mg/dL (ref 6–20)
CALCIUM: 9.4 mg/dL (ref 8.9–10.3)
CO2: 27 mmol/L (ref 22–32)
Chloride: 102 mmol/L (ref 101–111)
Creatinine, Ser: 0.93 mg/dL (ref 0.44–1.00)
GLUCOSE: 101 mg/dL — AB (ref 65–99)
POTASSIUM: 4.2 mmol/L (ref 3.5–5.1)
Sodium: 138 mmol/L (ref 135–145)
TOTAL PROTEIN: 7.4 g/dL (ref 6.5–8.1)

## 2017-08-26 NOTE — Progress Notes (Signed)
Chief Complaint  Patient presents with  . Follow-up   F/u  1. Pt doing well though cant tell any difference in her mood on Wellbutrin XL 150 and she had a bad experience with therapy in the past. And also other medications have not worked in the past. She is agreeable to increased dose  2. She is overall feeling well except for occasional low back back pain CT 08/2017 reviewed +lumbar impingement per pt mobic was given in the past ? Dose and it helped. She would like to try again. Back pain is worse on the weekends and she is lifting her grandkids 3. Reviewed labs low vit D rec 2000 IU qd  4. She did see the eye MD who noted floaters in eyes but NTD 5. CT scans neck + thyroid nodules but no change since 2015 disc thyroid US could look closer at thyroid in particular and CT neck 05/2017 rec this pt will think about It   Review of Systems  Constitutional: Negative for weight loss.  HENT: Negative for hearing loss.   Eyes:       +floaters saw eye MD  Respiratory: Negative for shortness of breath.   Cardiovascular: Negative for chest pain.  Gastrointestinal: Negative for abdominal pain.  Musculoskeletal: Positive for back pain.  Skin: Negative for rash.  Neurological: Negative for headaches.  Psychiatric/Behavioral: Positive for depression.   Past Medical History:  Diagnosis Date  . Anxiety   . Chest pain   . Chicken pox   . CLL (chronic lymphocytic leukemia) ([place])    dx'ed 2015   . Complication of anesthesia    woke up with severe cramps in extremities (26 years ago)  . DUB (dysfunctional uterine bleeding)    s/p D&C Dr. Enzo Bi   . Dysthymia   . Hyperlipidemia   . Influenza    history of x 2   . Personal history of chemotherapy   . Splenomegaly    hx of   Past Surgical History:  Procedure Laterality Date  . HYSTEROSCOPY W/D&C N/A 06/02/2016   Procedure: DILATATION AND CURETTAGE /HYSTEROSCOPY 2/2 DUB;  Surgeon: Brayton Mars, MD;  Location: [facility] ORS;  Service:  Gynecology;  Laterality: N/A;  . TUBAL LIGATION     Family History  Problem Relation Age of Onset  . Stroke Mother 12       CVA  . Arthritis Mother   . Deep vein thrombosis Brother 45       recurrent DVTs.  . Stroke Maternal Grandmother   . Colon cancer Maternal Grandmother   . Cancer Maternal Grandmother        colon cancer dx'ed 38s   . Stroke Maternal Grandfather   . Diabetes Maternal Grandfather   . Diabetes Sister   . Breast cancer Neg Hx   . Ovarian cancer Neg Hx    Social History   Socioeconomic History  . Marital status: Divorced    Spouse name: Not on file  . Number of children: Not on file  . Years of education: Not on file  . Highest education level: Not on file  Social Needs  . Financial resource strain: Not on file  . Food insecurity - worry: Not on file  . Food insecurity - inability: Not on file  . Transportation needs - medical: Not on file  . Transportation needs - non-medical: Not on file  Occupational History  . Not on file  Tobacco Use  . Smoking status: Never Smoker  . Smokeless tobacco:  Never Used  Substance and Sexual Activity  . Alcohol use: Yes    Comment: occasionally   . Drug use: No  . Sexual activity: Not Currently    Birth control/protection: Surgical  Other Topics Concern  . Not on file  Social History Narrative   B.S    Works at [employer]    3 kids (2 girls and 1 boy) and grandchildren    Current Meds  Medication Sig  . acyclovir (ZOVIRAX) 400 MG tablet Take 1 tablet (400 mg total) by mouth daily.  Marland Kitchen b complex vitamins capsule Take 1 capsule by mouth daily.  Marland Kitchen buPROPion (WELLBUTRIN XL) 300 MG 24 hr tablet Take 1 tablet (300 mg total) by mouth daily. In am  . Cholecalciferol (VITAMIN D3) 2000 units TABS Take by mouth daily.  . [DISCONTINUED] buPROPion (WELLBUTRIN XL) 150 MG 24 hr tablet Take 1 tablet (150 mg total) by mouth daily.   No Known Allergies Recent Results (from the past 2160 hour(s))  Urinalysis,  Routine w reflex microscopic     Status: Abnormal   Collection Time: 07/14/17 12:05 PM  Result Value Ref Range   Color, Urine YELLOW Yellow;Lt. Yellow   APPearance CLEAR Clear   Specific Gravity, Urine <=1.005 (A) 1.000 - 1.030   pH 7.0 5.0 - 8.0   Total Protein, Urine NEGATIVE Negative   Urine Glucose NEGATIVE Negative   Ketones, ur NEGATIVE Negative   Bilirubin Urine NEGATIVE Negative   Hgb urine dipstick NEGATIVE Negative   Urobilinogen, UA 0.2 0.0 - 1.0   Leukocytes, UA NEGATIVE Negative   Nitrite NEGATIVE Negative   WBC, UA none seen 0-2/hpf   RBC / HPF none seen 0-2/hpf  QuantiFERON-TB Gold Plus     Status: None   Collection Time: 07/22/17  2:50 PM  Result Value Ref Range   QuantiFERON-TB Gold Plus NEGATIVE NEGATIVE    Comment: Negative test result. M. tuberculosis complex  infection unlikely.    NIL 0.08 IU/mL   Mitogen-NIL >10.00 IU/mL   TB1-NIL <0.00 IU/mL   TB2-NIL <0.00 IU/mL    Comment: . The Nil tube value reflects the background interferon gamma immune response of the patient's blood sample. This value has been subtracted from the patient's displayed TB and Mitogen results. . Lower than expected results with the Mitogen tube prevent false-negative Quantiferon readings by detecting a patient with a potential immune suppressive condition and/or suboptimal pre-analytical specimen handling. . The TB1 Antigen tube is coated with the M. tuberculosis-specific antigens designed to elicit responses from TB antigen primed CD4+ helper T-lymphocytes. . The TB2 Antigen tube is coated with the M. tuberculosis-specific antigens designed to elicit responses from TB antigen primed CD4+ helper and CD8+ cytotoxic T-lymphocytes. . For additional information, please refer to http://education.questdiagnostics.com/faq/204 (This link is being provided for informational/ educational purposes only.) .   VITAMIN D 25 Hydroxy (Vit-D Deficiency, Fractures)     Status:  Abnormal   Collection Time: 07/22/17  2:50 PM  Result Value Ref Range   VITD 27.52 (L) 30.00 - 100.00 ng/mL  T4, free     Status: None   Collection Time: 07/22/17  2:50 PM  Result Value Ref Range   Free T4 0.77 0.60 - 1.60 ng/dL    Comment: Specimens from patients who are undergoing biotin therapy and /or ingesting biotin supplements may contain high levels of biotin.  The higher biotin concentration in these specimens interferes with this Free T4 assay.  Specimens that contain high levels  of  biotin may cause false high results for this Free T4 assay.  Please interpret results in light of the total clinical presentation of the patient.    T3, free     Status: None   Collection Time: 07/22/17  2:50 PM  Result Value Ref Range   T3, Free 3.3 2.3 - 4.2 pg/mL  Hepatitis C Antibody     Status: None   Collection Time: 07/22/17  2:50 PM  Result Value Ref Range   Hepatitis C Ab NON-REACTIVE NON-REACTI   SIGNAL TO CUT-OFF 0.01 <1.00  CBC     Status: Abnormal   Collection Time: 07/22/17  2:50 PM  Result Value Ref Range   WBC 2.8 (L) 4.0 - 10.5 K/uL   RBC 4.30 3.87 - 5.11 Mil/uL   Platelets 248.0 150.0 - 400.0 K/uL   Hemoglobin 12.1 12.0 - 15.0 g/dL   HCT 36.6 36.0 - 46.0 %   MCV 85.3 78.0 - 100.0 fl   MCHC 32.9 30.0 - 36.0 g/dL   RDW 15.2 11.5 - 15.5 %  Comprehensive metabolic panel     Status: Abnormal   Collection Time: 08/26/17  3:00 PM  Result Value Ref Range   Sodium 138 135 - 145 mmol/L   Potassium 4.2 3.5 - 5.1 mmol/L   Chloride 102 101 - 111 mmol/L   CO2 27 22 - 32 mmol/L   Glucose, Bld 101 (H) 65 - 99 mg/dL   BUN 18 6 - 20 mg/dL   Creatinine, Ser 0.93 0.44 - 1.00 mg/dL   Calcium 9.4 8.9 - 10.3 mg/dL   Total Protein 7.4 6.5 - 8.1 g/dL   Albumin 4.5 3.5 - 5.0 g/dL   AST 20 15 - 41 U/L   ALT 15 14 - 54 U/L   Alkaline Phosphatase 60 38 - 126 U/L   Total Bilirubin 0.5 0.3 - 1.2 mg/dL   GFR calc non Af Amer >60 >60 mL/min   GFR calc Af Amer >60 >60 mL/min    Comment:  (NOTE) The eGFR has been calculated using the CKD EPI equation. This calculation has not been validated in all clinical situations. eGFR's persistently <60 mL/min signify possible Chronic Kidney Disease.    Anion gap 9 5 - 15    Comment: Performed at [facility], [address] [place], [place] [address]  CBC with Differential     Status: Abnormal   Collection Time: 08/26/17  3:00 PM  Result Value Ref Range   WBC 3.1 (L) 3.6 - 11.0 K/uL   RBC 4.66 3.80 - 5.20 MIL/uL   Hemoglobin 13.2 12.0 - 16.0 g/dL   HCT 39.4 35.0 - 47.0 %   MCV 84.6 80.0 - 100.0 fL   MCH 28.3 26.0 - 34.0 pg   MCHC 33.4 32.0 - 36.0 g/dL   RDW 14.7 (H) 11.5 - 14.5 %   Platelets 246 150 - 440 K/uL   Neutrophils Relative % 63 %   Neutro Abs 2.0 1.4 - 6.5 K/uL   Lymphocytes Relative 22 %   Lymphs Abs 0.7 (L) 1.0 - 3.6 K/uL   Monocytes Relative 12 %   Monocytes Absolute 0.4 0.2 - 0.9 K/uL   Eosinophils Relative 2 %   Eosinophils Absolute 0.1 0 - 0.7 K/uL   Basophils Relative 1 %   Basophils Absolute 0.0 0 - 0.1 K/uL    Comment: Performed at [facility], [address] [place], [place] [address]   Objective  Body mass index is 23.57  kg/m. Wt Readings from Last 3 Encounters:  08/26/17 148 lb 11.2 oz (67.4 kg)  08/25/17 148 lb 4 oz (67.2 kg)  07/14/17 147 lb 6 oz (66.8 kg)   Temp Readings from Last 3 Encounters:  08/26/17 (!) 96.8 F (36 C) (Tympanic)  08/25/17 98.1 F (36.7 C) (Oral)  07/14/17 98.3 F (36.8 C) (Oral)   BP Readings from Last 3 Encounters:  08/26/17 130/72  08/25/17 140/72  07/14/17 126/70   Pulse Readings from Last 3 Encounters:  08/26/17 76  08/25/17 81  07/14/17 65   O2 sat room air 97%  Physical Exam  Constitutional: She is oriented to person, place, and time and well-developed, well-nourished, and in no distress. Vital signs are normal.  HENT:  Head: Normocephalic and atraumatic.  Mouth/Throat: Oropharynx is clear and moist and mucous membranes are  normal.  Eyes: Conjunctivae are normal. Pupils are equal, round, and reactive to light.  Cardiovascular: Normal rate, regular rhythm and normal heart sounds.  No murmur heard. Pulmonary/Chest: Effort normal and breath sounds normal.  Neurological: She is alert and oriented to person, place, and time. Gait normal.  Skin: Skin is warm, dry and intact.  Psychiatric: Mood, memory, affect and judgment normal.  Nursing note and vitals reviewed.   Assessment   1. Depression/anxiety 2. Low back pain with CT ab/pelvic + lumbar impingement L4/5, L5-S1, knee pain 3. Vit D def 27.52  4. Floaters in eye follows with eye MD  5. H/o CLL 08/2017 CT following with H/o  6. Thyroid nodules noted on CT neck stable from 2015 per CT rec thyroid US  7.HM  Plan  1.  Increase wellbutrin XL to 300 mg qd  2.  Rx mobic 7.5 mg bid prn  Consider MRI lumbar and referral to ortho spine in future  rec no heavy lifting 3. Vit D3 2000 IU rec OTC 4. F/u with eye MD  5.  Reviewed CT scans neck, chest, ab/pelvis 08/2017 f/u H/o 6.  rec thyroid US pt think about it  7.  Given flu shot today  Tdap had 07/14/17   Hep C neg, TB quant neg, rec hep B vaccine pt to consider   Need to check lipid in the future h/o HLD mammo 08/25/17 neg  Colonoscopy disc'ed today pt will call back when ready disc Dr. Allen Norris when ready  Following with OB/GYN for paps Dr. Enzo Bi saw 10/2016 for colp has fibroids, pap with h/o HPV + high risk per note colp was inadequate was rec she f/u in 6 months would have been due 04/2017 will rec she f/u      Provider: Dr. Olivia Mackie McLean-Scocuzza-Internal Medicine

## 2017-08-26 NOTE — Progress Notes (Signed)
Patient denies any concerns today.  

## 2017-08-28 ENCOUNTER — Telehealth: Payer: Self-pay

## 2017-08-28 NOTE — Telephone Encounter (Signed)
Left voice mail to call back ok for [facility] to speak to patient 

## 2017-08-28 NOTE — Telephone Encounter (Signed)
-----   Message from Delorise Jackson, MD sent at 08/26/2017  4:07 PM EST ----- Please call pt  1. rec she call OB/GYN Dr. Enzo Bi his last not was rec she f/u in 6 months from 10/2016 which would have been in 04/2017  2. Back pain reviewed CT scan +lumbar impingement or pinched nerve. He she having any sx's peeing on herself? Bowel movements on herself?, leg weakness? Or numbness/tingling down her legs?  -would consider MRI in future of low back and also if worsening referral to ortho spine in [place] Dr. Lynann Bologna -what does she think?  [place]

## 2017-09-04 NOTE — Telephone Encounter (Signed)
Left voice mail to call back ok for [facility] to speak to patient 

## 2017-09-11 NOTE — Telephone Encounter (Signed)
Unread mychart message mailed to patient 

## 2017-10-26 ENCOUNTER — Ambulatory Visit: Payer: 59 | Admitting: Internal Medicine

## 2017-12-08 ENCOUNTER — Ambulatory Visit: Payer: 59 | Admitting: Internal Medicine

## 2017-12-08 ENCOUNTER — Encounter: Payer: Self-pay | Admitting: Internal Medicine

## 2017-12-08 VITALS — BP 118/58 | HR 84 | Temp 98.1°F | Ht 66.5 in | Wt 147.4 lb

## 2017-12-08 DIAGNOSIS — M549 Dorsalgia, unspecified: Secondary | ICD-10-CM | POA: Diagnosis not present

## 2017-12-08 DIAGNOSIS — F341 Dysthymic disorder: Secondary | ICD-10-CM | POA: Diagnosis not present

## 2017-12-08 DIAGNOSIS — L570 Actinic keratosis: Secondary | ICD-10-CM

## 2017-12-08 DIAGNOSIS — M25511 Pain in right shoulder: Secondary | ICD-10-CM | POA: Diagnosis not present

## 2017-12-08 MED ORDER — BUPROPION HCL ER (XL) 150 MG PO TB24: 150.0000 mg | ORAL_TABLET | Freq: Every day | ORAL | 0 refills | Status: DC

## 2017-12-08 MED ORDER — BUPROPION HCL 75 MG PO TABS: ORAL_TABLET | ORAL | 0 refills | Status: DC

## 2017-12-08 MED ORDER — MELOXICAM 7.5 MG PO TABS: 7.5000 mg | ORAL_TABLET | Freq: Every day | ORAL | 0 refills | Status: DC | PRN

## 2017-12-08 NOTE — Progress Notes (Signed)
No chief complaint on file.  F/u  1. C/o left lateral thigh pain x years before tx for CLL wakes her up at night pain is burning 2. Mood cant tell wellbutrin is helping wants to taper off  3. C/o right shoulder pain h/o bursitis on left f/u Dr. Raliegh Ip he gave her steroids ? In past which helped, chronic low back pain DDD noted CT ab/pelvis wants refill of mobic taking 7.5 qd prn  4. Reviewed vaccines for age she declines hep B vaccine, agreeable to shingrix.   Review of Systems  Constitutional: Negative for weight loss.  HENT: Negative for hearing loss.   Eyes: Negative for blurred vision.  Respiratory: Negative for shortness of breath.   Cardiovascular: Negative for chest pain.  Gastrointestinal: Negative for abdominal pain.  Musculoskeletal: Positive for back pain.  Skin: Negative for rash.  Neurological: Positive for sensory change.  Psychiatric/Behavioral: Negative for depression.   Past Medical History:  Diagnosis Date  . Anxiety   . Chest pain   . Chicken pox   . CLL (chronic lymphocytic leukemia) ([place])    dx'ed 2015   . Complication of anesthesia    woke up with severe cramps in extremities (26 years ago)  . DUB (dysfunctional uterine bleeding)    s/p D&C Dr. Enzo Bi   . Dysthymia   . Hyperlipidemia   . Influenza    history of x 2   . Personal history of chemotherapy   . Splenomegaly    hx of   Past Surgical History:  Procedure Laterality Date  . HYSTEROSCOPY W/D&C N/A 06/02/2016   Procedure: DILATATION AND CURETTAGE /HYSTEROSCOPY 2/2 DUB;  Surgeon: Brayton Mars, MD;  Location: [facility] ORS;  Service: Gynecology;  Laterality: N/A;  . TUBAL LIGATION     Family History  Problem Relation Age of Onset  . Stroke Mother 12       CVA  . Arthritis Mother   . Deep vein thrombosis Brother 45       recurrent DVTs.  . Stroke Maternal Grandmother   . Colon cancer Maternal Grandmother   . Cancer Maternal Grandmother        colon cancer dx'ed 58s   . Stroke Maternal  Grandfather   . Diabetes Maternal Grandfather   . Diabetes Sister   . Breast cancer Neg Hx   . Ovarian cancer Neg Hx    Social History   Socioeconomic History  . Marital status: Divorced    Spouse name: Not on file  . Number of children: Not on file  . Years of education: Not on file  . Highest education level: Not on file  Occupational History  . Not on file  Social Needs  . Financial resource strain: Not on file  . Food insecurity:    Worry: Not on file    Inability: Not on file  . Transportation needs:    Medical: Not on file    Non-medical: Not on file  Tobacco Use  . Smoking status: Never Smoker  . Smokeless tobacco: Never Used  Substance and Sexual Activity  . Alcohol use: Yes    Comment: occasionally   . Drug use: No  . Sexual activity: Not Currently    Birth control/protection: Surgical  Lifestyle  . Physical activity:    Days per week: Not on file    Minutes per session: Not on file  . Stress: Not on file  Relationships  . Social connections:    Talks on phone: Not on  file    Gets together: Not on file    Attends religious service: Not on file    Active member of club or organization: Not on file    Attends meetings of clubs or organizations: Not on file    Relationship status: Not on file  . Intimate partner violence:    Fear of current or ex partner: Not on file    Emotionally abused: Not on file    Physically abused: Not on file    Forced sexual activity: Not on file  Other Topics Concern  . Not on file  Social History Narrative   B.S    Works at [employer]    3 kids (2 girls and 1 boy) and grandchildren    No outpatient medications have been marked as taking for the 12/08/17 encounter (Appointment) with McLean-Scocuzza, Nino Glow, MD.   No Known Allergies No results found for this or any previous visit (from the past 2160 hour(s)). Objective  There is no height or weight on file to calculate BMI. Wt Readings from Last 3 Encounters:    08/26/17 148 lb 11.2 oz (67.4 kg)  08/25/17 148 lb 4 oz (67.2 kg)  07/14/17 147 lb 6 oz (66.8 kg)   Temp Readings from Last 3 Encounters:  08/26/17 (!) 96.8 F (36 C) (Tympanic)  08/25/17 98.1 F (36.7 C) (Oral)  07/14/17 98.3 F (36.8 C) (Oral)   BP Readings from Last 3 Encounters:  08/26/17 130/72  08/25/17 140/72  07/14/17 126/70   Pulse Readings from Last 3 Encounters:  08/26/17 76  08/25/17 81  07/14/17 65    Physical Exam  Constitutional: She is oriented to person, place, and time. Vital signs are normal. She appears well-developed and well-nourished. She is cooperative.  HENT:  Head: Normocephalic and atraumatic.  Mouth/Throat: Oropharynx is clear and moist and mucous membranes are normal.  Eyes: Pupils are equal, round, and reactive to light. Conjunctivae are normal.  Cardiovascular: Normal rate, regular rhythm and normal heart sounds.  Pulmonary/Chest: Effort normal and breath sounds normal.  Neurological: She is alert and oriented to person, place, and time. Gait normal.  Skin: Skin is warm, dry and intact.     Psychiatric: She has a normal mood and affect. Her speech is normal and behavior is normal. Judgment and thought content normal. Cognition and memory are normal.  Nursing note and vitals reviewed.   Assessment   1. Left lateral thigh pain ddx neropathy, meralgia perathestica coming from DDD in back  2. Mood improved  3. Right shoulder pain  4. AK left eyebrow 5. HM Plan   1. Does not want to try meds ie.gabapentin for now  No MRI lumbar for now or PT  2. Taper off Wellbutrin  3. F/u Dr. Raliegh Ip [facility] if continues to bother  4. Pt wants to hold on derm referral  5.  UTD flu and Tdap  Given Rx shingrix today  Declines hep B vaccine for now Disc MMR titer and lipid check will see if h/o will do at labs 02/2018   Hep C neg, TB quant neg, rec hep B vaccine pt declines   Need to check lipid in the future h/o HLD mammo 08/25/17 neg  Colonoscopy  disc'ed today pt will call back when ready disc Dr. Allen Norris when ready  -addressed again today 4/23 pt declines for now  rec f/u with OB/GYN for paps Dr. Enzo Bi saw 10/2016 for colp has fibroids, pap with h/o HPV + high  risk per note colp was inadequate was rec she f/u in 6 months would have been due 04/2017  -rec she f/u again and will make appt for her  Pt wants to hold derm referral for now     Provider: Dr. Olivia Mackie McLean-Scocuzza-[facility]

## 2017-12-08 NOTE — Progress Notes (Signed)
Pre visit review using our clinic review tool, if applicable. No additional management support is needed unless otherwise documented below in the visit note. 

## 2017-12-08 NOTE — Patient Instructions (Addendum)
Take 150 XL wellbutrin x 1 week then 75 daily x 1 week then 75 mg every other day then stop   Think about Shingrix vaccine   I will see you in 6 months  Take care  Follow up with Dr. Enzo Bi  If your shoulder continues to bother you Dr. Raliegh Ip [facility]  Consider colonoscopy and dermatology referrals    Earwax Buildup, Adult The ears produce a substance called earwax that helps keep bacteria out of the ear and protects the skin in the ear canal. Occasionally, earwax can build up in the ear and cause discomfort or hearing loss. What increases the risk? This condition is more likely to develop in people who:  Are female.  Are elderly.  Naturally produce more earwax.  Clean their ears often with cotton swabs.  Use earplugs often.  Use in-ear headphones often.  Wear hearing aids.  Have narrow ear canals.  Have earwax that is overly thick or sticky.  Have eczema.  Are dehydrated.  Have excess hair in the ear canal.  What are the signs or symptoms? Symptoms of this condition include:  Reduced or muffled hearing.  A feeling of fullness in the ear or feeling that the ear is plugged.  Fluid coming from the ear.  Ear pain.  Ear itch.  Ringing in the ear.  Coughing.  An obvious piece of earwax that can be seen inside the ear canal.  How is this diagnosed? This condition may be diagnosed based on:  Your symptoms.  Your medical history.  An ear exam. During the exam, your health care provider will look into your ear with an instrument called an otoscope.  You may have tests, including a hearing test. How is this treated? This condition may be treated by:  Using ear drops to soften the earwax.  Having the earwax removed by a health care provider. The health care provider may: ? Flush the ear with water. ? Use an instrument that has a loop on the end (curette). ? Use a suction device.  Surgery to remove the wax buildup. This may be done in severe  cases.  Follow these instructions at home:  Take over-the-counter and prescription medicines only as told by your health care provider.  Do not put any objects, including cotton swabs, into your ear. You can clean the opening of your ear canal with a washcloth or facial tissue.  Follow instructions from your health care provider about cleaning your ears. Do not over-clean your ears.  Drink enough fluid to keep your urine clear or pale yellow. This will help to thin the earwax.  Keep all follow-up visits as told by your health care provider. If earwax builds up in your ears often or if you use hearing aids, consider seeing your health care provider for routine, preventive ear cleanings. Ask your health care provider how often you should schedule your cleanings.  If you have hearing aids, clean them according to instructions from the manufacturer and your health care provider. Contact a health care provider if:  You have ear pain.  You develop a fever.  You have blood, pus, or other fluid coming from your ear.  You have hearing loss.  You have ringing in your ears that does not go away.  Your symptoms do not improve with treatment.  You feel like the room is spinning (vertigo). Summary  Earwax can build up in the ear and cause discomfort or hearing loss.  The most common symptoms of  this condition include reduced or muffled hearing and a feeling of fullness in the ear or feeling that the ear is plugged.  This condition may be diagnosed based on your symptoms, your medical history, and an ear exam.  This condition may be treated by using ear drops to soften the earwax or by having the earwax removed by a health care provider.  Do not put any objects, including cotton swabs, into your ear. You can clean the opening of your ear canal with a washcloth or facial tissue. This information is not intended to replace advice given to you by your health care provider. Make sure you  discuss any questions you have with your health care provider. Document Released: 09/11/2004 Document Revised: 10/15/2016 Document Reviewed: 10/15/2016 Elsevier Interactive Patient Education  2018 [place] Peroxide ear solution What is this medicine? CARBAMIDE PEROXIDE (CAR bah mide per OX ide) is used to soften and help remove ear wax. This medicine may be used for other purposes; ask your health care provider or pharmacist if you have questions. COMMON BRAND NAME(S): Auro Ear, Auro Earache Relief, Debrox, Ear Drops, Ear Wax Removal, Ear Wax Remover, Earwax Treatment, Murine, Thera-Ear What should I tell my health care provider before I take this medicine? They need to know if you have any of these conditions: -dizziness -ear discharge -ear pain, irritation or rash -infection -perforated eardrum (hole in eardrum) -an unusual or allergic reaction to carbamide peroxide, glycerin, hydrogen peroxide, other medicines, foods, dyes, or preservatives -pregnant or trying to get pregnant -breast-feeding How should I use this medicine? This medicine is only for use in the outer ear canal. Follow the directions carefully. Wash hands before and after use. The solution may be warmed by holding the bottle in the hand for 1 to 2 minutes. Lie with the affected ear facing upward. Place the proper number of drops into the ear canal. After the drops are instilled, remain lying with the affected ear upward for 5 minutes to help the drops stay in the ear canal. A cotton ball may be gently inserted at the ear opening for no longer than 5 to 10 minutes to ensure retention. Repeat, if necessary, for the opposite ear. Do not touch the tip of the dropper to the ear, fingertips, or other surface. Do not rinse the dropper after use. Keep container tightly closed. Talk to your pediatrician regarding the use of this medicine in children. While this drug may be used in children as young as 12 years for  selected conditions, precautions do apply. Overdosage: If you think you have taken too much of this medicine contact a poison control center or emergency room at once. NOTE: This medicine is only for you. Do not share this medicine with others. What if I miss a dose? If you miss a dose, use it as soon as you can. If it is almost time for your next dose, use only that dose. Do not use double or extra doses. What may interact with this medicine? Interactions are not expected. Do not use any other ear products without asking your doctor or health care professional. This list may not describe all possible interactions. Give your health care provider a list of all the medicines, herbs, non-prescription drugs, or dietary supplements you use. Also tell them if you smoke, drink alcohol, or use illegal drugs. Some items may interact with your medicine. What should I watch for while using this medicine? This medicine is not for long-term use. Do  not use for more than 4 days without checking with your health care professional. Contact your doctor or health care professional if your condition does not start to get better within a few days or if you notice burning, redness, itching or swelling. What side effects may I notice from receiving this medicine? Side effects that you should report to your doctor or health care professional as soon as possible: -allergic reactions like skin rash, itching or hives, swelling of the face, lips, or tongue -burning, itching, and redness -worsening ear pain -rash Side effects that usually do not require medical attention (report to your doctor or health care professional if they continue or are bothersome): -abnormal sensation while putting the drops in the ear -temporary reduction in hearing (but not complete loss of hearing) This list may not describe all possible side effects. Call your doctor for medical advice about side effects. You may report side effects to FDA at  1-800-FDA-1088. Where should I keep my medicine? Keep out of the reach of children. Store at room temperature between 15 and 30 degrees C (59 and 86 degrees F) in a tight, light-resistant container. Keep bottle away from excessive heat and direct sunlight. Throw away any unused medicine after the expiration date. NOTE: This sheet is a summary. It may not cover all possible information. If you have questions about this medicine, talk to your doctor, pharmacist, or health care provider.  2018 Elsevier/Gold Standard (2007-11-16 14:00:02)   Colonoscopy, Adult A colonoscopy is an exam to look at the entire large intestine. During the exam, a lubricated, bendable tube is inserted into the anus and then passed into the rectum, colon, and other parts of the large intestine. A colonoscopy is often done as a part of normal colorectal screening or in response to certain symptoms, such as anemia, persistent diarrhea, abdominal pain, and blood in the stool. The exam can help screen for and diagnose medical problems, including:  Tumors.  Polyps.  Inflammation.  Areas of bleeding.  Tell a health care provider about:  Any allergies you have.  All medicines you are taking, including vitamins, herbs, eye drops, creams, and over-the-counter medicines.  Any problems you or family members have had with anesthetic medicines.  Any blood disorders you have.  Any surgeries you have had.  Any medical conditions you have.  Any problems you have had passing stool. What are the risks? Generally, this is a safe procedure. However, problems may occur, including:  Bleeding.  A tear in the intestine.  A reaction to medicines given during the exam.  Infection (rare).  What happens before the procedure? Eating and drinking restrictions Follow instructions from your health care provider about eating and drinking, which may include:  A few days before the procedure - follow a low-fiber diet. Avoid  nuts, seeds, dried fruit, raw fruits, and vegetables.  1-3 days before the procedure - follow a clear liquid diet. Drink only clear liquids, such as clear broth or bouillon, black coffee or tea, clear juice, clear soft drinks or sports drinks, gelatin dessert, and popsicles. Avoid any liquids that contain red or purple dye.  On the day of the procedure - do not eat or drink anything during the 2 hours before the procedure, or within the time period that your health care provider recommends.  Bowel prep If you were prescribed an oral bowel prep to clean out your colon:  Take it as told by your health care provider. Starting the day before your procedure, you  will need to drink a large amount of medicated liquid. The liquid will cause you to have multiple loose stools until your stool is almost clear or light green.  If your skin or anus gets irritated from diarrhea, you may use these to relieve the irritation: ? Medicated wipes, such as adult wet wipes with aloe and vitamin E. ? A skin soothing-product like petroleum jelly.  If you vomit while drinking the bowel prep, take a break for up to 60 minutes and then begin the bowel prep again. If vomiting continues and you cannot take the bowel prep without vomiting, call your health care provider.  General instructions  Ask your health care provider about changing or stopping your regular medicines. This is especially important if you are taking diabetes medicines or blood thinners.  Plan to have someone take you home from the hospital or clinic. What happens during the procedure?  An IV tube may be inserted into one of your veins.  You will be given medicine to help you relax (sedative).  To reduce your risk of infection: ? Your health care team will wash or sanitize their hands. ? Your anal area will be washed with soap.  You will be asked to lie on your side with your knees bent.  Your health care provider will lubricate a long, thin,  flexible tube. The tube will have a camera and a light on the end.  The tube will be inserted into your anus.  The tube will be gently eased through your rectum and colon.  Air will be delivered into your colon to keep it open. You may feel some pressure or cramping.  The camera will be used to take images during the procedure.  A small tissue sample may be removed from your body to be examined under a microscope (biopsy). If any potential problems are found, the tissue will be sent to a lab for testing.  If small polyps are found, your health care provider may remove them and have them checked for cancer cells.  The tube that was inserted into your anus will be slowly removed. The procedure may vary among health care providers and hospitals. What happens after the procedure?  Your blood pressure, heart rate, breathing rate, and blood oxygen level will be monitored until the medicines you were given have worn off.  Do not drive for 24 hours after the exam.  You may have a small amount of blood in your stool.  You may pass gas and have mild abdominal cramping or bloating due to the air that was used to inflate your colon during the exam.  It is up to you to get the results of your procedure. Ask your health care provider, or the department performing the procedure, when your results will be ready. This information is not intended to replace advice given to you by your health care provider. Make sure you discuss any questions you have with your health care provider. Document Released: 08/01/2000 Document Revised: 06/04/2016 Document Reviewed: 10/16/2015 Elsevier Interactive Patient Education  2018 [employer].  Hepatitis B Vaccine, Recombinant injection What is this medicine? HEPATITIS B VACCINE (hep uh TAHY tis B VAK seen) is a vaccine. It is used to prevent an infection with the hepatitis B virus. This medicine may be used for other purposes; ask your health care provider or  pharmacist if you have questions. COMMON BRAND NAME(S): Engerix-B, Recombivax HB What should I tell my health care provider before I take this medicine?  They need to know if you have any of these conditions: -fever, infection -heart disease -hepatitis B infection -immune system problems -kidney disease -an unusual or allergic reaction to vaccines, yeast, other medicines, foods, dyes, or preservatives -pregnant or trying to get pregnant -breast-feeding How should I use this medicine? This vaccine is for injection into a muscle. It is given by a health care professional. A copy of Vaccine Information Statements will be given before each vaccination. Read this sheet carefully each time. The sheet may change frequently. Talk to your pediatrician regarding the use of this medicine in children. While this drug may be prescribed for children as young as newborn for selected conditions, precautions do apply. Overdosage: If you think you have taken too much of this medicine contact a poison control center or emergency room at once. NOTE: This medicine is only for you. Do not share this medicine with others. What if I miss a dose? It is important not to miss your dose. Call your doctor or health care professional if you are unable to keep an appointment. What may interact with this medicine? -medicines that suppress your immune function like adalimumab, anakinra, infliximab -medicines to treat cancer -steroid medicines like prednisone or cortisone This list may not describe all possible interactions. Give your health care provider a list of all the medicines, herbs, non-prescription drugs, or dietary supplements you use. Also tell them if you smoke, drink alcohol, or use illegal drugs. Some items may interact with your medicine. What should I watch for while using this medicine? See your health care provider for all shots of this vaccine as directed. You must have 3 shots of this vaccine for  protection from hepatitis B infection. Tell your doctor right away if you have any serious or unusual side effects after getting this vaccine. What side effects may I notice from receiving this medicine? Side effects that you should report to your doctor or health care professional as soon as possible: -allergic reactions like skin rash, itching or hives, swelling of the face, lips, or tongue -breathing problems -confused, irritated -fast, irregular heartbeat -flu-like syndrome -numb, tingling pain -seizures -unusually weak or tired Side effects that usually do not require medical attention (report to your doctor or health care professional if they continue or are bothersome): -diarrhea -fever -headache -loss of appetite -muscle pain -nausea -pain, redness, swelling, or irritation at site where injected -tiredness This list may not describe all possible side effects. Call your doctor for medical advice about side effects. You may report side effects to FDA at 1-800-FDA-1088. Where should I keep my medicine? This drug is given in a hospital or clinic and will not be stored at home. NOTE: This sheet is a summary. It may not cover all possible information. If you have questions about this medicine, talk to your doctor, pharmacist, or health care provider.  2018 Elsevier/Gold Standard (2013-12-05 13:26:01)

## 2017-12-09 ENCOUNTER — Other Ambulatory Visit: Payer: Self-pay | Admitting: *Deleted

## 2017-12-09 DIAGNOSIS — C9110 Chronic lymphocytic leukemia of B-cell type not having achieved remission: Secondary | ICD-10-CM

## 2017-12-09 DIAGNOSIS — Z0000 Encounter for general adult medical examination without abnormal findings: Secondary | ICD-10-CM

## 2017-12-09 DIAGNOSIS — E785 Hyperlipidemia, unspecified: Secondary | ICD-10-CM

## 2018-01-08 ENCOUNTER — Encounter (INDEPENDENT_AMBULATORY_CARE_PROVIDER_SITE_OTHER): Payer: Self-pay

## 2018-01-11 ENCOUNTER — Encounter: Payer: Self-pay | Admitting: Family Medicine

## 2018-02-07 ENCOUNTER — Encounter: Payer: Self-pay | Admitting: Obstetrics and Gynecology

## 2018-02-10 ENCOUNTER — Encounter: Payer: 59 | Admitting: Obstetrics and Gynecology

## 2018-02-28 NOTE — Progress Notes (Signed)
[place]  Telephone:(336) [phone] Fax:(336) [phone]  ID: Cathy [name] OB: 06/04/1956  MR#: [id number]  RJJ#:[id number]  Patient Care Team: McLean-Scocuzza, Nino Glow, MD as PCP - General (Internal Medicine)  CHIEF COMPLAINT:  CLL, Rai stage 1  INTERVAL HISTORY: Patient returns to clinic today for repeat laboratory work and further evaluation.  She continues to feel well and remains asymptomatic.  She does not complain of any weakness or fatigue today.  She remains active and continues to work full-time. She denies any fevers, night sweats, or weight loss. She has no neurologic complaints.  She denies any chest pain, shortness of breath, cough.  She has no nausea, vomiting, constipation, or diarrhea.  She has no urinary complaints.  Patient feels at her baseline offers no specific complaints today.    REVIEW OF SYSTEMS:   Review of Systems  Constitutional: Negative.  Negative for diaphoresis, fever, malaise/fatigue and weight loss.  Respiratory: Negative.  Negative for cough and shortness of breath.   Cardiovascular: Negative for chest pain and leg swelling.  Gastrointestinal: Negative for abdominal pain, constipation and nausea.  Genitourinary: Negative.  Negative for dysuria.  Musculoskeletal: Negative.   Skin: Negative.  Negative for rash.  Neurological: Negative.  Negative for sensory change, weakness and headaches.  Psychiatric/Behavioral: Negative.  The patient is not nervous/anxious.     As per HPI. Otherwise, a complete review of systems is negative.  PAST MEDICAL HISTORY: Past Medical History:  Diagnosis Date  . Anxiety   . Chest pain   . Chicken pox   . CLL (chronic lymphocytic leukemia) ([place])    dx'ed 2015   . Complication of anesthesia    woke up with severe cramps in extremities (26 years ago)  . DUB (dysfunctional uterine bleeding)    s/p D&C Dr. Enzo Bi   . Dysthymia   . Hyperlipidemia   . Influenza    history of x 2   . Personal  history of chemotherapy   . Splenomegaly    hx of  . Thyroid nodule     PAST SURGICAL HISTORY: Past Surgical History:  Procedure Laterality Date  . HYSTEROSCOPY W/D&C N/A 06/02/2016   Procedure: DILATATION AND CURETTAGE /HYSTEROSCOPY 2/2 DUB;  Surgeon: Brayton Mars, MD;  Location: [facility] ORS;  Service: Gynecology;  Laterality: N/A;  . TUBAL LIGATION      FAMILY HISTORY Family History  Problem Relation Age of Onset  . Stroke Mother 64       CVA  . Arthritis Mother   . Deep vein thrombosis Brother 45       recurrent DVTs.  . Stroke Maternal Grandmother   . Colon cancer Maternal Grandmother   . Cancer Maternal Grandmother        colon cancer dx'ed 41s   . Stroke Maternal Grandfather   . Diabetes Maternal Grandfather   . Diabetes Sister   . Breast cancer Neg Hx   . Ovarian cancer Neg Hx        ADVANCED DIRECTIVES:    HEALTH MAINTENANCE: Social History   Tobacco Use  . Smoking status: Never Smoker  . Smokeless tobacco: Never Used  Substance Use Topics  . Alcohol use: Yes    Comment: occasionally   . Drug use: No     No Known Allergies  Current Outpatient Medications  Medication Sig Dispense Refill  . acyclovir (ZOVIRAX) 400 MG tablet Take 1 tablet (400 mg total) by mouth daily. (Patient taking differently: Take 400 mg by mouth daily  as needed. ) 30 tablet 3  . b complex vitamins capsule Take 1 capsule by mouth daily.    . Cholecalciferol (VITAMIN D3) 5000 units TABS Take by mouth daily.    . meloxicam (MOBIC) 7.5 MG tablet Take 1 tablet (7.5 mg total) by mouth daily as needed for pain. 90 tablet 0  . buPROPion (WELLBUTRIN XL) 150 MG 24 hr tablet Take 1 tablet (150 mg total) by mouth daily. In am 7 tablet 0  . buPROPion (WELLBUTRIN) 75 MG tablet Week 2 daily x 1 week then every other day 14 tablet 0   No current facility-administered medications for this visit.     OBJECTIVE: Vitals:   03/03/18 1522  BP: 117/78  Pulse: 74  Resp: 18  Temp: (!) 96  F (35.6 C)     Body mass index is 23.53 kg/m.    ECOG FS:0 - Asymptomatic  General: Well-developed, well-nourished, no acute distress. Eyes: Pink conjunctiva, anicteric sclera. HEENT: Normocephalic, moist mucous membranes, clear oropharnyx.  Cervical lymphadenopathy resolved. Lungs: Clear to auscultation bilaterally. Heart: Regular rate and rhythm. No rubs, murmurs, or gallops. Abdomen: Soft, nontender, nondistended. No organomegaly noted, normoactive bowel sounds. Musculoskeletal: No edema, cyanosis, or clubbing. Neuro: Alert, answering all questions appropriately. Cranial nerves grossly intact. Skin: No rashes or petechiae noted. Psych: Normal affect. Lymphatics: No cervical, calvicular, axillary or inguinal LAD.   LAB RESULTS:  Lab Results  Component Value Date   NA 137 03/03/2018   K 3.7 03/03/2018   CL 106 03/03/2018   CO2 24 03/03/2018   GLUCOSE 89 03/03/2018   BUN 20 03/03/2018   CREATININE 0.89 03/03/2018   CALCIUM 9.1 03/03/2018   PROT 6.6 03/03/2018   ALBUMIN 4.2 03/03/2018   AST 17 03/03/2018   ALT 15 03/03/2018   ALKPHOS 64 03/03/2018   BILITOT 0.7 03/03/2018   GFRNONAA >60 03/03/2018   GFRAA >60 03/03/2018    Lab Results  Component Value Date   WBC 4.7 03/03/2018   NEUTROABS 2.6 03/03/2018   HGB 12.6 03/03/2018   HCT 37.5 03/03/2018   MCV 89.3 03/03/2018   PLT 253 03/03/2018     STUDIES: No results found.  ASSESSMENT: CLL, Rai stage 1  PLAN:    1.  CLL: Peripheral blood flow cytometry confirmed the results. Patient completed her second round of 4 cycles of weekly Rituxan on Jan 16, 2016. Patient could not tolerate Imbruvica. She completed cycle 3 of dose reduced Treanda and Rituxan on April 28, 2017. CT results from May 22, 2017 reviewed independently with an excellent response to treatment. No further intervention is needed at this time.  Patient has requested no further imaging unless there is suspicion of progressive disease.  Return  to clinic in 3 months with repeat laboratory work only and then 6 months with repeat laboratory work and further evaluation. 2.  Leukopenia: Resolved.  I spent a total of 20 minutes face-to-face with the patient of which greater than 50% of the visit was spent in counseling and coordination of care as detailed above.    Patient expressed understanding and was in agreement with this plan. She also understands that She can call clinic at any time with any questions, concerns, or complaints.    Lloyd Huger, MD   03/08/2018 12:55 PM

## 2018-03-02 ENCOUNTER — Other Ambulatory Visit: Payer: Self-pay | Admitting: *Deleted

## 2018-03-02 DIAGNOSIS — C9110 Chronic lymphocytic leukemia of B-cell type not having achieved remission: Secondary | ICD-10-CM

## 2018-03-02 NOTE — Progress Notes (Signed)
cbc

## 2018-03-03 ENCOUNTER — Inpatient Hospital Stay (HOSPITAL_BASED_OUTPATIENT_CLINIC_OR_DEPARTMENT_OTHER): Payer: 59 | Admitting: Oncology

## 2018-03-03 ENCOUNTER — Inpatient Hospital Stay: Payer: 59 | Attending: Oncology

## 2018-03-03 ENCOUNTER — Encounter: Payer: Self-pay | Admitting: Oncology

## 2018-03-03 VITALS — BP 117/78 | HR 74 | Temp 96.0°F | Resp 18 | Ht 66.5 in | Wt 148.0 lb

## 2018-03-03 DIAGNOSIS — E785 Hyperlipidemia, unspecified: Secondary | ICD-10-CM | POA: Insufficient documentation

## 2018-03-03 DIAGNOSIS — C9110 Chronic lymphocytic leukemia of B-cell type not having achieved remission: Secondary | ICD-10-CM

## 2018-03-03 DIAGNOSIS — Z800 Family history of malignant neoplasm of digestive organs: Secondary | ICD-10-CM | POA: Diagnosis not present

## 2018-03-03 DIAGNOSIS — Z79899 Other long term (current) drug therapy: Secondary | ICD-10-CM

## 2018-03-03 DIAGNOSIS — Z9221 Personal history of antineoplastic chemotherapy: Secondary | ICD-10-CM

## 2018-03-03 DIAGNOSIS — M549 Dorsalgia, unspecified: Secondary | ICD-10-CM

## 2018-03-03 DIAGNOSIS — Z0000 Encounter for general adult medical examination without abnormal findings: Secondary | ICD-10-CM

## 2018-03-03 LAB — COMPREHENSIVE METABOLIC PANEL
ALBUMIN: 4.2 g/dL (ref 3.5–5.0)
ALT: 15 U/L (ref 0–44)
AST: 17 U/L (ref 15–41)
Alkaline Phosphatase: 64 U/L (ref 38–126)
Anion gap: 7 (ref 5–15)
BUN: 20 mg/dL (ref 6–20)
CO2: 24 mmol/L (ref 22–32)
Calcium: 9.1 mg/dL (ref 8.9–10.3)
Chloride: 106 mmol/L (ref 98–111)
Creatinine, Ser: 0.89 mg/dL (ref 0.44–1.00)
GFR calc Af Amer: >60 mL/min (ref >60)
GLUCOSE: 89 mg/dL (ref 70–99)
POTASSIUM: 3.7 mmol/L (ref 3.5–5.1)
SODIUM: 137 mmol/L (ref 135–145)
Total Bilirubin: 0.7 mg/dL (ref 0.3–1.2)
Total Protein: 6.6 g/dL (ref 6.5–8.1)

## 2018-03-03 LAB — CBC WITH DIFFERENTIAL/PLATELET
Basophils Absolute: 0.1 10*3/uL (ref 0–0.1)
Basophils Relative: 2 %
EOS ABS: 0.1 10*3/uL (ref 0–0.7)
EOS PCT: 2 %
HCT: 37.5 % (ref 35.0–47.0)
Hemoglobin: 12.6 g/dL (ref 12.0–16.0)
LYMPHS ABS: 1.6 10*3/uL (ref 1.0–3.6)
LYMPHS PCT: 34 %
MCH: 29.9 pg (ref 26.0–34.0)
MCHC: 33.5 g/dL (ref 32.0–36.0)
MCV: 89.3 fL (ref 80.0–100.0)
Monocytes Absolute: 0.4 10*3/uL (ref 0.2–0.9)
Monocytes Relative: 8 %
Neutro Abs: 2.6 10*3/uL (ref 1.4–6.5)
Neutrophils Relative %: 54 %
PLATELETS: 253 10*3/uL (ref 150–440)
RBC: 4.19 MIL/uL (ref 3.80–5.20)
RDW: 13.9 % (ref 11.5–14.5)
WBC: 4.7 10*3/uL (ref 3.6–11.0)

## 2018-03-03 LAB — LIPID PANEL
CHOLESTEROL: 254 mg/dL — AB (ref 0–200)
HDL: 76 mg/dL (ref >40)
LDL Cholesterol: 173 mg/dL — ABNORMAL HIGH (ref 0–99)
Total CHOL/HDL Ratio: 3.3 RATIO
Triglycerides: 27 mg/dL (ref <150)
VLDL: 5 mg/dL (ref 0–40)

## 2018-03-03 MED ORDER — MELOXICAM 7.5 MG PO TABS: 7.5000 mg | ORAL_TABLET | Freq: Every day | ORAL | 0 refills | Status: DC | PRN

## 2018-03-03 NOTE — Progress Notes (Signed)
No cll sx. She would like a refill mobic if poss. She gets it usually through PCP.

## 2018-03-04 LAB — MEASLES/MUMPS/RUBELLA IMMUNITY
Mumps IgG: >300 AU/mL (ref >10.9)
RUBELLA: 13.5 {index} (ref >0.99)

## 2018-03-09 ENCOUNTER — Other Ambulatory Visit: Payer: Self-pay | Admitting: Oncology

## 2018-03-09 DIAGNOSIS — M549 Dorsalgia, unspecified: Secondary | ICD-10-CM

## 2018-03-10 MED ORDER — MELOXICAM 7.5 MG PO TABS: 7.5000 mg | ORAL_TABLET | Freq: Every day | ORAL | 0 refills | Status: DC | PRN

## 2018-03-11 ENCOUNTER — Other Ambulatory Visit (HOSPITAL_COMMUNITY)
Admission: RE | Admit: 2018-03-11 | Discharge: 2018-03-11 | Disposition: A | Discharge status: Home / Self Care | Payer: 59 | Source: Ambulatory Visit | Attending: Obstetrics and Gynecology | Admitting: Obstetrics and Gynecology

## 2018-03-11 ENCOUNTER — Ambulatory Visit (INDEPENDENT_AMBULATORY_CARE_PROVIDER_SITE_OTHER): Payer: 59 | Admitting: Obstetrics and Gynecology

## 2018-03-11 VITALS — BP 129/77 | HR 76 | Ht 66.0 in | Wt 148.5 lb

## 2018-03-11 DIAGNOSIS — Z0000 Encounter for general adult medical examination without abnormal findings: Secondary | ICD-10-CM | POA: Insufficient documentation

## 2018-03-11 DIAGNOSIS — D259 Leiomyoma of uterus, unspecified: Secondary | ICD-10-CM

## 2018-03-11 DIAGNOSIS — R87810 Cervical high risk human papillomavirus (HPV) DNA test positive: Secondary | ICD-10-CM

## 2018-03-11 DIAGNOSIS — N870 Mild cervical dysplasia: Secondary | ICD-10-CM | POA: Diagnosis not present

## 2018-03-11 NOTE — Patient Instructions (Signed)
1.  Pap smear is done today. 2.  Return in 1 year for repeat Pap smear 3.  Monitor for any unusual pelvic symptoms including pelvic pain, abnormal bleeding, changes in bladder function or bowel function, all of which may be related to uterine fibroids.

## 2018-03-11 NOTE — Addendum Note (Signed)
Addended by: Edwyna Shell on: 03/11/2018 03:55 PM   Modules accepted: Orders

## 2018-03-11 NOTE — Addendum Note (Signed)
Addended by: Lin Landsman on: 03/11/2018 03:12 PM   Modules accepted: Orders

## 2018-03-11 NOTE — Progress Notes (Signed)
Pt has no concerns.  Chief complaint: 1.  History of high risk HPV on Pap smear 2.  Pap smear 3.  Uterine fibroids 4.  History of abnormal uterine bleeding  Abnormal Pap smear history: 04/30/2016 endometrial biopsy blood with rare endometrial fragments, insufficient for diagnosis 06/02/2016 D&C endometrial curettings-DIAGNOSIS:  A. ENDOMETRIAL CURETTINGS:  - ENDOMETRIAL POLYP.  - PROLIFERATIVE ENDOMETRIUM WITH TUBAL METAPLASIA.  - UNREMARKABLE STRIPS OF ENDOCERVICAL GLANDULAR AND SQUAMOUS EPITHELIUM.  - NEGATIVE FOR ATYPIA AND MALIGNANCY.  10/22/2016 Pap/HPV negative/positive 11/11/2016 colposcopic directed biopsies:  Cervix 12:00-CIN-1  Cervix 9:00-CIN-1   ECC-negative  03/11/2018 Pap/HPV  Patient presents for follow-up on history of cervical dysplasia and positive high-risk HPV on Pap smear.  She also has a history of uterine fibroids abnormal uterine bleeding that was evaluated and managed with hysteroscopy/D&C.  Patient is not experiencing any further abnormal uterine bleeding.  She reports no vaginal discharge or spotting. Patient reports normal bowel function and normal bladder function.  She is not experiencing any pelvic pain. Patient sees primary care for annual physicals.  Her last mammogram was in January 2019 and was BI-RADS 1.  Past Medical History:  Diagnosis Date  . Anxiety   . Chest pain   . Chicken pox   . CLL (chronic lymphocytic leukemia) ([place])    dx'ed 2015   . Complication of anesthesia    woke up with severe cramps in extremities (26 years ago)  . DUB (dysfunctional uterine bleeding)    s/p D&C Dr. Enzo Bi   . Dysthymia   . Hyperlipidemia   . Influenza    history of x 2   . Personal history of chemotherapy   . Splenomegaly    hx of  . Thyroid nodule    Past Surgical History:  Procedure Laterality Date  . HYSTEROSCOPY W/D&C N/A 06/02/2016   Procedure: DILATATION AND CURETTAGE /HYSTEROSCOPY 2/2 DUB;  Surgeon: Brayton Mars, MD;  Location:  [facility] ORS;  Service: Gynecology;  Laterality: N/A;  . TUBAL LIGATION     Review of systems: Complete review of systems is negative including that noted in the HPI  OBJECTIVE: BP 129/77   Pulse 76   Ht 5\' 6"  (1.676 m)   Wt 148 lb 8 oz (67.4 kg)   LMP 01/11/2017 (Approximate)   BMI 23.97 kg/m  Pleasant well-appearing female no acute distress.  Alert and oriented. Abdomen: Soft, nontender without organomegaly. Bladder: Nontender Back: No CVA tenderness Pelvic exam: External genitalia-normal BUS-normal Vagina-fair estrogen effect Cervix-no lesions; parous; no cervical motion tenderness; posterior cervical scarring persists (unchanged) Uterus-irregular with left-sided prominence and enlarged to 10 weeks size, mobile, nontender Adnexa-nonpalpable nontender Rectovaginal-normal external exam; rectum vaginal internal exam is deferred Extremities-warm and dry  ASSESSMENT: 1.  History of CIN-1 of the cervix 2.  History of high risk HPV of cervix 3.  Uterine fibroids, 10 weeks size, asymptomatic 4.  Prolonged interval for follow-up Pap smear  PLAN: 1.  Pap/HPV 2.  Return in 1 year for Pap smear 3.  Colposcopy will be performed if dysplasia is identified 4.  Precautions regarding fibroid symptoms were given; patient is to notify us if she develops abnormal uterine bleeding, change in bowel or bladder function, or pelvic pain.  A total of 15 minutes were spent face-to-face with the patient during this encounter and over half of that time dealt with counseling and coordination of care.  Brayton Mars, MD  Note: This dictation was prepared with Dragon dictation along with smaller phrase technology. Any  transcriptional errors that result from this process are unintentional.

## 2018-03-16 LAB — CYTOLOGY - PAP
DIAGNOSIS: UNDETERMINED — AB
HPV: NOT DETECTED

## 2018-04-06 ENCOUNTER — Other Ambulatory Visit: Payer: Self-pay | Admitting: Oncology

## 2018-04-06 DIAGNOSIS — M549 Dorsalgia, unspecified: Secondary | ICD-10-CM

## 2018-04-06 MED ORDER — MELOXICAM 7.5 MG PO TABS: 7.5000 mg | ORAL_TABLET | Freq: Every day | ORAL | 0 refills | Status: DC | PRN

## 2018-04-15 IMAGING — CT CT NECK W/ CM
4 of 5 series · 14 of 33 positions shown, 16 images · IV contrast (iopamidol)
Comparison: CT neck with contrast, 12/30/2016.

CLINICAL DATA: Follow-up scan for chronic lymphocytic leukemia,
nausea and fatigue from chemotherapy.

EXAM:
CT NECK WITH CONTRAST
TECHNIQUE: Multidetector CT imaging of the neck was performed using the
standard protocol following the bolus administration of intravenous
contrast.
CONTRAST:  100mL [id number] IOPAMIDOL ([id number]) INJECTION 61%

[Series 3: axial neck · axial · 0.56mm/px · z∈[-222,-162]mm · 2 of 120 slices shown]
[im 30/120  bone]
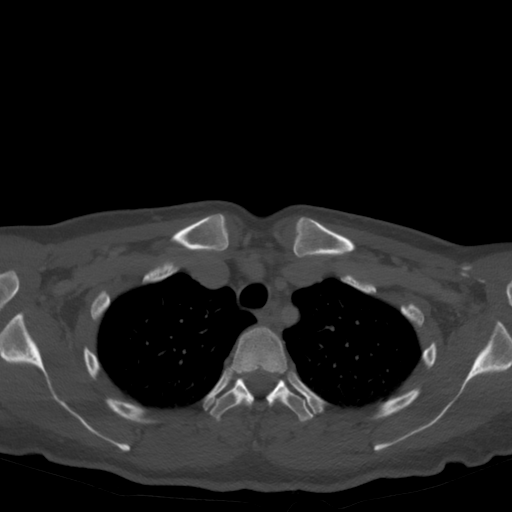
[im 60/120  bone]
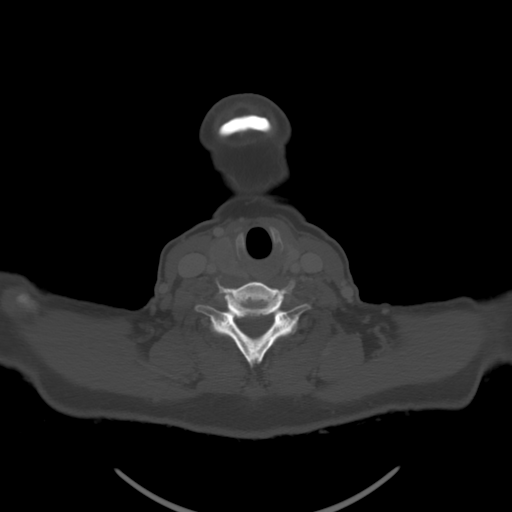

[Series 7: sag neck · sagittal · 0.54mm/px · 5 of 76 slices shown, 6 images]
[im 26/76  bone]
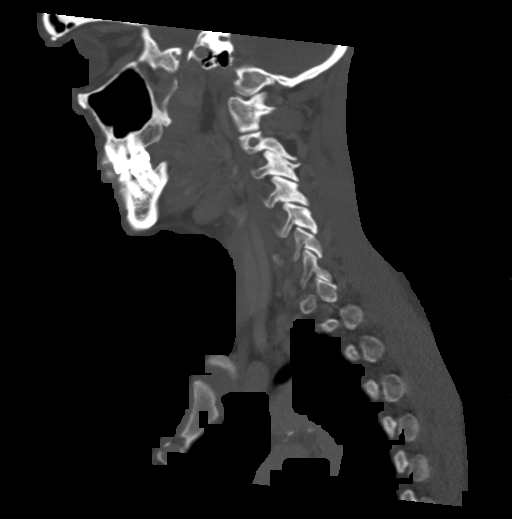
[im 32/76  bone]
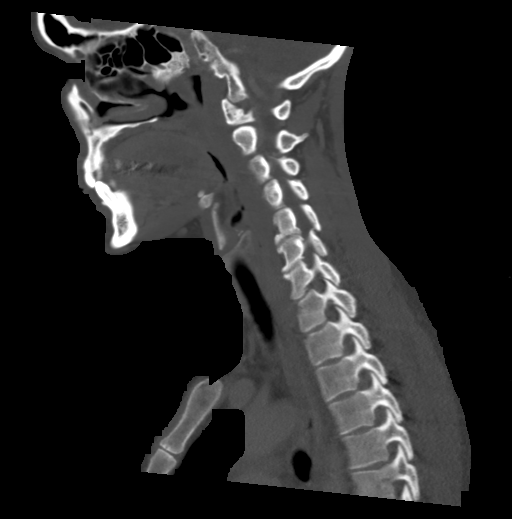
[im 38/76  soft-tissue]
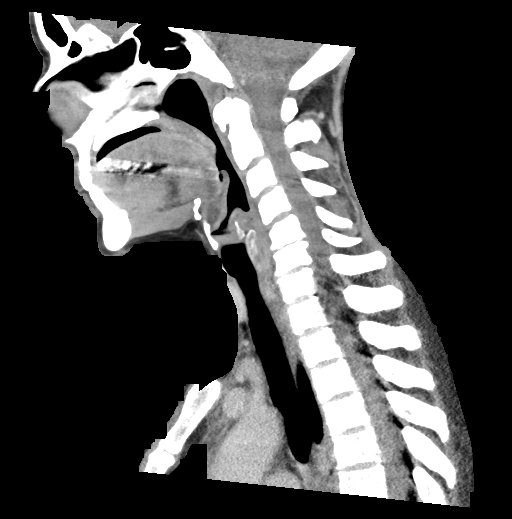
[im 38/76  bone]
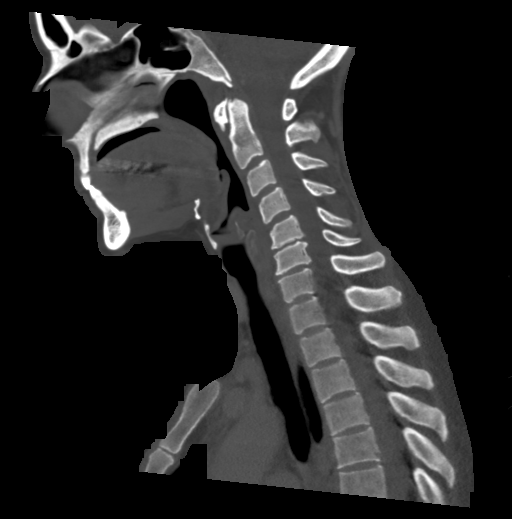
[im 44/76  bone]
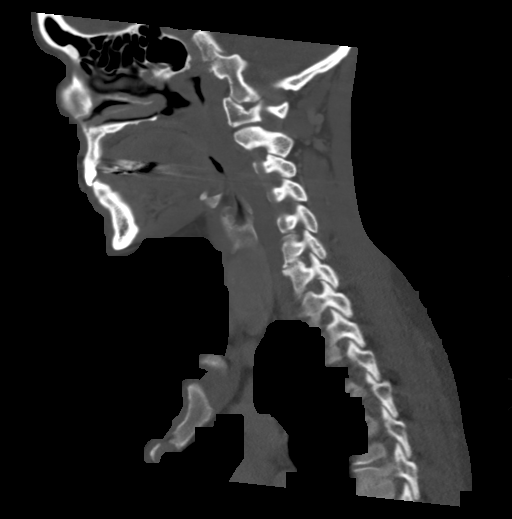
[im 51/76  bone]
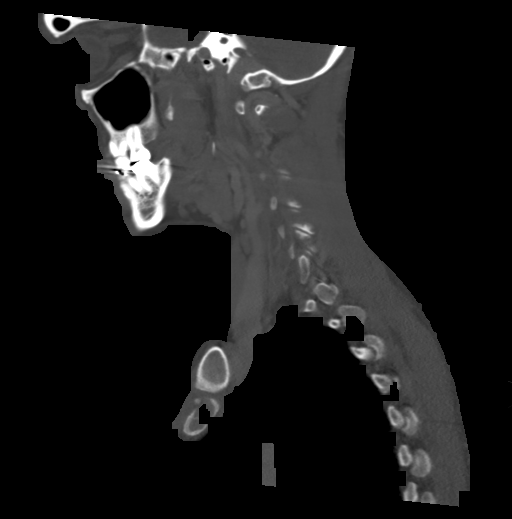

[Series 8: cor neck · coronal · 0.38mm/px · 3 of 119 slices shown]
[im 24/119  bone]
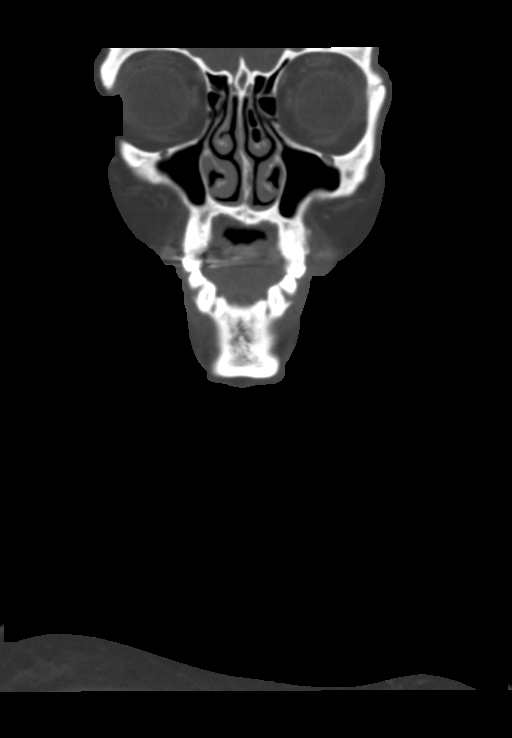
[im 48/119  bone]
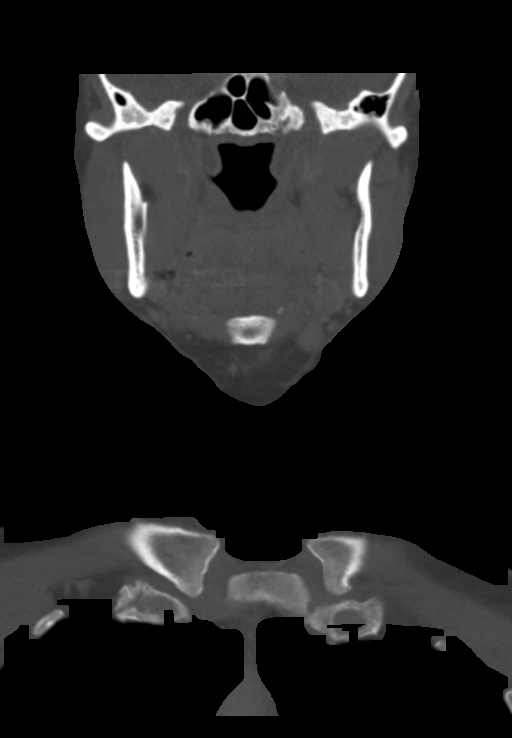
[im 71/119  bone]
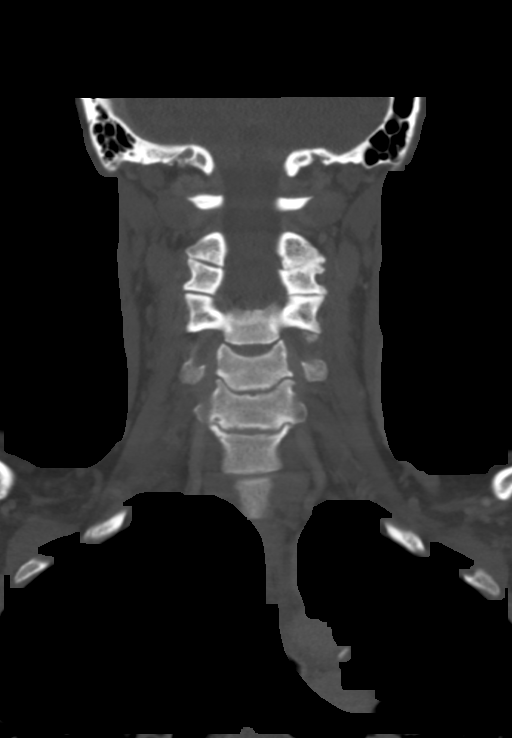

[Series 9: orthogonal ax · axial · 0.39mm/px · z∈[-279,-112]mm · 4 of 147 slices shown, 5 images]
[im 30/147  soft-tissue]
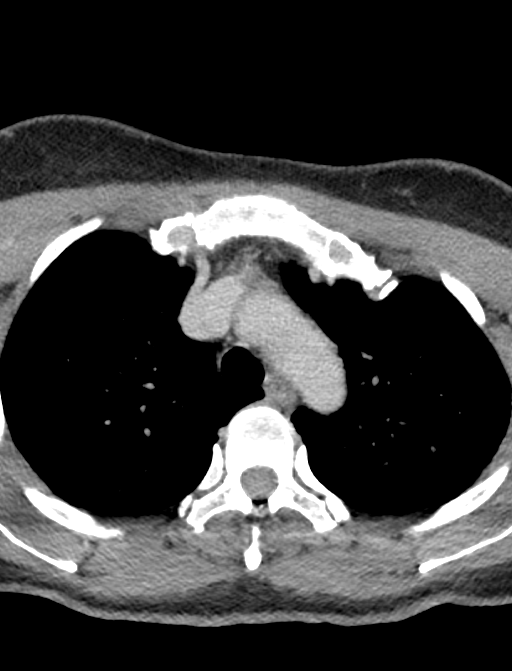
[im 30/147  bone]
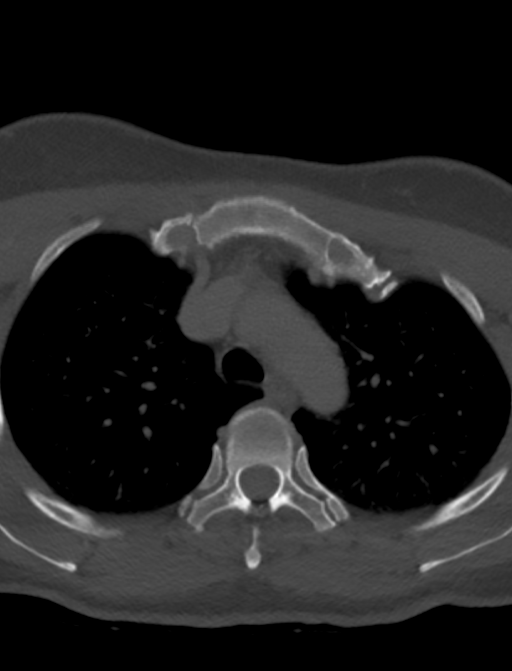
[im 59/147  bone]
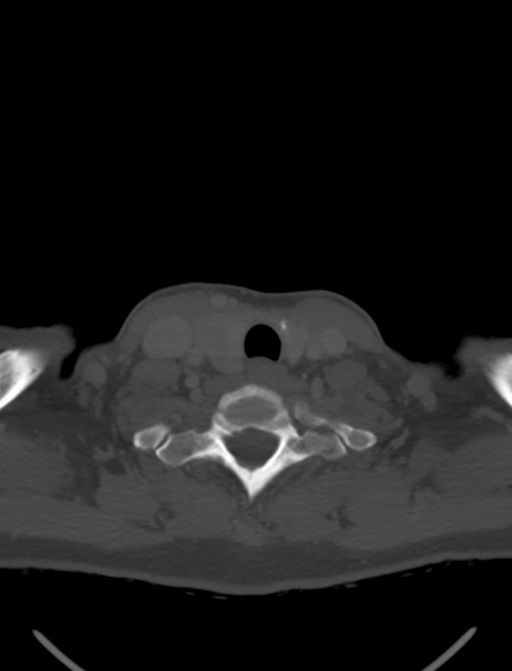
[im 88/147  bone]
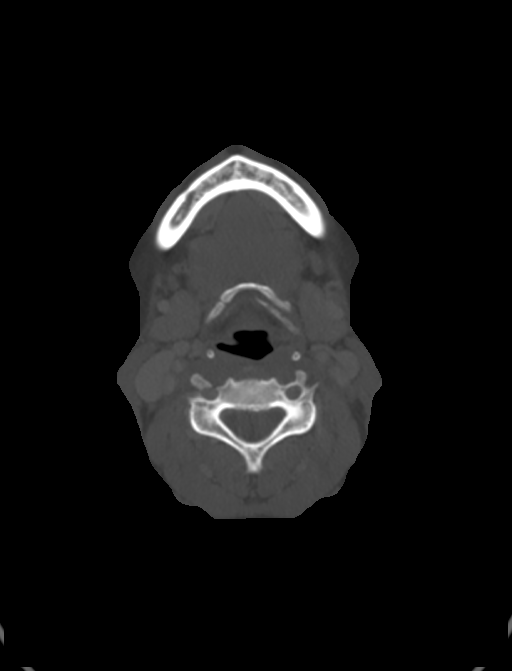
[im 117/147  bone]
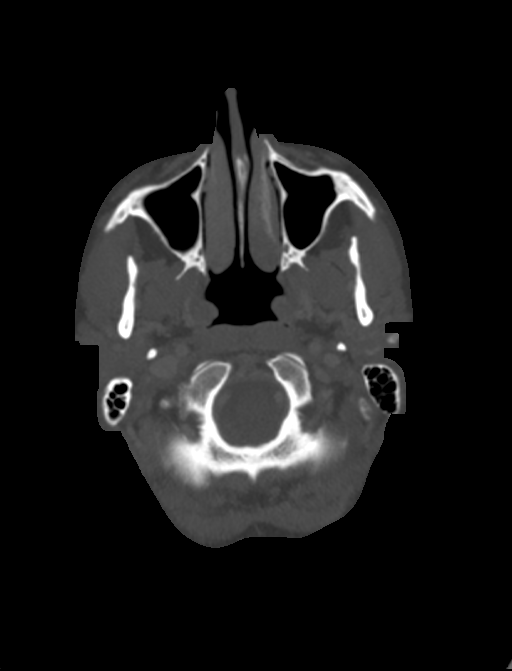

[14 of 33 positions shown; findings below may reference images not displayed]

FINDINGS: Pharynx and larynx: Normal. No mass or swelling.

Salivary glands: No inflammation, mass, or stone.

Thyroid: Redemonstrated is a chronic heterogeneous RIGHT thyroid
lobe nodule, small foci of calcification, stable in size with a long
axis of 22 mm. A chronic coarse calcification in the LEFT lobe is
stable.

Lymph nodes: Marked regression of lymphadenopathy at all stations
indicating response to treatment. See for instance index lymph node
LEFT level IB, series 3, image 48, now measures 4 mm short axis as
compared with 13 mm previous.

Vascular: Negative.

Limited intracranial: Negative.

Visualized orbits: Negative.

Mastoids and visualized paranasal sinuses: Clear.

Skeleton: No acute or aggressive process.

Upper chest: Reported separately.

Other: None.
IMPRESSION: Significant regression of adenopathy following treatment for CLL.
See discussion above.

Stable enlarged RIGHT thyroid nodule. Findings meet consensus
criteria for biopsy. Ultrasound-guided fine needle aspiration should
be considered, as per the consensus statement: Management of Thyroid
Nodules Detected at US: Society of [employer] in Ultrasound

## 2018-06-08 ENCOUNTER — Other Ambulatory Visit: Payer: Self-pay | Admitting: *Deleted

## 2018-06-08 DIAGNOSIS — C9110 Chronic lymphocytic leukemia of B-cell type not having achieved remission: Secondary | ICD-10-CM

## 2018-06-08 NOTE — Progress Notes (Signed)
cbc

## 2018-06-10 ENCOUNTER — Inpatient Hospital Stay: Payer: 59 | Attending: Oncology

## 2018-06-10 DIAGNOSIS — Z9221 Personal history of antineoplastic chemotherapy: Secondary | ICD-10-CM | POA: Insufficient documentation

## 2018-06-10 DIAGNOSIS — C9110 Chronic lymphocytic leukemia of B-cell type not having achieved remission: Secondary | ICD-10-CM | POA: Insufficient documentation

## 2018-06-10 LAB — CBC WITH DIFFERENTIAL/PLATELET
Abs Immature Granulocytes: 0.03 10*3/uL (ref 0.00–0.07)
Basophils Absolute: 0.1 10*3/uL (ref 0.0–0.1)
Basophils Relative: 1 %
EOS ABS: 0.1 10*3/uL (ref 0.0–0.5)
EOS PCT: 1 %
HEMATOCRIT: 38.7 % (ref 36.0–46.0)
HEMOGLOBIN: 12.7 g/dL (ref 12.0–15.0)
Immature Granulocytes: 0 %
LYMPHS ABS: 2.2 10*3/uL (ref 0.7–4.0)
LYMPHS PCT: 31 %
MCH: 28.9 pg (ref 26.0–34.0)
MCHC: 32.8 g/dL (ref 30.0–36.0)
MCV: 88 fL (ref 80.0–100.0)
MONOS PCT: 7 %
Monocytes Absolute: 0.5 10*3/uL (ref 0.1–1.0)
Neutro Abs: 4.2 10*3/uL (ref 1.7–7.7)
Neutrophils Relative %: 60 %
Platelets: 242 10*3/uL (ref 150–400)
RBC: 4.4 MIL/uL (ref 3.87–5.11)
RDW: 12.6 % (ref 11.5–15.5)
WBC: 7 10*3/uL (ref 4.0–10.5)
nRBC: 0 % (ref 0.0–0.2)

## 2018-06-10 LAB — COMPREHENSIVE METABOLIC PANEL
ALBUMIN: 4.4 g/dL (ref 3.5–5.0)
ALK PHOS: 60 U/L (ref 38–126)
ALT: 17 U/L (ref 0–44)
AST: 20 U/L (ref 15–41)
Anion gap: 6 (ref 5–15)
BUN: 18 mg/dL (ref 6–20)
CALCIUM: 9.5 mg/dL (ref 8.9–10.3)
CHLORIDE: 103 mmol/L (ref 98–111)
CO2: 28 mmol/L (ref 22–32)
CREATININE: 0.86 mg/dL (ref 0.44–1.00)
GFR calc Af Amer: >60 mL/min (ref >60)
GFR calc non Af Amer: >60 mL/min (ref >60)
GLUCOSE: 98 mg/dL (ref 70–99)
Potassium: 3.5 mmol/L (ref 3.5–5.1)
SODIUM: 137 mmol/L (ref 135–145)
Total Bilirubin: 0.6 mg/dL (ref 0.3–1.2)
Total Protein: 7.2 g/dL (ref 6.5–8.1)

## 2018-07-19 IMAGING — MG MM DIGITAL SCREENING BILAT W/ CAD
4 series · 4 of 4 positions shown · non-contrast
Comparison: None.

CLINICAL DATA: Screening.

EXAM:
DIGITAL SCREENING BILATERAL MAMMOGRAM WITH CAD

[L MLO]
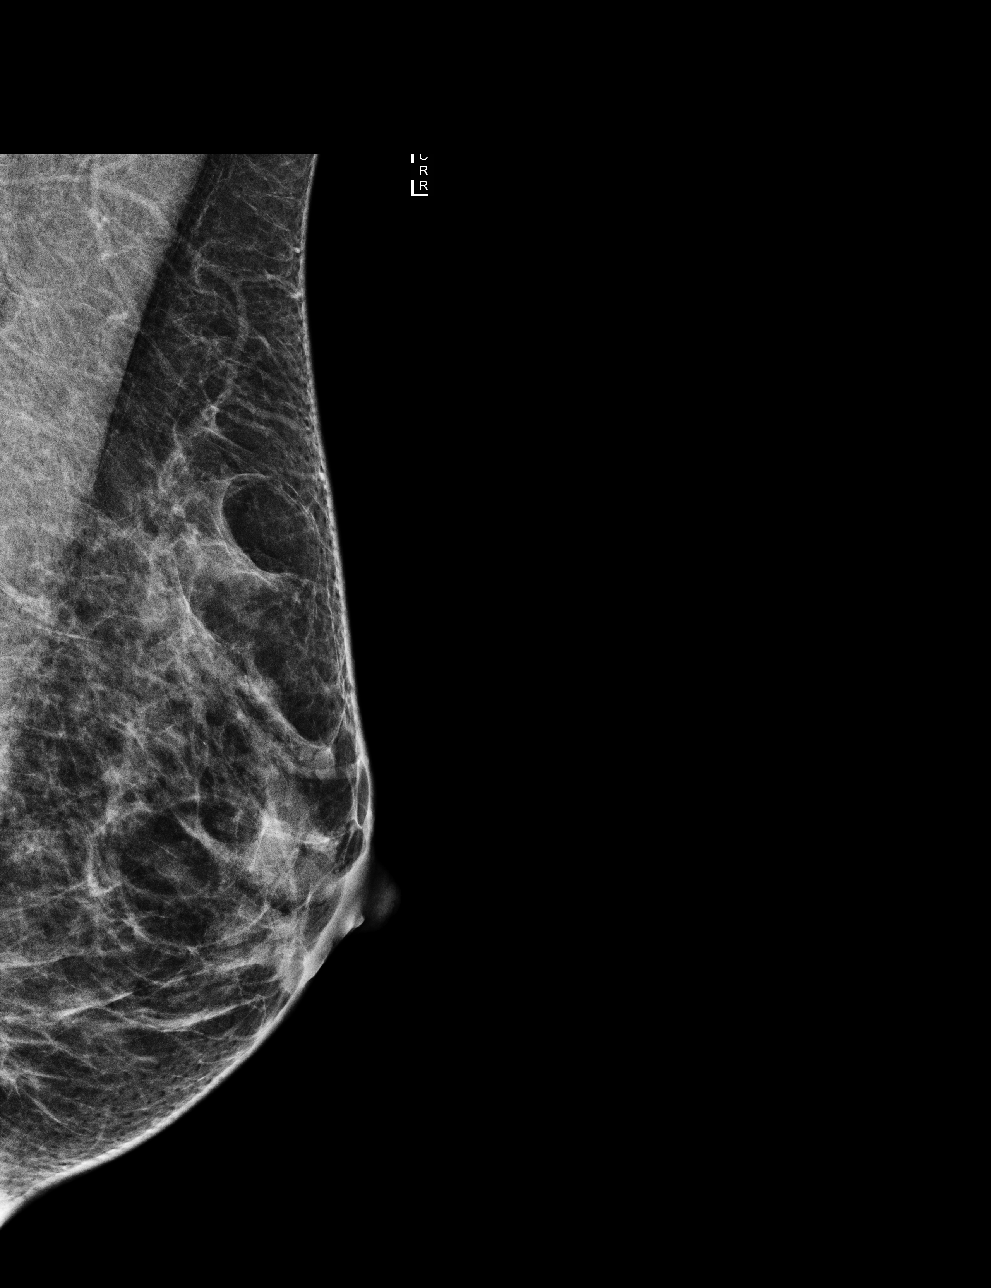

[L CC]
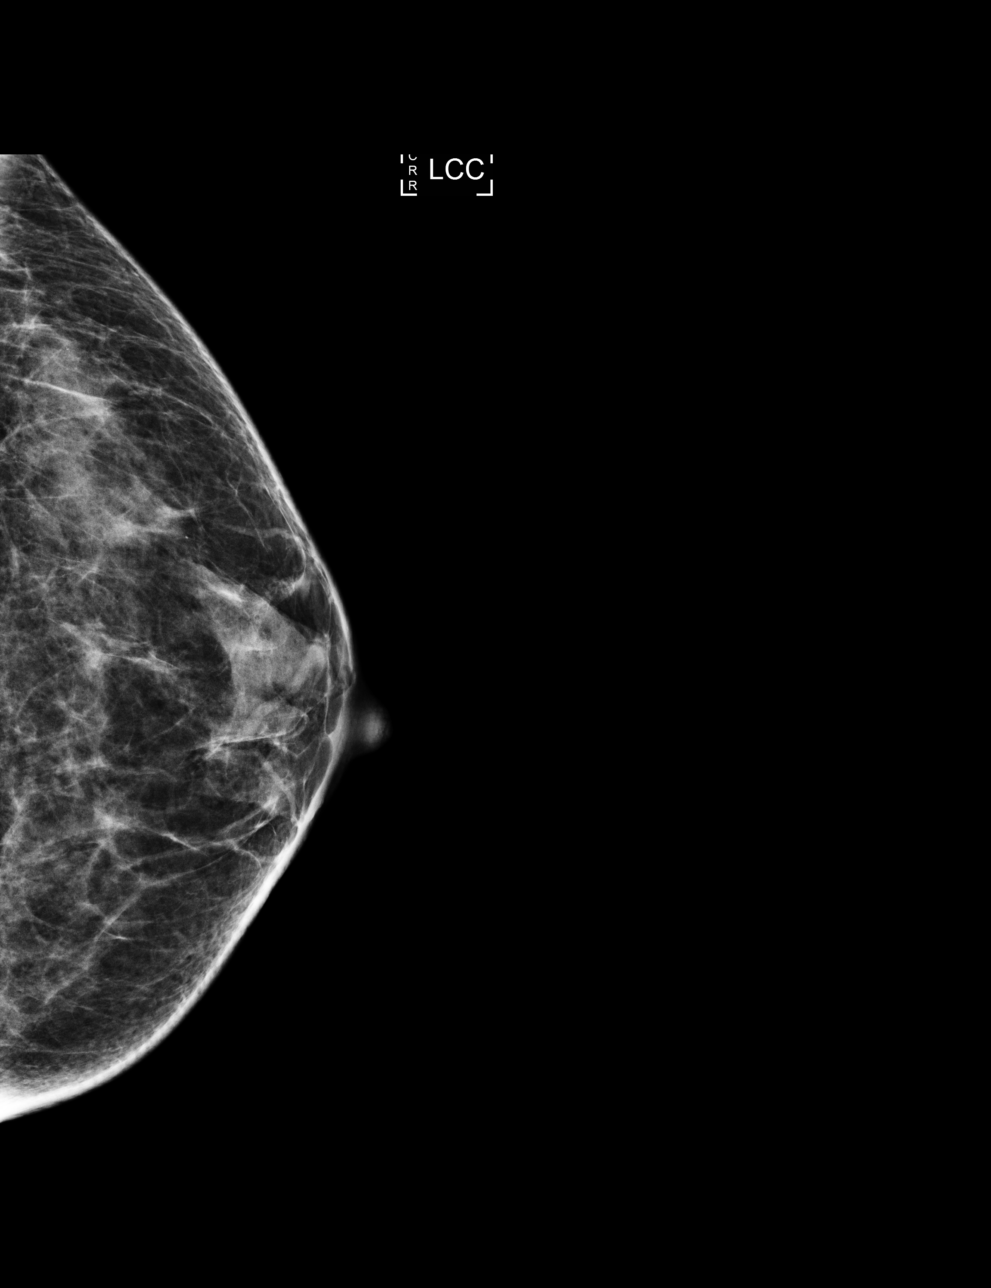

[R CC]
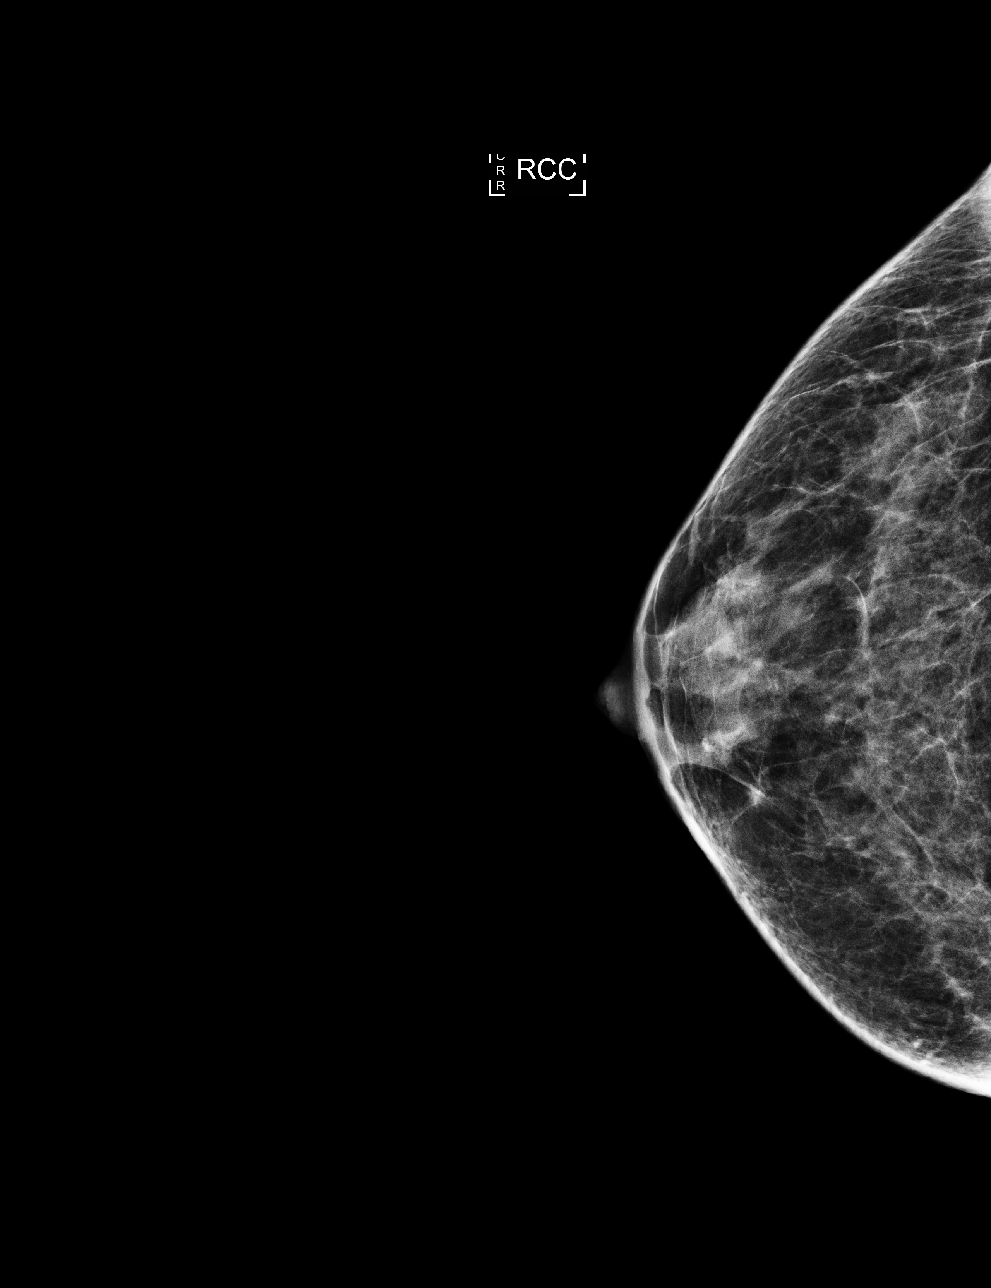

[R MLO]
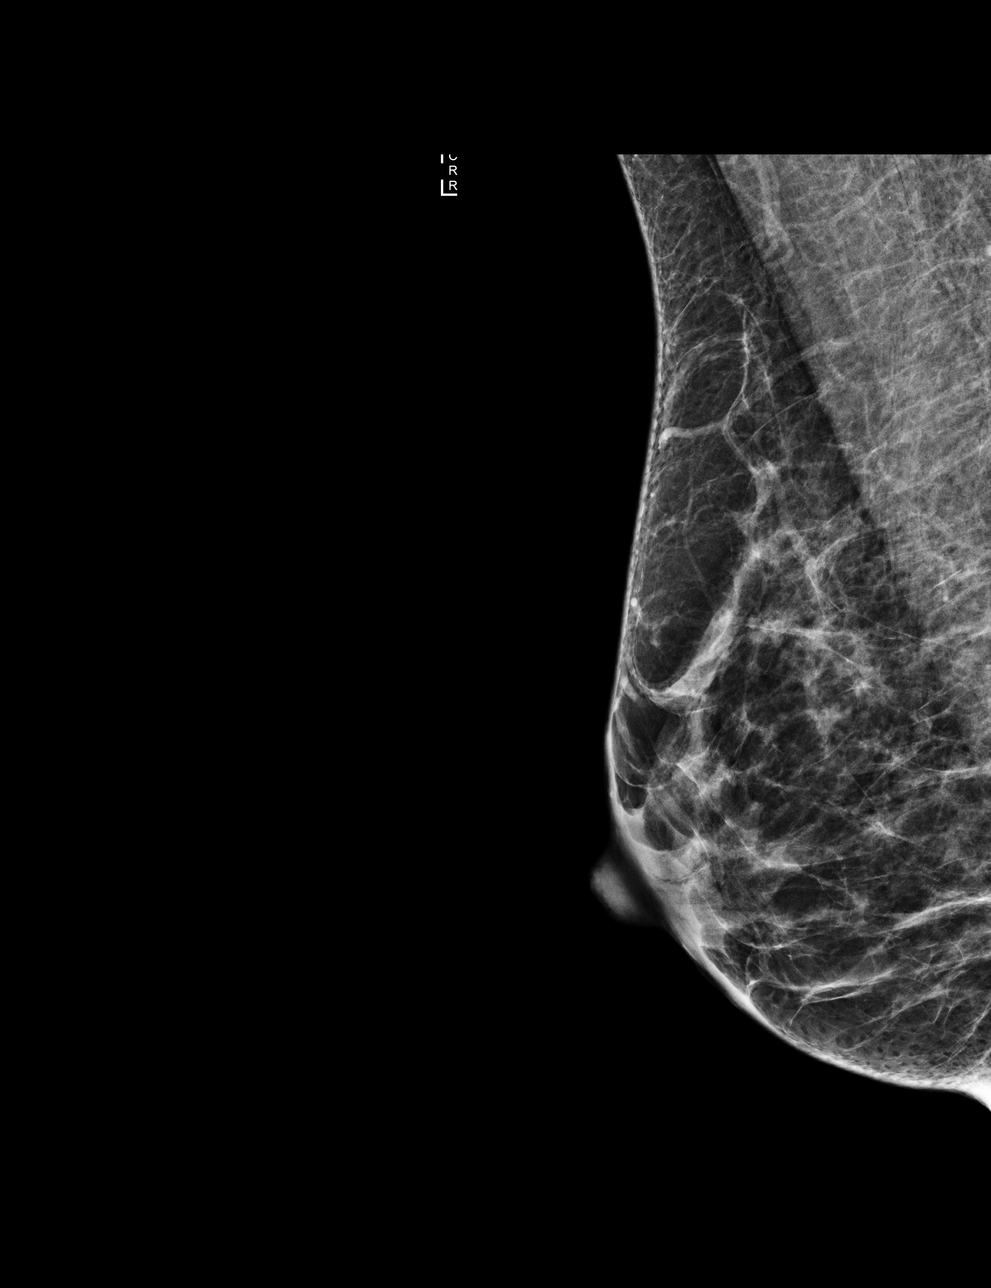

[4 of 4 positions shown; findings below may reference images not displayed]

ACR Breast Density Category c: The breast tissue is heterogeneously
dense, which may obscure small masses
FINDINGS: There are no findings suspicious for malignancy. Images were
processed with CAD.
IMPRESSION: No mammographic evidence of malignancy. A result letter of this
screening mammogram will be mailed directly to the patient.

RECOMMENDATION:
Screening mammogram in one year. (Code:U2-0-761)

BI-RADS CATEGORY  1: Negative.

## 2018-08-30 ENCOUNTER — Other Ambulatory Visit: Payer: 59

## 2018-08-30 ENCOUNTER — Ambulatory Visit: Payer: 59 | Admitting: Oncology

## 2018-09-01 ENCOUNTER — Ambulatory Visit: Payer: 59 | Admitting: Oncology

## 2018-09-01 ENCOUNTER — Other Ambulatory Visit: Payer: 59

## 2018-09-12 NOTE — Progress Notes (Signed)
[place]  Telephone:(336) [phone] Fax:(336) [phone]  ID: Cathy [name] OB: 12/06/1955  MR#: [id number]  QIO#:[id number]  Patient Care Team: McLean-Scocuzza, Nino Glow, MD as PCP - General (Internal Medicine)  CHIEF COMPLAINT:  CLL, Rai stage 1  INTERVAL HISTORY: Patient returns to clinic today for repeat laboratory work and routine 30-month follow-up.  She continues to feel well and remains asymptomatic. She does not complain of any weakness or fatigue today.  She remains active and continues to work full-time. She denies any fevers, night sweats, or weight loss. She has no neurologic complaints.  She denies any chest pain, shortness of breath, cough.  She has no nausea, vomiting, constipation, or diarrhea.  She has no urinary complaints.  Patient feels at her baseline offers no specific complaints today.  REVIEW OF SYSTEMS:   Review of Systems  Constitutional: Negative.  Negative for diaphoresis, fever, malaise/fatigue and weight loss.  Respiratory: Negative.  Negative for cough and shortness of breath.   Cardiovascular: Negative for chest pain and leg swelling.  Gastrointestinal: Negative for abdominal pain, constipation and nausea.  Genitourinary: Negative.  Negative for dysuria.  Musculoskeletal: Negative.   Skin: Negative.  Negative for rash.  Neurological: Negative.  Negative for sensory change, weakness and headaches.  Psychiatric/Behavioral: Negative.  The patient is not nervous/anxious.     As per HPI. Otherwise, a complete review of systems is negative.  PAST MEDICAL HISTORY: Past Medical History:  Diagnosis Date  . Anxiety   . Chest pain   . Chicken pox   . CLL (chronic lymphocytic leukemia) ([place])    dx'ed 2015   . Complication of anesthesia    woke up with severe cramps in extremities (26 years ago)  . DUB (dysfunctional uterine bleeding)    s/p D&C Dr. Enzo Bi   . Dysthymia   . Hyperlipidemia   . Influenza    history of x 2   .  Personal history of chemotherapy   . Splenomegaly    hx of  . Thyroid nodule     PAST SURGICAL HISTORY: Past Surgical History:  Procedure Laterality Date  . HYSTEROSCOPY W/D&C N/A 06/02/2016   Procedure: DILATATION AND CURETTAGE /HYSTEROSCOPY 2/2 DUB;  Surgeon: Brayton Mars, MD;  Location: [facility] ORS;  Service: Gynecology;  Laterality: N/A;  . TUBAL LIGATION      FAMILY HISTORY Family History  Problem Relation Age of Onset  . Stroke Mother 48       CVA  . Arthritis Mother   . Deep vein thrombosis Brother 45       recurrent DVTs.  . Stroke Maternal Grandmother   . Colon cancer Maternal Grandmother   . Cancer Maternal Grandmother        colon cancer dx'ed 71s   . Stroke Maternal Grandfather   . Diabetes Maternal Grandfather   . Diabetes Sister   . Breast cancer Neg Hx   . Ovarian cancer Neg Hx        ADVANCED DIRECTIVES:    HEALTH MAINTENANCE: Social History   Tobacco Use  . Smoking status: Never Smoker  . Smokeless tobacco: Never Used  Substance Use Topics  . Alcohol use: Yes    Comment: occasionally   . Drug use: No     No Known Allergies  Current Outpatient Medications  Medication Sig Dispense Refill  . allopurinol (ZYLOPRIM) 300 MG tablet Take 1 tablet by mouth 1 day or 1 dose.    . b complex vitamins capsule Take 1  capsule by mouth daily.    Marland Kitchen buPROPion (WELLBUTRIN XL) 150 MG 24 hr tablet Take 1 tablet (150 mg total) by mouth daily. In am 7 tablet 0  . buPROPion (WELLBUTRIN) 75 MG tablet Week 2 daily x 1 week then every other day 14 tablet 0  . cefUROXime (CEFTIN) 500 MG tablet cefuroxime axetil 500 mg tablet    . Cholecalciferol (VITAMIN D3) 5000 units TABS Take by mouth daily.    . meloxicam (MOBIC) 7.5 MG tablet Take 1 tablet (7.5 mg total) by mouth daily as needed for pain. 90 tablet 0  . ondansetron (ZOFRAN) 8 MG tablet Take 1 tablet by mouth 1 day or 1 dose.    Marland Kitchen acyclovir (ZOVIRAX) 400 MG tablet Take 1 tablet (400 mg total) by mouth daily.  30 tablet 3   No current facility-administered medications for this visit.     OBJECTIVE: Vitals:   09/15/18 1036  BP: 133/82  Pulse: 73  Temp: 97.8 F (36.6 C)     Body mass index is 24.42 kg/m.    ECOG FS:0 - Asymptomatic  General: Well-developed, well-nourished, no acute distress. Eyes: Pink conjunctiva, anicteric sclera. HEENT: Normocephalic, moist mucous membranes, clear oropharnyx.  No palpable lymphadenopathy. Lungs: Clear to auscultation bilaterally. Heart: Regular rate and rhythm. No rubs, murmurs, or gallops. Abdomen: Soft, nontender, nondistended. No organomegaly noted, normoactive bowel sounds. Musculoskeletal: No edema, cyanosis, or clubbing. Neuro: Alert, answering all questions appropriately. Cranial nerves grossly intact. Skin: No rashes or petechiae noted. Psych: Normal affect. Lymphatics: No cervical, calvicular, axillary or inguinal LAD.   LAB RESULTS:  Lab Results  Component Value Date   NA 142 09/15/2018   K 4.7 09/15/2018   CL 106 09/15/2018   CO2 29 09/15/2018   GLUCOSE 99 09/15/2018   BUN 19 09/15/2018   CREATININE 1.00 09/15/2018   CALCIUM 9.2 09/15/2018   PROT 7.1 09/15/2018   ALBUMIN 4.3 09/15/2018   AST 18 09/15/2018   ALT 16 09/15/2018   ALKPHOS 64 09/15/2018   BILITOT 0.7 09/15/2018   GFRNONAA >60 09/15/2018   GFRAA >60 09/15/2018    Lab Results  Component Value Date   WBC 10.9 (H) 09/15/2018   NEUTROABS 3.2 09/15/2018   HGB 13.4 09/15/2018   HCT 40.7 09/15/2018   MCV 87.9 09/15/2018   PLT 254 09/15/2018     STUDIES: No results found.  ASSESSMENT: CLL, Rai stage 1  PLAN:    1.  CLL: Peripheral blood flow cytometry confirmed the results. Patient completed her second round of 4 cycles of weekly Rituxan on Jan 16, 2016. Patient could not tolerate Imbruvica. She completed cycle 3 of dose reduced Treanda and Rituxan on April 28, 2017. CT results from May 22, 2017 reviewed independently with an excellent response to  treatment. No further intervention is needed at this time.  Patient has requested no further imaging unless there is suspicion of progressive disease.  Return to clinic in 3 months with repeat laboratory work only and then in 6 months with repeat laboratory work and further evaluation. 2.  Leukocytosis: Patient's white blood cell count is 10.9, monitor.  3.  Cold sores: Patient was given a prescription for acyclovir to use as needed.   Patient expressed understanding and was in agreement with this plan. She also understands that She can call clinic at any time with any questions, concerns, or complaints.    Lloyd Huger, MD   09/16/2018 6:51 AM

## 2018-09-14 ENCOUNTER — Other Ambulatory Visit: Payer: Self-pay | Admitting: *Deleted

## 2018-09-14 DIAGNOSIS — C9110 Chronic lymphocytic leukemia of B-cell type not having achieved remission: Secondary | ICD-10-CM

## 2018-09-15 ENCOUNTER — Inpatient Hospital Stay: Payer: BLUE CROSS/BLUE SHIELD | Attending: Oncology

## 2018-09-15 ENCOUNTER — Other Ambulatory Visit: Payer: Self-pay

## 2018-09-15 ENCOUNTER — Inpatient Hospital Stay (HOSPITAL_BASED_OUTPATIENT_CLINIC_OR_DEPARTMENT_OTHER): Payer: BLUE CROSS/BLUE SHIELD | Admitting: Oncology

## 2018-09-15 ENCOUNTER — Other Ambulatory Visit: Payer: Self-pay | Admitting: *Deleted

## 2018-09-15 VITALS — BP 133/82 | HR 73 | Temp 97.8°F | Ht 66.0 in | Wt 151.3 lb

## 2018-09-15 DIAGNOSIS — Z791 Long term (current) use of non-steroidal anti-inflammatories (NSAID): Secondary | ICD-10-CM | POA: Insufficient documentation

## 2018-09-15 DIAGNOSIS — Z79899 Other long term (current) drug therapy: Secondary | ICD-10-CM | POA: Diagnosis not present

## 2018-09-15 DIAGNOSIS — B001 Herpesviral vesicular dermatitis: Secondary | ICD-10-CM | POA: Insufficient documentation

## 2018-09-15 DIAGNOSIS — C9110 Chronic lymphocytic leukemia of B-cell type not having achieved remission: Secondary | ICD-10-CM | POA: Insufficient documentation

## 2018-09-15 DIAGNOSIS — Z800 Family history of malignant neoplasm of digestive organs: Secondary | ICD-10-CM | POA: Insufficient documentation

## 2018-09-15 DIAGNOSIS — E785 Hyperlipidemia, unspecified: Secondary | ICD-10-CM | POA: Diagnosis not present

## 2018-09-15 LAB — COMPREHENSIVE METABOLIC PANEL
ALK PHOS: 64 U/L (ref 38–126)
ALT: 16 U/L (ref 0–44)
AST: 18 U/L (ref 15–41)
Albumin: 4.3 g/dL (ref 3.5–5.0)
Anion gap: 7 (ref 5–15)
BUN: 19 mg/dL (ref 6–20)
CALCIUM: 9.2 mg/dL (ref 8.9–10.3)
CO2: 29 mmol/L (ref 22–32)
Chloride: 106 mmol/L (ref 98–111)
Creatinine, Ser: 1 mg/dL (ref 0.44–1.00)
Glucose, Bld: 99 mg/dL (ref 70–99)
Potassium: 4.7 mmol/L (ref 3.5–5.1)
Sodium: 142 mmol/L (ref 135–145)
Total Bilirubin: 0.7 mg/dL (ref 0.3–1.2)
Total Protein: 7.1 g/dL (ref 6.5–8.1)

## 2018-09-15 LAB — CBC WITH DIFFERENTIAL/PLATELET
ABS IMMATURE GRANULOCYTES: 0.01 10*3/uL (ref 0.00–0.07)
BASOS ABS: 0.1 10*3/uL (ref 0.0–0.1)
Basophils Relative: 1 %
EOS ABS: 0.1 10*3/uL (ref 0.0–0.5)
EOS PCT: 1 %
HEMATOCRIT: 40.7 % (ref 36.0–46.0)
Hemoglobin: 13.4 g/dL (ref 12.0–15.0)
Immature Granulocytes: 0 %
LYMPHS ABS: 6.1 10*3/uL — AB (ref 0.7–4.0)
Lymphocytes Relative: 57 %
MCH: 28.9 pg (ref 26.0–34.0)
MCHC: 32.9 g/dL (ref 30.0–36.0)
MCV: 87.9 fL (ref 80.0–100.0)
MONO ABS: 1.3 10*3/uL — AB (ref 0.1–1.0)
Monocytes Relative: 12 %
NRBC: 0 % (ref 0.0–0.2)
Neutro Abs: 3.2 10*3/uL (ref 1.7–7.7)
Neutrophils Relative %: 29 %
PLATELETS: 254 10*3/uL (ref 150–400)
RBC: 4.63 MIL/uL (ref 3.87–5.11)
RDW: 12.8 % (ref 11.5–15.5)
WBC: 10.9 10*3/uL — AB (ref 4.0–10.5)

## 2018-09-15 MED ORDER — ACYCLOVIR 400 MG PO TABS: 400.0000 mg | ORAL_TABLET | Freq: Every day | ORAL | 3 refills | Status: AC

## 2018-09-15 NOTE — Progress Notes (Signed)
Patient is here today to follow up on her CLL. Patient stated that she had been doing well. Patient is also would like a refill on her acyclovir.

## 2018-09-23 LAB — MISC LABCORP TEST (SEND OUT): LABCORP TEST CODE: 113753

## 2018-09-28 LAB — MISC LABCORP TEST (SEND OUT): Labcorp test code: 510340

## 2018-11-17 ENCOUNTER — Encounter: Payer: Self-pay | Admitting: Oncology

## 2018-11-18 ENCOUNTER — Encounter: Payer: Self-pay | Admitting: Oncology

## 2018-11-22 ENCOUNTER — Encounter: Payer: Self-pay | Admitting: *Deleted

## 2018-12-13 ENCOUNTER — Other Ambulatory Visit: Payer: Self-pay

## 2018-12-14 ENCOUNTER — Inpatient Hospital Stay: Payer: BLUE CROSS/BLUE SHIELD | Attending: Oncology

## 2018-12-14 ENCOUNTER — Encounter: Payer: Self-pay | Admitting: Oncology

## 2018-12-14 ENCOUNTER — Other Ambulatory Visit: Payer: Self-pay

## 2018-12-14 DIAGNOSIS — C9110 Chronic lymphocytic leukemia of B-cell type not having achieved remission: Secondary | ICD-10-CM | POA: Insufficient documentation

## 2018-12-14 LAB — COMPREHENSIVE METABOLIC PANEL
ALT: 15 U/L (ref 0–44)
AST: 17 U/L (ref 15–41)
Albumin: 4.1 g/dL (ref 3.5–5.0)
Alkaline Phosphatase: 66 U/L (ref 38–126)
Anion gap: 6 (ref 5–15)
BUN: 19 mg/dL (ref 6–20)
CO2: 28 mmol/L (ref 22–32)
Calcium: 8.9 mg/dL (ref 8.9–10.3)
Chloride: 107 mmol/L (ref 98–111)
Creatinine, Ser: 0.96 mg/dL (ref 0.44–1.00)
GFR calc Af Amer: >60 mL/min (ref >60)
GFR calc non Af Amer: >60 mL/min (ref >60)
Glucose, Bld: 148 mg/dL — ABNORMAL HIGH (ref 70–99)
Potassium: 3.6 mmol/L (ref 3.5–5.1)
Sodium: 141 mmol/L (ref 135–145)
Total Bilirubin: 0.4 mg/dL (ref 0.3–1.2)
Total Protein: 6.5 g/dL (ref 6.5–8.1)

## 2018-12-14 LAB — CBC WITH DIFFERENTIAL/PLATELET
Abs Immature Granulocytes: 0.02 10*3/uL (ref 0.00–0.07)
Basophils Absolute: 0.1 10*3/uL (ref 0.0–0.1)
Basophils Relative: 1 %
Eosinophils Absolute: 0.2 10*3/uL (ref 0.0–0.5)
Eosinophils Relative: 1 %
HCT: 37 % (ref 36.0–46.0)
Hemoglobin: 12.3 g/dL (ref 12.0–15.0)
Immature Granulocytes: 0 %
Lymphocytes Relative: 63 %
Lymphs Abs: 8.9 10*3/uL — ABNORMAL HIGH (ref 0.7–4.0)
MCH: 29.4 pg (ref 26.0–34.0)
MCHC: 33.2 g/dL (ref 30.0–36.0)
MCV: 88.5 fL (ref 80.0–100.0)
Monocytes Absolute: 1.5 10*3/uL — ABNORMAL HIGH (ref 0.1–1.0)
Monocytes Relative: 11 %
Neutro Abs: 3.4 10*3/uL (ref 1.7–7.7)
Neutrophils Relative %: 24 %
Platelets: 278 10*3/uL (ref 150–400)
RBC: 4.18 MIL/uL (ref 3.87–5.11)
RDW: 13.1 % (ref 11.5–15.5)
Smear Review: NORMAL
WBC: 14.1 10*3/uL — ABNORMAL HIGH (ref 4.0–10.5)
nRBC: 0 % (ref 0.0–0.2)

## 2019-03-26 NOTE — Progress Notes (Deleted)
Cathy [name]  [phone] Fax:(336) [phone]  ID: Cathy [name] OB: Sep 01, 1955  MR#: [id number]  MBT#:[id number]  Patient Care Team: McLean-Scocuzza, Cathy Glow, MD as PCP - General (Internal Medicine)  CHIEF COMPLAINT:  CLL, Rai stage 1  INTERVAL HISTORY: Patient returns to clinic today for repeat laboratory work and routine 63-month follow-up.  She continues to feel well and remains asymptomatic. She does not complain of any weakness or fatigue today.  She remains active and continues to work full-time. She denies any fevers, night sweats, or weight loss. She has no neurologic complaints.  She denies any chest pain, shortness of breath, cough.  She has no nausea, vomiting, constipation, or diarrhea.  She has no urinary complaints.  Patient feels at her baseline offers no specific complaints today.  REVIEW OF SYSTEMS:   Review of Systems  Constitutional: Negative.  Negative for diaphoresis, fever, malaise/fatigue and weight loss.  Respiratory: Negative.  Negative for cough and shortness of breath.   Cardiovascular: Negative for chest pain and leg swelling.  Gastrointestinal: Negative for abdominal pain, constipation and nausea.  Genitourinary: Negative.  Negative for dysuria.  Musculoskeletal: Negative.   Skin: Negative.  Negative for rash.  Neurological: Negative.  Negative for sensory change, weakness and headaches.  Psychiatric/Behavioral: Negative.  The patient is not nervous/anxious.     As per HPI. Otherwise, a complete review of systems is negative.  PAST MEDICAL HISTORY: Past Medical History:  Diagnosis Date  . Anxiety   . Chest pain   . Chicken pox   . CLL (chronic lymphocytic leukemia) ([place])    dx'ed 2015   . Complication of anesthesia    woke up with severe cramps in extremities (26 years ago)  . DUB (dysfunctional uterine bleeding)    s/p D&C Dr. Enzo Bi   . Dysthymia   . Hyperlipidemia   . Influenza    history of x 2   .  Personal history of chemotherapy   . Splenomegaly    hx of  . Thyroid nodule     PAST SURGICAL HISTORY: Past Surgical History:  Procedure Laterality Date  . HYSTEROSCOPY W/D&C N/A 06/02/2016   Procedure: DILATATION AND CURETTAGE /HYSTEROSCOPY 2/2 DUB;  Surgeon: Brayton Mars, MD;  Location: [facility] ORS;  Service: Gynecology;  Laterality: N/A;  . TUBAL LIGATION      FAMILY HISTORY Family History  Problem Relation Age of Onset  . Stroke Mother 81       CVA  . Arthritis Mother   . Deep vein thrombosis Brother 45       recurrent DVTs.  . Stroke Maternal Grandmother   . Colon cancer Maternal Grandmother   . Cancer Maternal Grandmother        colon cancer dx'ed 66s   . Stroke Maternal Grandfather   . Diabetes Maternal Grandfather   . Diabetes Sister   . Breast cancer Neg Hx   . Ovarian cancer Neg Hx        ADVANCED DIRECTIVES:    HEALTH MAINTENANCE: Social History   Tobacco Use  . Smoking status: Never Smoker  . Smokeless tobacco: Never Used  Substance Use Topics  . Alcohol use: Yes    Comment: occasionally   . Drug use: No     No Known Allergies  Current Outpatient Medications  Medication Sig Dispense Refill  . acyclovir (ZOVIRAX) 400 MG tablet Take 1 tablet (400 mg total) by mouth daily. 30 tablet 3  . allopurinol (ZYLOPRIM) 300 MG tablet Take  1 tablet by mouth 1 day or 1 dose.    . b complex vitamins capsule Take 1 capsule by mouth daily.    Marland Kitchen buPROPion (WELLBUTRIN XL) 150 MG 24 hr tablet Take 1 tablet (150 mg total) by mouth daily. In am 7 tablet 0  . buPROPion (WELLBUTRIN) 75 MG tablet Week 2 daily x 1 week then every other day 14 tablet 0  . cefUROXime (CEFTIN) 500 MG tablet cefuroxime axetil 500 mg tablet    . Cholecalciferol (VITAMIN D3) 5000 units TABS Take by mouth daily.    . meloxicam (MOBIC) 7.5 MG tablet Take 1 tablet (7.5 mg total) by mouth daily as needed for pain. 90 tablet 0  . ondansetron (ZOFRAN) 8 MG tablet Take 1 tablet by mouth 1 day  or 1 dose.     No current facility-administered medications for this visit.     OBJECTIVE: There were no vitals filed for this visit.   There is no height or weight on file to calculate BMI.    ECOG FS:0 - Asymptomatic  General: Well-developed, well-nourished, no acute distress. Eyes: Pink conjunctiva, anicteric sclera. HEENT: Normocephalic, moist mucous membranes, clear oropharnyx.  No palpable lymphadenopathy. Lungs: Clear to auscultation bilaterally. Heart: Regular rate and rhythm. No rubs, murmurs, or gallops. Abdomen: Soft, nontender, nondistended. No organomegaly noted, normoactive bowel sounds. Musculoskeletal: No edema, cyanosis, or clubbing. Neuro: Alert, answering all questions appropriately. Cranial nerves grossly intact. Skin: No rashes or petechiae noted. Psych: Normal affect. Lymphatics: No cervical, calvicular, axillary or inguinal LAD.   LAB RESULTS:  Lab Results  Component Value Date   NA 141 12/14/2018   K 3.6 12/14/2018   CL 107 12/14/2018   CO2 28 12/14/2018   GLUCOSE 148 (H) 12/14/2018   BUN 19 12/14/2018   CREATININE 0.96 12/14/2018   CALCIUM 8.9 12/14/2018   PROT 6.5 12/14/2018   ALBUMIN 4.1 12/14/2018   AST 17 12/14/2018   ALT 15 12/14/2018   ALKPHOS 66 12/14/2018   BILITOT 0.4 12/14/2018   GFRNONAA >60 12/14/2018   GFRAA >60 12/14/2018    Lab Results  Component Value Date   WBC 14.1 (H) 12/14/2018   NEUTROABS 3.4 12/14/2018   HGB 12.3 12/14/2018   HCT 37.0 12/14/2018   MCV 88.5 12/14/2018   PLT 278 12/14/2018     STUDIES: No results found.  ASSESSMENT: CLL, Rai stage 1  PLAN:    1.  CLL: Peripheral blood flow cytometry confirmed the results. Patient completed her second round of 4 cycles of weekly Rituxan on Jan 16, 2016. Patient could not tolerate Imbruvica. She completed cycle 3 of dose reduced Treanda and Rituxan on April 28, 2017. CT results from May 22, 2017 reviewed independently with an excellent response to  treatment. No further intervention is needed at this time.  Patient has requested no further imaging unless there is suspicion of progressive disease.  Return to clinic in 3 months with repeat laboratory work only and then in 6 months with repeat laboratory work and further evaluation. 2.  Leukocytosis: Patient's white blood cell count is 10.9, monitor.  3.  Cold sores: Patient was given a prescription for acyclovir to use as needed.   Patient expressed understanding and was in agreement with this plan. She also understands that She can call clinic at any time with any questions, concerns, or complaints.    Lloyd Huger, MD   03/26/2019 10:41 AM

## 2019-03-29 ENCOUNTER — Inpatient Hospital Stay: Payer: BC Managed Care – PPO

## 2019-03-29 ENCOUNTER — Inpatient Hospital Stay: Payer: BC Managed Care – PPO | Admitting: Oncology

## 2019-03-30 ENCOUNTER — Encounter: Payer: Self-pay | Admitting: Oncology

## 2019-04-11 NOTE — Progress Notes (Signed)
[place]  Telephone:(336) [phone] Fax:(336) [phone]  ID: Cathy [name] OB: 08/02/1956  MR#: [id number]  [id number]  Patient Care Team: McLean-Scocuzza, Nino Glow, MD as PCP - General (Internal Medicine)  CHIEF COMPLAINT:  CLL, Rai stage 1  INTERVAL HISTORY: Patient returns to clinic today for repeat laboratory work and further evaluation.  She has noticed some mild increase in her cervical lymphadenopathy, but otherwise feels well. She does not complain of any weakness or fatigue today.  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  She denies any fevers, night sweats, or weight loss. She has no neurologic complaints.  She denies any chest pain, shortness of breath, cough.  She has no nausea, vomiting, constipation, or diarrhea.  She has no urinary complaints.  Patient offers no further specific complaints today.  REVIEW OF SYSTEMS:   Review of Systems  Constitutional: Negative.  Negative for diaphoresis, fever, malaise/fatigue and weight loss.  Respiratory: Negative.  Negative for cough and shortness of breath.   Cardiovascular: Negative for chest pain and leg swelling.  Gastrointestinal: Negative for abdominal pain, constipation and nausea.  Genitourinary: Negative.  Negative for dysuria.  Musculoskeletal: Negative.   Skin: Negative.  Negative for rash.  Neurological: Negative.  Negative for sensory change, weakness and headaches.  Psychiatric/Behavioral: Negative.  The patient is not nervous/anxious.     As per HPI. Otherwise, a complete review of systems is negative.  PAST MEDICAL HISTORY: Past Medical History:  Diagnosis Date  . Anxiety   . Chest pain   . Chicken pox   . CLL (chronic lymphocytic leukemia) ([place])    dx'ed 2015   . Complication of anesthesia    woke up with severe cramps in extremities (26 years ago)  . DUB (dysfunctional uterine bleeding)    s/p D&C Dr.  Enzo Bi   . Dysthymia   . Hyperlipidemia   . Influenza    history of x 2   . Personal history of chemotherapy   . Splenomegaly    hx of  . Thyroid nodule     PAST SURGICAL HISTORY: Past Surgical History:  Procedure Laterality Date  . HYSTEROSCOPY W/D&C N/A 06/02/2016   Procedure: DILATATION AND CURETTAGE /HYSTEROSCOPY 2/2 DUB;  Surgeon: Brayton Mars, MD;  Location: [facility] ORS;  Service: Gynecology;  Laterality: N/A;  . TUBAL LIGATION      FAMILY HISTORY Family History  Problem Relation Age of Onset  . Stroke Mother 48       CVA  . Arthritis Mother   . Deep vein thrombosis Brother 45       recurrent DVTs.  . Stroke Maternal Grandmother   . Colon cancer Maternal Grandmother   . Cancer Maternal Grandmother        colon cancer dx'ed 60s   . Stroke Maternal Grandfather   . Diabetes Maternal Grandfather   . Diabetes Sister   . Breast cancer Neg Hx   . Ovarian cancer Neg Hx        ADVANCED DIRECTIVES:    HEALTH MAINTENANCE: Social History   Tobacco Use  . Smoking status: Never Smoker  . Smokeless tobacco: Never Used  Substance Use Topics  . Alcohol use: Yes    Comment: occasionally   . Drug use: No     No Known Allergies  Current Outpatient Medications  Medication Sig Dispense Refill  . acyclovir (ZOVIRAX) 400 MG tablet Take 1 tablet (400 mg  total) by mouth daily. 30 tablet 3  . allopurinol (ZYLOPRIM) 300 MG tablet Take 1 tablet by mouth 1 day or 1 dose.    . b complex vitamins capsule Take 1 capsule by mouth daily.    Marland Kitchen buPROPion (WELLBUTRIN XL) 150 MG 24 hr tablet Take 1 tablet (150 mg total) by mouth daily. In am 7 tablet 0  . buPROPion (WELLBUTRIN) 75 MG tablet Week 2 daily x 1 week then every other day 14 tablet 0  . cefUROXime (CEFTIN) 500 MG tablet cefuroxime axetil 500 mg tablet    . Cholecalciferol (VITAMIN D3) 5000 units TABS Take by mouth daily.    . meloxicam (MOBIC) 7.5 MG tablet Take 1 tablet (7.5 mg total) by mouth daily as needed  for pain. 90 tablet 0  . ondansetron (ZOFRAN) 8 MG tablet Take 1 tablet by mouth 1 day or 1 dose.     No current facility-administered medications for this visit.     OBJECTIVE: Vitals:   04/15/19 1016  BP: 122/70  Pulse: 65  Resp: 20  Temp: (!) 96.2 F (35.7 C)     Body mass index is 24.35 kg/m.    ECOG FS:0 - Asymptomatic  General: Well-developed, well-nourished, no acute distress. Eyes: Pink conjunctiva, anicteric sclera. HEENT: Normocephalic, moist mucous membranes, clear oropharnyx.  Minimally palpable lymphadenopathy. Lungs: Clear to auscultation bilaterally. Heart: Regular rate and rhythm. No rubs, murmurs, or gallops. Abdomen: Soft, nontender, nondistended. No organomegaly noted, normoactive bowel sounds. Musculoskeletal: No edema, cyanosis, or clubbing. Neuro: Alert, answering all questions appropriately. Cranial nerves grossly intact. Skin: No rashes or petechiae noted. Psych: Normal affect.   LAB RESULTS:  Lab Results  Component Value Date   NA 137 04/15/2019   K 3.8 04/15/2019   CL 103 04/15/2019   CO2 27 04/15/2019   GLUCOSE 92 04/15/2019   BUN 22 (H) 04/15/2019   CREATININE 0.93 04/15/2019   CALCIUM 9.2 04/15/2019   PROT 6.8 04/15/2019   ALBUMIN 4.3 04/15/2019   AST 17 04/15/2019   ALT 15 04/15/2019   ALKPHOS 63 04/15/2019   BILITOT 0.8 04/15/2019   GFRNONAA >60 04/15/2019   GFRAA >60 04/15/2019    Lab Results  Component Value Date   WBC 30.3 (H) 04/15/2019   NEUTROABS 3.3 04/15/2019   HGB 12.4 04/15/2019   HCT 38.4 04/15/2019   MCV 88.9 04/15/2019   PLT 269 04/15/2019     STUDIES: No results found.  ASSESSMENT: CLL, Rai stage 1  PLAN:    1.  CLL: Peripheral blood flow cytometry confirmed the results. Patient completed her second round of 4 cycles of weekly Rituxan on Jan 16, 2016.  She completed cycle 3 of dose reduced Treanda and Rituxan on April 28, 2017. CT results from May 22, 2017 reviewed independently with an excellent  response to treatment.  Given patient is increasing white count and mild lymphadenopathy, we discussed possibly reinitiating treatment.  Either with Rituxan/Treanda or possibly dose reduced Imbruvica (patient previously had significant side effects with full dose.)  Given patient is relatively asymptomatic, we agreed not to start any systemic treatment at this time given the COVID-19 pandemic.  She will return to clinic in 3 months with repeat laboratory work, further evaluation, and re-discussion of whether or not to initiate treatment.   I spent a total of 30 minutes face-to-face with the patient of which greater than 50% of the visit was spent in counseling and coordination of care as detailed above.  Patient expressed understanding  and was in agreement with this plan. She also understands that She can call clinic at any time with any questions, concerns, or complaints.    Lloyd Huger, MD   04/15/2019 3:46 PM

## 2019-04-15 ENCOUNTER — Encounter: Payer: Self-pay | Admitting: Oncology

## 2019-04-15 ENCOUNTER — Inpatient Hospital Stay: Payer: BC Managed Care – PPO | Attending: Oncology

## 2019-04-15 ENCOUNTER — Other Ambulatory Visit: Payer: Self-pay

## 2019-04-15 ENCOUNTER — Inpatient Hospital Stay (HOSPITAL_BASED_OUTPATIENT_CLINIC_OR_DEPARTMENT_OTHER): Payer: BC Managed Care – PPO | Admitting: Oncology

## 2019-04-15 ENCOUNTER — Telehealth: Payer: Self-pay | Admitting: *Deleted

## 2019-04-15 VITALS — BP 122/70 | HR 65 | Temp 96.2°F | Resp 20 | Ht 65.5 in | Wt 148.6 lb

## 2019-04-15 DIAGNOSIS — Z79899 Other long term (current) drug therapy: Secondary | ICD-10-CM | POA: Diagnosis not present

## 2019-04-15 DIAGNOSIS — E785 Hyperlipidemia, unspecified: Secondary | ICD-10-CM | POA: Diagnosis not present

## 2019-04-15 DIAGNOSIS — R590 Localized enlarged lymph nodes: Secondary | ICD-10-CM | POA: Diagnosis not present

## 2019-04-15 DIAGNOSIS — C9110 Chronic lymphocytic leukemia of B-cell type not having achieved remission: Secondary | ICD-10-CM | POA: Diagnosis not present

## 2019-04-15 DIAGNOSIS — Z9221 Personal history of antineoplastic chemotherapy: Secondary | ICD-10-CM | POA: Insufficient documentation

## 2019-04-15 LAB — COMPREHENSIVE METABOLIC PANEL
ALT: 15 U/L (ref 0–44)
AST: 17 U/L (ref 15–41)
Albumin: 4.3 g/dL (ref 3.5–5.0)
Alkaline Phosphatase: 63 U/L (ref 38–126)
Anion gap: 7 (ref 5–15)
BUN: 22 mg/dL — ABNORMAL HIGH (ref 6–20)
CO2: 27 mmol/L (ref 22–32)
Calcium: 9.2 mg/dL (ref 8.9–10.3)
Chloride: 103 mmol/L (ref 98–111)
Creatinine, Ser: 0.93 mg/dL (ref 0.44–1.00)
GFR calc Af Amer: >60 mL/min (ref >60)
GFR calc non Af Amer: >60 mL/min (ref >60)
Glucose, Bld: 92 mg/dL (ref 70–99)
Potassium: 3.8 mmol/L (ref 3.5–5.1)
Sodium: 137 mmol/L (ref 135–145)
Total Bilirubin: 0.8 mg/dL (ref 0.3–1.2)
Total Protein: 6.8 g/dL (ref 6.5–8.1)

## 2019-04-15 LAB — CBC WITH DIFFERENTIAL/PLATELET
Abs Immature Granulocytes: 0 10*3/uL (ref 0.00–0.07)
Basophils Absolute: 0 10*3/uL (ref 0.0–0.1)
Basophils Relative: 0 %
Eosinophils Absolute: 0.3 10*3/uL (ref 0.0–0.5)
Eosinophils Relative: 1 %
HCT: 38.4 % (ref 36.0–46.0)
Hemoglobin: 12.4 g/dL (ref 12.0–15.0)
Lymphocytes Relative: 83 %
Lymphs Abs: 25.1 10*3/uL — ABNORMAL HIGH (ref 0.7–4.0)
MCH: 28.7 pg (ref 26.0–34.0)
MCHC: 32.3 g/dL (ref 30.0–36.0)
MCV: 88.9 fL (ref 80.0–100.0)
Monocytes Absolute: 1.5 10*3/uL — ABNORMAL HIGH (ref 0.1–1.0)
Monocytes Relative: 5 %
Neutro Abs: 3.3 10*3/uL (ref 1.7–7.7)
Neutrophils Relative %: 11 %
Platelets: 269 10*3/uL (ref 150–400)
RBC Morphology: NORMAL
RBC: 4.32 MIL/uL (ref 3.87–5.11)
RDW: 14 % (ref 11.5–15.5)
Smear Review: NORMAL
WBC Morphology: ABNORMAL
WBC: 30.3 10*3/uL — ABNORMAL HIGH (ref 4.0–10.5)
nRBC: 0 % (ref 0.0–0.2)

## 2019-04-15 NOTE — Telephone Encounter (Signed)
Patient called asking to be referred for a second opinion to [facility] Dr Lanette Hampshire CLL Specialist Ph # [phone]. Patient number is [phone] if you need to speak to her.

## 2019-04-15 NOTE — Progress Notes (Signed)
No new complaints today. Interested in the flu shot.

## 2019-04-15 NOTE — Telephone Encounter (Signed)
ok 

## 2019-04-18 NOTE — Telephone Encounter (Signed)
Referral was faxed today to [facility]-Dr. Dorothea Ogle per patient's request and okay by Dr. Grayland Ormond for a second opinion.

## 2019-04-25 ENCOUNTER — Encounter: Payer: Self-pay | Admitting: Oncology

## 2019-05-23 ENCOUNTER — Encounter: Payer: Self-pay | Admitting: Oncology

## 2019-05-27 ENCOUNTER — Encounter: Payer: Self-pay | Admitting: Oncology

## 2019-05-30 ENCOUNTER — Encounter: Payer: Self-pay | Admitting: *Deleted

## 2019-06-14 DIAGNOSIS — C9112 Chronic lymphocytic leukemia of B-cell type in relapse: Secondary | ICD-10-CM | POA: Diagnosis not present

## 2019-06-14 DIAGNOSIS — C9110 Chronic lymphocytic leukemia of B-cell type not having achieved remission: Secondary | ICD-10-CM | POA: Diagnosis not present

## 2019-06-14 DIAGNOSIS — R61 Generalized hyperhidrosis: Secondary | ICD-10-CM | POA: Diagnosis not present

## 2019-06-14 DIAGNOSIS — R5383 Other fatigue: Secondary | ICD-10-CM | POA: Diagnosis not present

## 2019-06-14 DIAGNOSIS — Z23 Encounter for immunization: Secondary | ICD-10-CM | POA: Diagnosis not present

## 2019-06-20 DIAGNOSIS — M67833 Other specified disorders of tendon, right wrist: Secondary | ICD-10-CM | POA: Diagnosis not present

## 2019-06-22 ENCOUNTER — Encounter: Payer: Self-pay | Admitting: Internal Medicine

## 2019-06-22 ENCOUNTER — Ambulatory Visit (INDEPENDENT_AMBULATORY_CARE_PROVIDER_SITE_OTHER): Payer: BC Managed Care – PPO | Admitting: Internal Medicine

## 2019-06-22 ENCOUNTER — Other Ambulatory Visit: Payer: Self-pay

## 2019-06-22 ENCOUNTER — Telehealth: Payer: Self-pay

## 2019-06-22 VITALS — Ht 65.0 in | Wt 148.0 lb

## 2019-06-22 DIAGNOSIS — Z13220 Encounter for screening for lipoid disorders: Secondary | ICD-10-CM

## 2019-06-22 DIAGNOSIS — N3000 Acute cystitis without hematuria: Secondary | ICD-10-CM | POA: Diagnosis not present

## 2019-06-22 DIAGNOSIS — C9110 Chronic lymphocytic leukemia of B-cell type not having achieved remission: Secondary | ICD-10-CM

## 2019-06-22 DIAGNOSIS — Z1329 Encounter for screening for other suspected endocrine disorder: Secondary | ICD-10-CM | POA: Diagnosis not present

## 2019-06-22 DIAGNOSIS — Z1231 Encounter for screening mammogram for malignant neoplasm of breast: Secondary | ICD-10-CM | POA: Diagnosis not present

## 2019-06-22 DIAGNOSIS — Z1211 Encounter for screening for malignant neoplasm of colon: Secondary | ICD-10-CM

## 2019-06-22 DIAGNOSIS — E559 Vitamin D deficiency, unspecified: Secondary | ICD-10-CM

## 2019-06-22 DIAGNOSIS — R87610 Atypical squamous cells of undetermined significance on cytologic smear of cervix (ASC-US): Secondary | ICD-10-CM

## 2019-06-22 MED ORDER — CIPROFLOXACIN HCL 500 MG PO TABS: 500.0000 mg | ORAL_TABLET | Freq: Two times a day (BID) | ORAL | 0 refills | Status: DC

## 2019-06-22 MED ORDER — PHENAZOPYRIDINE HCL 200 MG PO TABS: 200.0000 mg | ORAL_TABLET | Freq: Three times a day (TID) | ORAL | 0 refills | Status: DC | PRN

## 2019-06-22 NOTE — Progress Notes (Signed)
Labs and note have been sent electrionically

## 2019-06-22 NOTE — Progress Notes (Addendum)
telephone Note  I connected with Cathy [name]  on 06/22/19 at  1:00 PM EST by a telephone and verified that I am speaking with the correct person using two identifiers.  Location patient: home Location provider:work or home office Persons participating in the virtual visit: patient, provider  I discussed the limitations of evaluation and management by telemedicine and the availability of in person appointments. The patient expressed understanding and agreed to proceed.   HPI: 1. H/o UTI with increased frequency, pain, trouble not emptying bladder x 2 nights and blood increased water intake     ROS: See pertinent positives and negatives per HPI.  Past Medical History:  Diagnosis Date  . Anxiety   . Chest pain   . Chicken pox   . CLL (chronic lymphocytic leukemia) ([place])    dx'ed 2015   . Complication of anesthesia    woke up with severe cramps in extremities (26 years ago)  . DUB (dysfunctional uterine bleeding)    s/p D&C Dr. Enzo Bi   . Dysthymia   . Hyperlipidemia   . Influenza    history of x 2   . Personal history of chemotherapy   . Splenomegaly    hx of  . Thyroid nodule     Past Surgical History:  Procedure Laterality Date  . HYSTEROSCOPY W/D&C N/A 06/02/2016   Procedure: DILATATION AND CURETTAGE /HYSTEROSCOPY 2/2 DUB;  Surgeon: Brayton Mars, MD;  Location: [facility] ORS;  Service: Gynecology;  Laterality: N/A;  . TUBAL LIGATION      Family History  Problem Relation Age of Onset  . Stroke Mother 61       CVA  . Arthritis Mother   . Deep vein thrombosis Brother 45       recurrent DVTs.  . Stroke Maternal Grandmother   . Colon cancer Maternal Grandmother   . Cancer Maternal Grandmother        colon cancer dx'ed 8s   . Stroke Maternal Grandfather   . Diabetes Maternal Grandfather   . Diabetes Sister   . Breast cancer Neg Hx   . Ovarian cancer Neg Hx     SOCIAL HX:  B.S  Works at [employer]  3 kids (2 girls and 1 boy) and  grandchildren    Current Outpatient Medications:  .  acyclovir (ZOVIRAX) 400 MG tablet, Take 1 tablet (400 mg total) by mouth daily., Disp: 30 tablet, Rfl: 3 .  allopurinol (ZYLOPRIM) 300 MG tablet, Take 1 tablet by mouth 1 day or 1 dose., Disp: , Rfl:  .  b complex vitamins capsule, Take 1 capsule by mouth daily., Disp: , Rfl:  .  buPROPion (WELLBUTRIN XL) 150 MG 24 hr tablet, Take 1 tablet (150 mg total) by mouth daily. In am, Disp: 7 tablet, Rfl: 0 .  buPROPion (WELLBUTRIN) 75 MG tablet, Week 2 daily x 1 week then every other day, Disp: 14 tablet, Rfl: 0 .  cefUROXime (CEFTIN) 500 MG tablet, cefuroxime axetil 500 mg tablet, Disp: , Rfl:  .  Cholecalciferol (VITAMIN D3) 5000 units TABS, Take by mouth daily., Disp: , Rfl:  .  meloxicam (MOBIC) 7.5 MG tablet, Take 1 tablet (7.5 mg total) by mouth daily as needed for pain., Disp: 90 tablet, Rfl: 0 .  ondansetron (ZOFRAN) 8 MG tablet, Take 1 tablet by mouth 1 day or 1 dose., Disp: , Rfl:  .  ciprofloxacin (CIPRO) 500 MG tablet, Take 1 tablet (500 mg total) by mouth 2 (two) times daily. With  food, Disp: 10 tablet, Rfl: 0 .  phenazopyridine (PYRIDIUM) 200 MG tablet, Take 1 tablet (200 mg total) by mouth 3 (three) times daily as needed for pain., Disp: 10 tablet, Rfl: 0  EXAM:  VITALS per patient if applicable:  GENERAL: alert, oriented, appears well and in no acute distress  PSYCH/NEURO: pleasant and cooperative, no obvious depression or anxiety, speech and thought processing grossly intact  ASSESSMENT AND PLAN:  Discussed the following assessment and plan:  Acute cystitis without hematuria - Plan: Urinalysis, Routine w reflex microscopic, Urine Culture, phenazopyridine (PYRIDIUM) 200 MG tablet, ciprofloxacin (CIPRO) 500 MG tablet bid x 5 days    Atypical squamous cells of undetermined significance on cytologic smear of cervix (ASC-US) - Plan: Ambulatory referral to Obstetrics / Gynecology Dr. Marcelline Mates    Encounter for screening  colonoscopy - Plan: Ambulatory referral to Gastroenterology Dr. Allen Norris   cll f/u with Dr. Dorothea Ogle 07/19/2019 [facility] going up may need tx in future   H UTD flu and Tdap  Given Rx shingrix prev  Declines hep B vaccine for now Disc MMR titer and lipid check will see if h/o will do at labs 02/2018   Hep C neg, TB quant neg, rec hep B vaccine pt declines   Need to check lipid in the future h/o HLD will do 07/19/2019 with h/o labs  mammo 08/25/17 neg ordered [place]bane 06/22/2019  Colonoscopy never had disc again referred Dr. Allen Norris  rec f/u with OB/GYN for paps Dr. Enzo Bi saw 10/2016 for colp has fibroids, pap with h/o HPV + high risk per note colp was inadequate was rec she f/u in 6 months would have been due 04/2017  -ASCUS pap h/o HPV referred back to Dr. Marcelline Mates for f/u  Pt wants to hold derm referral prev discussed but not 06/22/2019   [facility] 06/27/2019 tendonitis right wrist given mobic 7.5 bid wrist cock up brace Dr. Raliegh Ip   -we discussed possible serious and likely etiologies, options for evaluation and workup, limitations of telemedicine visit vs in person visit, treatment, treatment risks and precautions. Pt prefers to treat via telemedicine empirically rather then risking or undertaking an in person visit at this moment. Patient agrees to seek prompt in person care if worsening, new symptoms arise, or if is not improving with treatment.   I discussed the assessment and treatment plan with the patient. The patient was provided an opportunity to ask questions and all were answered. The patient agreed with the plan and demonstrated an understanding of the instructions.   The patient was advised to call back or seek an in-person evaluation if the symptoms worsen or if the condition fails to improve as anticipated.  Time spent 25 minutes  Delorise Jackson, MD

## 2019-06-22 NOTE — Telephone Encounter (Signed)
Gastroenterology Pre-Procedure Review  Request Date: 07/18/19 Requesting Physician: Dr. Allen Norris  PATIENT REVIEW QUESTIONS: The patient responded to the following health history questions as indicated:    1. Are you having any GI issues? no 2. Do you have a personal history of Polyps? no 3. Do you have a family history of Colon Cancer or Polyps? yes (maternal grandmother had colon cancer) 4. Diabetes Mellitus? no 5. Joint replacements in the past 12 months?no 6. Major health problems in the past 3 months?no 7. Any artificial heart valves, MVP, or defibrillator?no    MEDICATIONS & ALLERGIES:    Patient reports the following regarding taking any anticoagulation/antiplatelet therapy:   Plavix, Coumadin, Eliquis, Xarelto, Lovenox, Pradaxa, Brilinta, or Effient? no Aspirin? no  Patient confirms/reports the following medications:  Current Outpatient Medications  Medication Sig Dispense Refill  . acyclovir (ZOVIRAX) 400 MG tablet Take 1 tablet (400 mg total) by mouth daily. 30 tablet 3  . allopurinol (ZYLOPRIM) 300 MG tablet Take 1 tablet by mouth 1 day or 1 dose.    . b complex vitamins capsule Take 1 capsule by mouth daily.    Marland Kitchen buPROPion (WELLBUTRIN XL) 150 MG 24 hr tablet Take 1 tablet (150 mg total) by mouth daily. In am 7 tablet 0  . buPROPion (WELLBUTRIN) 75 MG tablet Week 2 daily x 1 week then every other day 14 tablet 0  . cefUROXime (CEFTIN) 500 MG tablet cefuroxime axetil 500 mg tablet    . Cholecalciferol (VITAMIN D3) 5000 units TABS Take by mouth daily.    . ciprofloxacin (CIPRO) 500 MG tablet Take 1 tablet (500 mg total) by mouth 2 (two) times daily. With food 10 tablet 0  . meloxicam (MOBIC) 7.5 MG tablet Take 1 tablet (7.5 mg total) by mouth daily as needed for pain. 90 tablet 0  . ondansetron (ZOFRAN) 8 MG tablet Take 1 tablet by mouth 1 day or 1 dose.    . phenazopyridine (PYRIDIUM) 200 MG tablet Take 1 tablet (200 mg total) by mouth 3 (three) times daily as needed for pain.  10 tablet 0   No current facility-administered medications for this visit.     Patient confirms/reports the following allergies:  No Known Allergies  No orders of the defined types were placed in this encounter.   AUTHORIZATION INFORMATION Primary Insurance: 1D#: Group #:  Secondary Insurance: 1D#: Group #:  SCHEDULE INFORMATION: Date: Monday 07/18/19 Time: Location:[facility]

## 2019-06-25 LAB — URINE CULTURE

## 2019-06-25 LAB — URINALYSIS, ROUTINE W REFLEX MICROSCOPIC
Bilirubin, UA: NEGATIVE
Glucose, UA: NEGATIVE
Ketones, UA: NEGATIVE
Nitrite, UA: NEGATIVE
Protein,UA: NEGATIVE
Specific Gravity, UA: 1.008 (ref 1.005–1.030)
Urobilinogen, Ur: 0.2 mg/dL (ref 0.2–1.0)
pH, UA: 6.5 (ref 5.0–7.5)

## 2019-06-25 LAB — MICROSCOPIC EXAMINATION
Bacteria, UA: NONE SEEN
Casts: NONE SEEN /lpf
Epithelial Cells (non renal): NONE SEEN /hpf (ref 0–10)

## 2019-06-29 ENCOUNTER — Encounter: Payer: Self-pay | Admitting: Internal Medicine

## 2019-07-06 ENCOUNTER — Telehealth: Payer: Self-pay | Admitting: Gastroenterology

## 2019-07-06 NOTE — Telephone Encounter (Signed)
Pt left vm stating she has a complication with [id number] date for procedure  Please call to r/s

## 2019-07-07 ENCOUNTER — Ambulatory Visit
Admission: RE | Admit: 2019-07-07 | Discharge: 2019-07-07 | Disposition: A | Discharge status: Home / Self Care | Payer: BC Managed Care – PPO | Source: Ambulatory Visit | Attending: Internal Medicine | Admitting: Internal Medicine

## 2019-07-07 ENCOUNTER — Other Ambulatory Visit: Payer: Self-pay

## 2019-07-07 DIAGNOSIS — Z1231 Encounter for screening mammogram for malignant neoplasm of breast: Secondary | ICD-10-CM | POA: Insufficient documentation

## 2019-07-07 NOTE — Telephone Encounter (Signed)
Colonoscopy has been rescheduled from 07/18/19 to 08/01/19 with Dr. Allen Norris at [facility].  Kim at [facility] has been informed.  Thanks  [employer]

## 2019-07-13 NOTE — Progress Notes (Deleted)
[facility]  [phone]) [phone] [phone]) [phone]  ID: Cathy [name] OB: 19-Jun-1956  MR#: [id number]  [id number]  Patient Care Team: McLean-Scocuzza, Nino Glow, MD as PCP - General (Internal Medicine)  I connected with Cathy [name] on 07/13/19 at  2:00 PM EST by {Blank single:19197::"video enabled telemedicine visit","telephone visit"} and verified that I am speaking with the correct person using two identifiers.   I discussed the limitations, risks, security and privacy concerns of performing an evaluation and management service by telemedicine and the availability of in-person appointments. I also discussed with the patient that there may be a patient responsible charge related to this service. The patient expressed understanding and agreed to proceed.   Other persons participating in the visit and their role in the encounter: ***   Patient's location: ***  Provider's location: ***   CHIEF COMPLAINT:  CLL, Rai stage 1  INTERVAL HISTORY: Patient returns to clinic today for repeat laboratory work and further evaluation.  She has noticed some mild increase in her cervical lymphadenopathy, but otherwise feels well. She does not complain of any weakness or fatigue today.  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  She denies any fevers, night sweats, or weight loss. She has no neurologic complaints.  She denies any chest pain, shortness of breath, cough.  She has no nausea, vomiting, constipation, or diarrhea.  She has no urinary complaints.  Patient offers no further specific complaints today.  REVIEW OF SYSTEMS:   Review of Systems  Constitutional: Negative.  Negative for diaphoresis, fever, malaise/fatigue and weight loss.  Respiratory: Negative.  Negative for cough and shortness of breath.   Cardiovascular: Negative for chest pain and leg swelling.  Gastrointestinal: Negative for  abdominal pain, constipation and nausea.  Genitourinary: Negative.  Negative for dysuria.  Musculoskeletal: Negative.   Skin: Negative.  Negative for rash.  Neurological: Negative.  Negative for sensory change, weakness and headaches.  Psychiatric/Behavioral: Negative.  The patient is not nervous/anxious.     As per HPI. Otherwise, a complete review of systems is negative.  PAST MEDICAL HISTORY: Past Medical History:  Diagnosis Date  . Anxiety   . Chest pain   . Chicken pox   . CLL (chronic lymphocytic leukemia) ([place])    dx'ed 2015   . Complication of anesthesia    woke up with severe cramps in extremities (26 years ago)  . DUB (dysfunctional uterine bleeding)    s/p D&C Dr. Enzo Bi   . Dysthymia   . Hyperlipidemia   . Influenza    history of x 2   . Personal history of chemotherapy   . Splenomegaly    hx of  . Thyroid nodule     PAST SURGICAL HISTORY: Past Surgical History:  Procedure Laterality Date  . HYSTEROSCOPY W/D&C N/A 06/02/2016   Procedure: DILATATION AND CURETTAGE /HYSTEROSCOPY 2/2 DUB;  Surgeon: Brayton Mars, MD;  Location: [facility] ORS;  Service: Gynecology;  Laterality: N/A;  . TUBAL LIGATION      FAMILY HISTORY Family History  Problem Relation Age of Onset  . Stroke Mother 89       CVA  . Arthritis Mother   . Deep vein thrombosis Brother 45       recurrent DVTs.  . Stroke Maternal Grandmother   . Colon cancer Maternal Grandmother   . Cancer Maternal Grandmother        colon cancer dx'ed 77s   .  Stroke Maternal Grandfather   . Diabetes Maternal Grandfather   . Diabetes Sister   . Breast cancer Neg Hx   . Ovarian cancer Neg Hx        ADVANCED DIRECTIVES:    HEALTH MAINTENANCE: Social History   Tobacco Use  . Smoking status: Never Smoker  . Smokeless tobacco: Never Used  Substance Use Topics  . Alcohol use: Yes    Comment: occasionally   . Drug use: No     No Known Allergies  Current Outpatient Medications  Medication  Sig Dispense Refill  . acyclovir (ZOVIRAX) 400 MG tablet Take 1 tablet (400 mg total) by mouth daily. 30 tablet 3  . allopurinol (ZYLOPRIM) 300 MG tablet Take 1 tablet by mouth 1 day or 1 dose.    . b complex vitamins capsule Take 1 capsule by mouth daily.    Marland Kitchen buPROPion (WELLBUTRIN XL) 150 MG 24 hr tablet Take 1 tablet (150 mg total) by mouth daily. In am 7 tablet 0  . buPROPion (WELLBUTRIN) 75 MG tablet Week 2 daily x 1 week then every other day 14 tablet 0  . cefUROXime (CEFTIN) 500 MG tablet cefuroxime axetil 500 mg tablet    . Cholecalciferol (VITAMIN D3) 5000 units TABS Take by mouth daily.    . ciprofloxacin (CIPRO) 500 MG tablet Take 1 tablet (500 mg total) by mouth 2 (two) times daily. With food 10 tablet 0  . meloxicam (MOBIC) 7.5 MG tablet Take 1 tablet (7.5 mg total) by mouth daily as needed for pain. 90 tablet 0  . ondansetron (ZOFRAN) 8 MG tablet Take 1 tablet by mouth 1 day or 1 dose.    . phenazopyridine (PYRIDIUM) 200 MG tablet Take 1 tablet (200 mg total) by mouth 3 (three) times daily as needed for pain. 10 tablet 0   No current facility-administered medications for this visit.     OBJECTIVE: There were no vitals filed for this visit.   There is no height or weight on file to calculate BMI.    ECOG FS:0 - Asymptomatic  General: Well-developed, well-nourished, no acute distress. Eyes: Pink conjunctiva, anicteric sclera. HEENT: Normocephalic, moist mucous membranes, clear oropharnyx.  Minimally palpable lymphadenopathy. Lungs: Clear to auscultation bilaterally. Heart: Regular rate and rhythm. No rubs, murmurs, or gallops. Abdomen: Soft, nontender, nondistended. No organomegaly noted, normoactive bowel sounds. Musculoskeletal: No edema, cyanosis, or clubbing. Neuro: Alert, answering all questions appropriately. Cranial nerves grossly intact. Skin: No rashes or petechiae noted. Psych: Normal affect.   LAB RESULTS:  Lab Results  Component Value Date   NA 137  04/15/2019   K 3.8 04/15/2019   CL 103 04/15/2019   CO2 27 04/15/2019   GLUCOSE 92 04/15/2019   BUN 22 (H) 04/15/2019   CREATININE 0.93 04/15/2019   CALCIUM 9.2 04/15/2019   PROT 6.8 04/15/2019   ALBUMIN 4.3 04/15/2019   AST 17 04/15/2019   ALT 15 04/15/2019   ALKPHOS 63 04/15/2019   BILITOT 0.8 04/15/2019   GFRNONAA >60 04/15/2019   GFRAA >60 04/15/2019    Lab Results  Component Value Date   WBC 30.3 (H) 04/15/2019   NEUTROABS 3.3 04/15/2019   HGB 12.4 04/15/2019   HCT 38.4 04/15/2019   MCV 88.9 04/15/2019   PLT 269 04/15/2019     STUDIES: Mm 3d Screen Breast Bilateral  Result Date: 07/07/2019 CLINICAL DATA:  Screening. EXAM: DIGITAL SCREENING BILATERAL MAMMOGRAM WITH TOMO AND CAD COMPARISON:  Previous exam(s). ACR Breast Density Category b: There are scattered  areas of fibroglandular density. FINDINGS: There are no findings suspicious for malignancy. Images were processed with CAD. IMPRESSION: No mammographic evidence of malignancy. A result letter of this screening mammogram will be mailed directly to the patient. RECOMMENDATION: Screening mammogram in one year. (Code:SM-B-01Y) BI-RADS CATEGORY  1: Negative. Electronically Signed   By: Lajean Manes M.D.   On: 07/07/2019 13:00    ASSESSMENT: CLL, Rai stage 1  PLAN:    1.  CLL: Peripheral blood flow cytometry confirmed the results. Patient completed her second round of 4 cycles of weekly Rituxan on Jan 16, 2016.  She completed cycle 3 of dose reduced Treanda and Rituxan on April 28, 2017. CT results from May 22, 2017 reviewed independently with an excellent response to treatment.  Given patient is increasing white count and mild lymphadenopathy, we discussed possibly reinitiating treatment.  Either with Rituxan/Treanda or possibly dose reduced Imbruvica (patient previously had significant side effects with full dose.)  Given patient is relatively asymptomatic, we agreed not to start any systemic treatment at this time  given the COVID-19 pandemic.  She will return to clinic in 3 months with repeat laboratory work, further evaluation, and re-discussion of whether or not to initiate treatment.   I provided *** minutes of {Blank single:19197::"face-to-face video visit time","non face-to-face telephone visit time"} during this encounter, and > 50% was spent counseling as documented under my assessment & plan.   Patient expressed understanding and was in agreement with this plan. She also understands that She can call clinic at any time with any questions, concerns, or complaints.    Lloyd Huger, MD   07/13/2019 2:27 PM

## 2019-07-18 ENCOUNTER — Inpatient Hospital Stay: Payer: BC Managed Care – PPO

## 2019-07-18 DIAGNOSIS — C9110 Chronic lymphocytic leukemia of B-cell type not having achieved remission: Secondary | ICD-10-CM | POA: Diagnosis not present

## 2019-07-18 DIAGNOSIS — R5382 Chronic fatigue, unspecified: Secondary | ICD-10-CM | POA: Diagnosis not present

## 2019-07-18 DIAGNOSIS — R61 Generalized hyperhidrosis: Secondary | ICD-10-CM | POA: Diagnosis not present

## 2019-07-18 DIAGNOSIS — R590 Localized enlarged lymph nodes: Secondary | ICD-10-CM | POA: Diagnosis not present

## 2019-07-18 DIAGNOSIS — Z87440 Personal history of urinary (tract) infections: Secondary | ICD-10-CM | POA: Diagnosis not present

## 2019-07-18 DIAGNOSIS — D72820 Lymphocytosis (symptomatic): Secondary | ICD-10-CM | POA: Diagnosis not present

## 2019-07-19 ENCOUNTER — Inpatient Hospital Stay: Payer: BC Managed Care – PPO | Admitting: Oncology

## 2019-07-26 ENCOUNTER — Other Ambulatory Visit (HOSPITAL_COMMUNITY)
Admission: RE | Admit: 2019-07-26 | Discharge: 2019-07-26 | Disposition: A | Discharge status: Home / Self Care | Payer: BC Managed Care – PPO | Source: Ambulatory Visit | Attending: Obstetrics and Gynecology | Admitting: Obstetrics and Gynecology

## 2019-07-26 ENCOUNTER — Encounter: Payer: Self-pay | Admitting: Obstetrics and Gynecology

## 2019-07-26 ENCOUNTER — Other Ambulatory Visit: Payer: Self-pay

## 2019-07-26 ENCOUNTER — Ambulatory Visit (INDEPENDENT_AMBULATORY_CARE_PROVIDER_SITE_OTHER): Payer: BC Managed Care – PPO | Admitting: Obstetrics and Gynecology

## 2019-07-26 VITALS — BP 135/78 | HR 75 | Ht 65.0 in | Wt 146.6 lb

## 2019-07-26 DIAGNOSIS — R87810 Cervical high risk human papillomavirus (HPV) DNA test positive: Secondary | ICD-10-CM | POA: Diagnosis not present

## 2019-07-26 NOTE — Patient Instructions (Signed)

## 2019-07-26 NOTE — Progress Notes (Signed)
   [facility] [address] [place], [place]  [address] Phone:  [phone]   Fax:  [phone] Subjective:     Cathy [name] is a 63 y.o. woman who comes in today for a  pap smear only. She was referred by her PCP.  Her most recent annual exam was virtually on 06/22/2019. Her most recent Pap smear was on 03/11/2018 and showed atypical squamous cellularity of undetermined significance (ASCUS), but HR HPV was negative. Previous abnormal Pap smears: yes - 2018 with NILM but HR HPV+. Contraception: post menopausal status.  Betsua was previously a patient of Dr. Hassell Done Defrancesco at [facility] who retired last year.   The following portions of the patient's history were reviewed and updated as appropriate: allergies, current medications, past family history, past medical history, past social history, past surgical history and problem list.  Review of Systems A comprehensive review of systems was negative.   Objective:    BP 135/78   Pulse 75   Ht 5\' 5"  (1.651 m)   Wt 146 lb 9.6 oz (66.5 kg)   LMP 01/11/2017 (Approximate)   BMI 24.40 kg/m  Pelvic Exam: cervix normal in appearance, external genitalia normal and vagina normal without discharge. Pap smear obtained.   Assessment:   History of abnormal pap smear.   Plan:   Discussed with patient that since her HPV testing was negative last year, that she did not necessarily need to repeat her pap smear this year, however in light of patient having an abnormal pap smear for the past 2 years (and 2018 with positive HPV), patient desires to have pap smear repeated today. Follow up in 1 year, or as indicated by Pap results.     Rubie Maid, MD [facility]

## 2019-07-26 NOTE — Progress Notes (Signed)
Pt present for pap smear only. Pt stated that she was doing well no problems at this time.

## 2019-07-27 NOTE — Discharge Instructions (Signed)

## 2019-07-28 ENCOUNTER — Other Ambulatory Visit
Admission: RE | Admit: 2019-07-28 | Discharge: 2019-07-28 | Disposition: A | Discharge status: Home / Self Care | Payer: BC Managed Care – PPO | Source: Ambulatory Visit | Attending: Gastroenterology | Admitting: Gastroenterology

## 2019-07-28 DIAGNOSIS — Z20828 Contact with and (suspected) exposure to other viral communicable diseases: Secondary | ICD-10-CM | POA: Insufficient documentation

## 2019-07-28 DIAGNOSIS — Z01812 Encounter for preprocedural laboratory examination: Secondary | ICD-10-CM | POA: Diagnosis not present

## 2019-07-28 NOTE — Anesthesia Preprocedure Evaluation (Addendum)
Anesthesia Evaluation  Patient identified by MRN, date of birth, ID band Patient awake    Reviewed: Allergy & Precautions, NPO status , Patient's Chart, lab work & pertinent test results  History of Anesthesia Complications Negative for: history of anesthetic complications  Airway Mallampati: II  TM Distance: >3 FB Neck ROM: Full    Dental   Pulmonary    breath sounds clear to auscultation       Cardiovascular (-) angina(-) DOE  Rhythm:Regular Rate:Normal   HLD   Neuro/Psych PSYCHIATRIC DISORDERS Anxiety Depression    GI/Hepatic neg GERD  ,  Endo/Other    Renal/GU      Musculoskeletal  (+) Arthritis ,   Abdominal   Peds  Hematology   Anesthesia Other Findings CLL  Reproductive/Obstetrics                            Anesthesia Physical Anesthesia Plan  ASA: II  Anesthesia Plan: General   Post-op Pain Management:    Induction: Intravenous  PONV Risk Score and Plan: 3 and Propofol infusion, TIVA and Treatment may vary due to age or medical condition  Airway Management Planned: Natural Airway and Nasal Cannula  Additional Equipment:   Intra-op Plan:   Post-operative Plan:   Informed Consent: I have reviewed the patients History and Physical, chart, labs and discussed the procedure including the risks, benefits and alternatives for the proposed anesthesia with the patient or authorized representative who has indicated his/her understanding and acceptance.       Plan Discussed with: CRNA and Anesthesiologist  Anesthesia Plan Comments:         Anesthesia Quick Evaluation

## 2019-07-29 LAB — CYTOLOGY - PAP
Comment: NEGATIVE
Diagnosis: NEGATIVE
High risk HPV: NEGATIVE

## 2019-07-29 LAB — SARS CORONAVIRUS 2 (TAT 6-24 HRS): SARS Coronavirus 2: NEGATIVE

## 2019-08-01 ENCOUNTER — Other Ambulatory Visit: Payer: Self-pay

## 2019-08-01 ENCOUNTER — Ambulatory Visit
Admission: RE | Admit: 2019-08-01 | Discharge: 2019-08-01 | Disposition: A | Discharge status: Home / Self Care | Payer: BC Managed Care – PPO | Attending: Gastroenterology | Admitting: Gastroenterology

## 2019-08-01 ENCOUNTER — Ambulatory Visit: Payer: BC Managed Care – PPO | Admitting: Anesthesiology

## 2019-08-01 ENCOUNTER — Encounter: Payer: Self-pay | Admitting: Gastroenterology

## 2019-08-01 ENCOUNTER — Encounter
Admission: RE | Disposition: A | Discharge status: Home / Self Care | Payer: Self-pay | Source: Home / Self Care | Attending: Gastroenterology

## 2019-08-01 DIAGNOSIS — Z856 Personal history of leukemia: Secondary | ICD-10-CM | POA: Insufficient documentation

## 2019-08-01 DIAGNOSIS — Z1211 Encounter for screening for malignant neoplasm of colon: Secondary | ICD-10-CM | POA: Diagnosis not present

## 2019-08-01 DIAGNOSIS — Z800 Family history of malignant neoplasm of digestive organs: Secondary | ICD-10-CM | POA: Diagnosis not present

## 2019-08-01 DIAGNOSIS — K640 First degree hemorrhoids: Secondary | ICD-10-CM | POA: Insufficient documentation

## 2019-08-01 DIAGNOSIS — Z9221 Personal history of antineoplastic chemotherapy: Secondary | ICD-10-CM | POA: Diagnosis not present

## 2019-08-01 HISTORY — DX: Other cervical disc degeneration, unspecified cervical region: M50.30

## 2019-08-01 HISTORY — PX: COLONOSCOPY WITH PROPOFOL: SHX5780

## 2019-08-01 SURGERY — COLONOSCOPY WITH PROPOFOL
Anesthesia: General

## 2019-08-01 MED ORDER — ONDANSETRON HCL 4 MG/2ML IJ SOLN: 4.0000 mg | Freq: Once | INTRAMUSCULAR | Status: DC | PRN

## 2019-08-01 MED ORDER — STERILE WATER FOR IRRIGATION IR SOLN
Status: DC | PRN
  Administered 2019-08-01: 10:00:00 50 mL

## 2019-08-01 MED ORDER — LIDOCAINE HCL (CARDIAC) PF 100 MG/5ML IV SOSY
PREFILLED_SYRINGE | INTRAVENOUS | Status: DC | PRN
  Administered 2019-08-01: 30 mg via INTRAVENOUS

## 2019-08-01 MED ORDER — LACTATED RINGERS IV SOLN
100.0000 mL/h | INTRAVENOUS | Status: DC
  Administered 2019-08-01: 100 mL/h via INTRAVENOUS

## 2019-08-01 MED ORDER — ACETAMINOPHEN 10 MG/ML IV SOLN: 1000.0000 mg | Freq: Once | INTRAVENOUS | Status: DC | PRN

## 2019-08-01 MED ORDER — PROPOFOL 10 MG/ML IV BOLUS
INTRAVENOUS | Status: DC | PRN
  Administered 2019-08-01: 120 mg via INTRAVENOUS
  Administered 2019-08-01: 20 mg via INTRAVENOUS
  Administered 2019-08-01: 40 mg via INTRAVENOUS
  Administered 2019-08-01 (×3): 30 mg via INTRAVENOUS
  Administered 2019-08-01: 20 mg via INTRAVENOUS
  Administered 2019-08-01: 30 mg via INTRAVENOUS

## 2019-08-01 SURGICAL SUPPLY — 24 items

## 2019-08-01 NOTE — Transfer of Care (Signed)
Immediate Anesthesia Transfer of Care Note  Patient: Cathy [name]  Procedure(s) Performed: COLONOSCOPY WITH PROPOFOL (N/A )  Patient Location: PACU  Anesthesia Type: General  Level of Consciousness: awake, alert  and patient cooperative  Airway and Oxygen Therapy: Patient Spontanous Breathing and Patient connected to supplemental oxygen  Post-op Assessment: Post-op Vital signs reviewed, Patient's Cardiovascular Status Stable, Respiratory Function Stable, Patent Airway and No signs of Nausea or vomiting  Post-op Vital Signs: Reviewed and stable  Complications: No apparent anesthesia complications

## 2019-08-01 NOTE — Op Note (Signed)
[facility] Gastroenterology Patient Name: Cathy [name] Procedure Date: 08/01/2019 9:09 AM MRN: [id number] Account #: [id number] Date of Birth: 02/03/1956 Admit Type: Outpatient Age: 63 Room: [facility] OR ROOM 01 Gender: Female Note Status: Finalized Procedure:             Colonoscopy Indications:           Screening for colorectal malignant neoplasm Providers:             Lucilla Lame MD, MD Referring MD:          Nino Glow Mclean-Scocuzza MD, MD (Referring MD) Medicines:             Propofol per Anesthesia Complications:         No immediate complications. Procedure:             Pre-Anesthesia Assessment:                        - Prior to the procedure, a History and Physical was                         performed, and patient medications and allergies were                         reviewed. The patient's tolerance of previous                         anesthesia was also reviewed. The risks and benefits                         of the procedure and the sedation options and risks                         were discussed with the patient. All questions were                         answered, and informed consent was obtained. Prior                         Anticoagulants: The patient has taken no previous                         anticoagulant or antiplatelet agents. ASA Grade                         Assessment: II - A patient with mild systemic disease.                         After reviewing the risks and benefits, the patient                         was deemed in satisfactory condition to undergo the                         procedure.                        After obtaining informed consent, the colonoscope was  passed under direct vision. Throughout the procedure,                         the patient's blood pressure, pulse, and oxygen                         saturations were monitored continuously. The was                         introduced through the  anus and advanced to the the                         cecum, identified by appendiceal orifice and ileocecal                         valve. The colonoscopy was performed without                         difficulty. The patient tolerated the procedure well.                         The quality of the bowel preparation was excellent. Findings:      The perianal and digital rectal examinations were normal.      Non-bleeding internal hemorrhoids were found during retroflexion. The       hemorrhoids were Grade I (internal hemorrhoids that do not prolapse).      The terminal ileum appeared normal. Impression:            - Non-bleeding internal hemorrhoids.                        - The examined portion of the ileum was normal.                        - No specimens collected. Recommendation:        - Discharge patient to home.                        - Resume previous diet.                        - Continue present medications.                        - Repeat colonoscopy in 10 years for screening unless                         any change in family history or lower GI problems. Procedure Code(s):     --- Professional ---                        [phone], Colonoscopy, flexible; diagnostic, including                         collection of specimen(s) by brushing or washing, when                         performed (separate procedure) Diagnosis Code(s):     --- Professional ---  Z12.11, Encounter for screening for malignant neoplasm                         of colon CPT copyright 2019 American Medical Association. All rights reserved. The codes documented in this report are preliminary and upon coder review may  be revised to meet current compliance requirements. Lucilla Lame MD, MD 08/01/2019 9:47:50 AM This report has been signed electronically. Number of Addenda: 0 Note Initiated On: 08/01/2019 9:09 AM Scope Withdrawal Time: 0 hours 9 minutes 11 seconds  Total Procedure Duration: 0  hours 14 minutes 33 seconds  Estimated Blood Loss:  Estimated blood loss: none.      [facility]

## 2019-08-01 NOTE — H&P (Signed)
Lucilla Lame, MD [facility] [address], [place], [place] [address] Phone: [phone] Fax : [phone]  Primary Care Physician:  McLean-Scocuzza, Nino Glow, MD Primary Gastroenterologist:  Dr. Allen Norris  Pre-Procedure History & Physical: HPI:  Cathy [name] is a 63 y.o. female is here for a screening colonoscopy.   Past Medical History:  Diagnosis Date  . Anxiety   . Chest pain   . Chicken pox   . CLL (chronic lymphocytic leukemia) ([place])    dx'ed 2015   . Complication of anesthesia    woke up with severe cramps in extremities (26 years ago)  . DDD (degenerative disc disease), cervical   . DUB (dysfunctional uterine bleeding)    s/p D&C Dr. Enzo Bi   . Dysthymia   . Hyperlipidemia   . Influenza    history of x 2   . Personal history of chemotherapy   . Splenomegaly    hx of  . Thyroid nodule     Past Surgical History:  Procedure Laterality Date  . HYSTEROSCOPY W/D&C N/A 06/02/2016   Procedure: DILATATION AND CURETTAGE /HYSTEROSCOPY 2/2 DUB;  Surgeon: Brayton Mars, MD;  Location: [facility] ORS;  Service: Gynecology;  Laterality: N/A;  . TUBAL LIGATION      Prior to Admission medications   Medication Sig Start Date End Date Taking? Authorizing Provider  acyclovir (ZOVIRAX) 400 MG tablet Take 1 tablet (400 mg total) by mouth daily. 09/15/18  Yes Lloyd Huger, MD  b complex vitamins capsule Take 1 capsule by mouth daily.   Yes [provider]  Cholecalciferol (VITAMIN D3) 5000 units TABS Take by mouth daily.   Yes [provider]  meloxicam (MOBIC) 7.5 MG tablet Take 1 tablet (7.5 mg total) by mouth daily as needed for pain. 04/06/18  Yes Lloyd Huger, MD    Allergies as of 06/22/2019  . (No Known Allergies)    Family History  Problem Relation Age of Onset  . Stroke Mother 57       CVA  . Arthritis Mother   . Deep vein thrombosis Brother 45       recurrent DVTs.  . Stroke Maternal Grandmother   . Colon cancer Maternal  Grandmother   . Cancer Maternal Grandmother        colon cancer dx'ed 44s   . Stroke Maternal Grandfather   . Diabetes Maternal Grandfather   . Diabetes Sister   . Breast cancer Neg Hx   . Ovarian cancer Neg Hx     Social History   Socioeconomic History  . Marital status: Divorced    Spouse name: Not on file  . Number of children: Not on file  . Years of education: Not on file  . Highest education level: Not on file  Occupational History  . Not on file  Tobacco Use  . Smoking status: Never Smoker  . Smokeless tobacco: Never Used  Substance and Sexual Activity  . Alcohol use: Not Currently    Comment: occasionally   . Drug use: No  . Sexual activity: Not Currently    Birth control/protection: Surgical  Other Topics Concern  . Not on file  Social History Narrative   B.S    Works at [employer]    3 kids (2 girls and 1 boy) and grandchildren    Social Determinants of Health   Financial Resource Strain:   . Difficulty of Paying Living Expenses: Not on file  Food Insecurity:   . Worried  About Running Out of Food in the Last Year: Not on file  . Ran Out of Food in the Last Year: Not on file  Transportation Needs:   . Lack of Transportation (Medical): Not on file  . Lack of Transportation (Non-Medical): Not on file  Physical Activity:   . Days of Exercise per Week: Not on file  . Minutes of Exercise per Session: Not on file  Stress:   . Feeling of Stress : Not on file  Social Connections:   . Frequency of Communication with Friends and Family: Not on file  . Frequency of Social Gatherings with Friends and Family: Not on file  . Attends Religious Services: Not on file  . Active Member of Clubs or Organizations: Not on file  . Attends Archivist Meetings: Not on file  . Marital Status: Not on file  Intimate Partner Violence:   . Fear of Current or Ex-Partner: Not on file  . Emotionally Abused: Not on file  . Physically Abused: Not on file  .  Sexually Abused: Not on file    Review of Systems: See HPI, otherwise negative ROS  Physical Exam: BP 116/62   Pulse 77   Temp 97.7 F (36.5 C) (Temporal)   Resp 16   Ht 5\' 5"  (1.651 m)   Wt 64.7 kg   LMP 01/11/2017 (Approximate)   SpO2 100%   BMI 23.75 kg/m  General:   Alert,  pleasant and cooperative in NAD Head:  Normocephalic and atraumatic. Neck:  Supple; no masses or thyromegaly. Lungs:  Clear throughout to auscultation.    Heart:  Regular rate and rhythm. Abdomen:  Soft, nontender and nondistended. Normal bowel sounds, without guarding, and without rebound.   Neurologic:  Alert and  oriented x4;  grossly normal neurologically.  Impression/Plan: [name] is now here to undergo a screening colonoscopy.  Risks, benefits, and alternatives regarding colonoscopy have been reviewed with the patient.  Questions have been answered.  All parties agreeable.

## 2019-08-01 NOTE — Anesthesia Postprocedure Evaluation (Signed)
Anesthesia Post Note  Patient: Cathy [name]  Procedure(s) Performed: COLONOSCOPY WITH PROPOFOL (N/A )     Patient location during evaluation: PACU Anesthesia Type: General Level of consciousness: awake and alert Pain management: pain level controlled Vital Signs Assessment: post-procedure vital signs reviewed and stable Respiratory status: spontaneous breathing, nonlabored ventilation, respiratory function stable and patient connected to nasal cannula oxygen Cardiovascular status: blood pressure returned to baseline and stable Postop Assessment: no apparent nausea or vomiting Anesthetic complications: no    Olsen Mccutchan A  Daryan Buell

## 2019-08-01 NOTE — Anesthesia Procedure Notes (Signed)
Date/Time: 08/01/2019 9:26 AM Performed by: Cameron Ali, CRNA Pre-anesthesia Checklist: Patient identified, Emergency Drugs available, Suction available, Timeout performed and Patient being monitored Patient Re-evaluated:Patient Re-evaluated prior to induction Oxygen Delivery Method: Nasal cannula Placement Confirmation: positive ETCO2

## 2019-08-02 ENCOUNTER — Encounter: Payer: Self-pay | Admitting: *Deleted

## 2019-08-10 DIAGNOSIS — E559 Vitamin D deficiency, unspecified: Secondary | ICD-10-CM | POA: Diagnosis not present

## 2019-08-10 DIAGNOSIS — Z13220 Encounter for screening for lipoid disorders: Secondary | ICD-10-CM | POA: Diagnosis not present

## 2019-08-10 DIAGNOSIS — Z1329 Encounter for screening for other suspected endocrine disorder: Secondary | ICD-10-CM | POA: Diagnosis not present

## 2019-08-11 ENCOUNTER — Other Ambulatory Visit: Payer: Self-pay | Admitting: Internal Medicine

## 2019-08-11 DIAGNOSIS — N390 Urinary tract infection, site not specified: Secondary | ICD-10-CM

## 2019-08-11 LAB — LIPID PANEL
Chol/HDL Ratio: 3.2 ratio (ref 0.0–4.4)
Cholesterol, Total: 218 mg/dL — ABNORMAL HIGH (ref 100–199)
HDL: 68 mg/dL (ref >39)
LDL Chol Calc (NIH): 140 mg/dL — ABNORMAL HIGH (ref 0–99)
Triglycerides: 57 mg/dL (ref 0–149)
VLDL Cholesterol Cal: 10 mg/dL (ref 5–40)

## 2019-08-11 LAB — VITAMIN D 25 HYDROXY (VIT D DEFICIENCY, FRACTURES): Vit D, 25-Hydroxy: 52.9 ng/mL (ref 30.0–100.0)

## 2019-08-11 LAB — TSH: TSH: 2.35 u[IU]/mL (ref 0.450–4.500)

## 2019-08-16 ENCOUNTER — Ambulatory Visit (INDEPENDENT_AMBULATORY_CARE_PROVIDER_SITE_OTHER): Payer: BC Managed Care – PPO | Admitting: Internal Medicine

## 2019-08-16 ENCOUNTER — Encounter: Payer: Self-pay | Admitting: Internal Medicine

## 2019-08-16 ENCOUNTER — Other Ambulatory Visit: Payer: Self-pay

## 2019-08-16 VITALS — Ht 65.0 in | Wt 148.0 lb

## 2019-08-16 DIAGNOSIS — Z0000 Encounter for general adult medical examination without abnormal findings: Secondary | ICD-10-CM | POA: Insufficient documentation

## 2019-08-16 DIAGNOSIS — E785 Hyperlipidemia, unspecified: Secondary | ICD-10-CM

## 2019-08-16 DIAGNOSIS — M5416 Radiculopathy, lumbar region: Secondary | ICD-10-CM

## 2019-08-16 DIAGNOSIS — M79642 Pain in left hand: Secondary | ICD-10-CM

## 2019-08-16 DIAGNOSIS — C9110 Chronic lymphocytic leukemia of B-cell type not having achieved remission: Secondary | ICD-10-CM

## 2019-08-16 DIAGNOSIS — M2550 Pain in unspecified joint: Secondary | ICD-10-CM | POA: Diagnosis not present

## 2019-08-16 DIAGNOSIS — M79672 Pain in left foot: Secondary | ICD-10-CM

## 2019-08-16 DIAGNOSIS — R768 Other specified abnormal immunological findings in serum: Secondary | ICD-10-CM | POA: Insufficient documentation

## 2019-08-16 DIAGNOSIS — M79641 Pain in right hand: Secondary | ICD-10-CM | POA: Diagnosis not present

## 2019-08-16 DIAGNOSIS — N3000 Acute cystitis without hematuria: Secondary | ICD-10-CM

## 2019-08-16 DIAGNOSIS — M79671 Pain in right foot: Secondary | ICD-10-CM

## 2019-08-16 NOTE — Patient Instructions (Addendum)
Back Exercises The following exercises strengthen the muscles that help to support the trunk and back. They also help to keep the lower back flexible. Doing these exercises can help to prevent back pain or lessen existing pain.  If you have back pain or discomfort, try doing these exercises 2-3 times each day or as told by your health care provider.  As your pain improves, do them once each day, but increase the number of times that you repeat the steps for each exercise (do more repetitions).  To prevent the recurrence of back pain, continue to do these exercises once each day or as told by your health care provider. Do exercises exactly as told by your health care provider and adjust them as directed. It is normal to feel mild stretching, pulling, tightness, or discomfort as you do these exercises, but you should stop right away if you feel sudden pain or your pain gets worse. Exercises Single knee to chest Repeat these steps 3-5 times for each leg: 1. Lie on your back on a firm bed or the floor with your legs extended. 2. Bring one knee to your chest. Your other leg should stay extended and in contact with the floor. 3. Hold your knee in place by grabbing your knee or thigh with both hands and hold. 4. Pull on your knee until you feel a gentle stretch in your lower back or buttocks. 5. Hold the stretch for 10-30 seconds. 6. Slowly release and straighten your leg. Pelvic tilt Repeat these steps 5-10 times: 1. Lie on your back on a firm bed or the floor with your legs extended. 2. Bend your knees so they are pointing toward the ceiling and your feet are flat on the floor. 3. Tighten your lower abdominal muscles to press your lower back against the floor. This motion will tilt your pelvis so your tailbone points up toward the ceiling instead of pointing to your feet or the floor. 4. With gentle tension and even breathing, hold this position for 5-10 seconds. Cat-cow Repeat these steps until  your lower back becomes more flexible: 1. Get into a hands-and-knees position on a firm surface. Keep your hands under your shoulders, and keep your knees under your hips. You may place padding under your knees for comfort. 2. Let your head hang down toward your chest. Contract your abdominal muscles and point your tailbone toward the floor so your lower back becomes rounded like the back of a cat. 3. Hold this position for 5 seconds. 4. Slowly lift your head, let your abdominal muscles relax and point your tailbone up toward the ceiling so your back forms a sagging arch like the back of a cow. 5. Hold this position for 5 seconds.  Press-ups Repeat these steps 5-10 times: 1. Lie on your abdomen (face-down) on the floor. 2. Place your palms near your head, about shoulder-width apart. 3. Keeping your back as relaxed as possible and keeping your hips on the floor, slowly straighten your arms to raise the top half of your body and lift your shoulders. Do not use your back muscles to raise your upper torso. You may adjust the placement of your hands to make yourself more comfortable. 4. Hold this position for 5 seconds while you keep your back relaxed. 5. Slowly return to lying flat on the floor.  Bridges Repeat these steps 10 times: 1. Lie on your back on a firm surface. 2. Bend your knees so they are pointing toward the ceiling and   your feet are flat on the floor. Your arms should be flat at your sides, next to your body. 3. Tighten your buttocks muscles and lift your buttocks off the floor until your waist is at almost the same height as your knees. You should feel the muscles working in your buttocks and the back of your thighs. If you do not feel these muscles, slide your feet 1-2 inches farther away from your buttocks. 4. Hold this position for 3-5 seconds. 5. Slowly lower your hips to the starting position, and allow your buttocks muscles to relax completely. If this exercise is too easy, try  doing it with your arms crossed over your chest. Abdominal crunches Repeat these steps 5-10 times: 1. Lie on your back on a firm bed or the floor with your legs extended. 2. Bend your knees so they are pointing toward the ceiling and your feet are flat on the floor. 3. Cross your arms over your chest. 4. Tip your chin slightly toward your chest without bending your neck. 5. Tighten your abdominal muscles and slowly raise your trunk (torso) high enough to lift your shoulder blades a tiny bit off the floor. Avoid raising your torso higher than that because it can put too much stress on your low back and does not help to strengthen your abdominal muscles. 6. Slowly return to your starting position. Back lifts Repeat these steps 5-10 times: 1. Lie on your abdomen (face-down) with your arms at your sides, and rest your forehead on the floor. 2. Tighten the muscles in your legs and your buttocks. 3. Slowly lift your chest off the floor while you keep your hips pressed to the floor. Keep the back of your head in line with the curve in your back. Your eyes should be looking at the floor. 4. Hold this position for 3-5 seconds. 5. Slowly return to your starting position. Contact a health care provider if:  Your back pain or discomfort gets much worse when you do an exercise.  Your worsening back pain or discomfort does not lessen within 2 hours after you exercise. If you have any of these problems, stop doing these exercises right away. Do not do them again unless your health care provider says that you can. Get help right away if:  You develop sudden, severe back pain. If this happens, stop doing the exercises right away. Do not do them again unless your health care provider says that you can. This information is not intended to replace advice given to you by your health care provider. Make sure you discuss any questions you have with your health care provider. Document Released: 09/11/2004 Document  Revised: 12/09/2018 Document Reviewed: 05/06/2018 Elsevier Patient Education  [place].  High Cholesterol  High cholesterol is a condition in which the blood has high levels of a white, waxy, fat-like substance (cholesterol). The human body needs small amounts of cholesterol. The liver makes all the cholesterol that the body needs. Extra (excess) cholesterol comes from the food that we eat. Cholesterol is carried from the liver by the blood through the blood vessels. If you have high cholesterol, deposits (plaques) may build up on the walls of your blood vessels (arteries). Plaques make the arteries narrower and stiffer. Cholesterol plaques increase your risk for heart attack and stroke. Work with your health care provider to keep your cholesterol levels in a healthy range. What increases the risk? This condition is more likely to develop in people who:  Eat foods that  are high in animal fat (saturated fat) or cholesterol.  Are overweight.  Are not getting enough exercise.  Have a family history of high cholesterol. What are the signs or symptoms? There are no symptoms of this condition. How is this diagnosed? This condition may be diagnosed from the results of a blood test.  If you are older than age 72, your health care provider may check your cholesterol every 4-6 years.  You may be checked more often if you already have high cholesterol or other risk factors for heart disease. The blood test for cholesterol measures:  "Bad" cholesterol (LDL cholesterol). This is the main type of cholesterol that causes heart disease. The desired level for LDL is less than 100.  "Good" cholesterol (HDL cholesterol). This type helps to protect against heart disease by cleaning the arteries and carrying the LDL away. The desired level for HDL is 60 or higher.  Triglycerides. These are fats that the body can store or burn for energy. The desired number for triglycerides is lower than  150.  Total cholesterol. This is a measure of the total amount of cholesterol in your blood, including LDL cholesterol, HDL cholesterol, and triglycerides. A healthy number is less than 200. How is this treated? This condition is treated with diet changes, lifestyle changes, and medicines. Diet changes  This may include eating more whole grains, fruits, vegetables, nuts, and fish.  This may also include cutting back on red meat and foods that have a lot of added sugar. Lifestyle changes  Changes may include getting at least 40 minutes of aerobic exercise 3 times a week. Aerobic exercises include walking, biking, and swimming. Aerobic exercise along with a healthy diet can help you maintain a healthy weight.  Changes may also include quitting smoking. Medicines  Medicines are usually given if diet and lifestyle changes have failed to reduce your cholesterol to healthy levels.  Your health care provider may prescribe a statin medicine. Statin medicines have been shown to reduce cholesterol, which can reduce the risk of heart disease. Follow these instructions at home: Eating and drinking If told by your health care provider:  Eat chicken (without skin), fish, veal, shellfish, ground [place] breast, and round or loin cuts of red meat.  Do not eat fried foods or fatty meats, such as hot dogs and salami.  Eat plenty of fruits, such as apples.  Eat plenty of vegetables, such as broccoli, potatoes, and carrots.  Eat beans, peas, and lentils.  Eat grains such as barley, rice, couscous, and bulgur wheat.  Eat pasta without cream sauces.  Use skim or nonfat milk, and eat low-fat or nonfat yogurt and cheeses.  Do not eat or drink whole milk, cream, ice cream, egg yolks, or hard cheeses.  Do not eat stick margarine or tub margarines that contain trans fats (also called partially hydrogenated oils).  Do not eat saturated tropical oils, such as coconut oil and palm oil.  Do not eat  cakes, cookies, crackers, or other baked goods that contain trans fats.  General instructions  Exercise as directed by your health care provider. Increase your activity level with activities such as gardening, walking, and taking the stairs.  Take over-the-counter and prescription medicines only as told by your health care provider.  Do not use any products that contain nicotine or tobacco, such as cigarettes and e-cigarettes. If you need help quitting, ask your health care provider.  Keep all follow-up visits as told by your health care provider. This is  important. Contact a health care provider if:  You are struggling to maintain a healthy diet or weight.  You need help to start on an exercise program.  You need help to stop smoking. Get help right away if:  You have chest pain.  You have trouble breathing. This information is not intended to replace advice given to you by your health care provider. Make sure you discuss any questions you have with your health care provider. Document Released: 08/04/2005 Document Revised: 08/07/2017 Document Reviewed: 02/02/2016 Elsevier Patient Education  [place].  Cholesterol Content in Foods Cholesterol is a waxy, fat-like substance that helps to carry fat in the blood. The body needs cholesterol in small amounts, but too much cholesterol can cause damage to the arteries and heart. Most people should eat less than 200 milligrams (mg) of cholesterol a day. Foods with cholesterol  Cholesterol is found in animal-based foods, such as meat, seafood, and dairy. Generally, low-fat dairy and lean meats have less cholesterol than full-fat dairy and fatty meats. The milligrams of cholesterol per serving (mg per serving) of common cholesterol-containing foods are listed below. Meat and other proteins  Egg -- one large whole egg has 186 mg.  Veal shank -- 4 oz has 141 mg.  Lean ground [place] (93% lean) -- 4 oz has 118 mg.  Fat-trimmed  lamb loin -- 4 oz has 106 mg.  Lean ground beef (90% lean) -- 4 oz has 100 mg.  Lobster -- 3.5 oz has 90 mg.  Pork loin chops -- 4 oz has 86 mg.  Canned salmon -- 3.5 oz has 83 mg.  Fat-trimmed beef top loin -- 4 oz has 78 mg.  Frankfurter -- 1 frank (3.5 oz) has 77 mg.  Crab -- 3.5 oz has 71 mg.  Roasted chicken without skin, white meat -- 4 oz has 66 mg.  Light bologna -- 2 oz has 45 mg.  Deli-cut [place] -- 2 oz has 31 mg.  Canned tuna -- 3.5 oz has 31 mg.  Berniece Salines -- 1 oz has 29 mg.  Oysters and mussels (raw) -- 3.5 oz has 25 mg.  Mackerel -- 1 oz has 22 mg.  Trout -- 1 oz has 20 mg.  Pork sausage -- 1 link (1 oz) has 17 mg.  Salmon -- 1 oz has 16 mg.  Tilapia -- 1 oz has 14 mg. Dairy  Soft-serve ice cream --  cup (4 oz) has 103 mg.  Whole-milk yogurt -- 1 cup (8 oz) has 29 mg.  Cheddar cheese -- 1 oz has 28 mg.  American cheese -- 1 oz has 28 mg.  Whole milk -- 1 cup (8 oz) has 23 mg.  2% milk -- 1 cup (8 oz) has 18 mg.  Cream cheese -- 1 tablespoon (Tbsp) has 15 mg.  Cottage cheese --  cup (4 oz) has 14 mg.  Low-fat (1%) milk -- 1 cup (8 oz) has 10 mg.  Sour cream -- 1 Tbsp has 8.5 mg.  Low-fat yogurt -- 1 cup (8 oz) has 8 mg.  Nonfat Greek yogurt -- 1 cup (8 oz) has 7 mg.  Half-and-half cream -- 1 Tbsp has 5 mg. Fats and oils  Cod liver oil -- 1 tablespoon (Tbsp) has 82 mg.  Butter -- 1 Tbsp has 15 mg.  Lard -- 1 Tbsp has 14 mg.  Bacon grease -- 1 Tbsp has 14 mg.  Mayonnaise -- 1 Tbsp has 5-10 mg.  Margarine -- 1 Tbsp has  3-10 mg. Exact amounts of cholesterol in these foods may vary depending on specific ingredients and brands. Foods without cholesterol Most plant-based foods do not have cholesterol unless you combine them with a food that has cholesterol. Foods without cholesterol include:  Grains and cereals.  Vegetables.  Fruits.  Vegetable oils, such as olive, canola, and sunflower oil.  Legumes, such as peas, beans,  and lentils.  Nuts and seeds.  Egg whites. Summary  The body needs cholesterol in small amounts, but too much cholesterol can cause damage to the arteries and heart.  Most people should eat less than 200 milligrams (mg) of cholesterol a day. This information is not intended to replace advice given to you by your health care provider. Make sure you discuss any questions you have with your health care provider. Document Released: 03/31/2017 Document Revised: 07/17/2017 Document Reviewed: 03/31/2017 Elsevier Patient Education  [place]. Zoster Vaccine, Recombinant injection What is this medicine? ZOSTER VACCINE (ZOS ter vak SEEN) is used to prevent shingles in adults 61 years old and over. This vaccine is not used to treat shingles or nerve pain from shingles. This medicine may be used for other purposes; ask your health care provider or pharmacist if you have questions. COMMON BRAND NAME(S): [facility] What should I tell my health care provider before I take this medicine? They need to know if you have any of these conditions:  blood disorders or disease  cancer like leukemia or lymphoma  immune system problems or therapy  an unusual or allergic reaction to vaccines, other medications, foods, dyes, or preservatives  pregnant or trying to get pregnant  breast-feeding How should I use this medicine? This vaccine is for injection in a muscle. It is given by a health care professional. Talk to your pediatrician regarding the use of this medicine in children. This medicine is not approved for use in children. Overdosage: If you think you have taken too much of this medicine contact a poison control center or emergency room at once. NOTE: This medicine is only for you. Do not share this medicine with others. What if I miss a dose? Keep appointments for follow-up (booster) doses as directed. It is important not to miss your dose. Call your doctor or health care professional if  you are unable to keep an appointment. What may interact with this medicine?  medicines that suppress your immune system  medicines to treat cancer  steroid medicines like prednisone or cortisone This list may not describe all possible interactions. Give your health care provider a list of all the medicines, herbs, non-prescription drugs, or dietary supplements you use. Also tell them if you smoke, drink alcohol, or use illegal drugs. Some items may interact with your medicine. What should I watch for while using this medicine? Visit your doctor for regular check ups. This vaccine, like all vaccines, may not fully protect everyone. What side effects may I notice from receiving this medicine? Side effects that you should report to your doctor or health care professional as soon as possible:  allergic reactions like skin rash, itching or hives, swelling of the face, lips, or tongue  breathing problems Side effects that usually do not require medical attention (report these to your doctor or health care professional if they continue or are bothersome):  chills  headache  fever  nausea, vomiting  redness, warmth, pain, swelling or itching at site where injected  tiredness This list may not describe all possible side effects. Call your doctor for  medical advice about side effects. You may report side effects to FDA at 1-800-FDA-1088. Where should I keep my medicine? This vaccine is only given in a clinic, pharmacy, doctor's office, or other health care setting and will not be stored at home. NOTE: This sheet is a summary. It may not cover all possible information. If you have questions about this medicine, talk to your doctor, pharmacist, or health care provider.  2020 Elsevier/Gold Standard (2017-03-16 13:20:30)

## 2019-08-16 NOTE — Progress Notes (Signed)
Telephone Note  I connected with Cathy [name]  on 08/16/19 at  3:50 PM EST by telephone and verified that I am speaking with the correct person using two identifiers.  Location patient: car Location provider:work or home office Persons participating in the virtual visit: patient, provider  I discussed the limitations of evaluation and management by telemedicine and the availability of in person appointments. The patient expressed understanding and agreed to proceed.   HPI:  Annual  1. C/o b/l hand pain L>R and b/l pain in feet  C/o low back pain and muscle spasm and pain radiates to her b/l feet CT ab/pelvis 08/24/17 with lumbar spondylosis, DDD impingement L4/5 and L5/S1  2. HLD she eats healthy and has not been able to exercise as much due to #1. She also avoids gluten and dairy  3. CLL f/u Dr. Brander at [facility]  4. H/o [place] UTI will check urine again 5. C/o loss in ht from 5'3" to 5" when went to h/o appt disc DEXA consider in the future   ROS: See pertinent positives and negatives per HPI. General: wt stable  HEENT; normal hearing CV: no chest pain  Lungs:no sob  GI: no ab pain  MSK: low back pain and hand and feet pain  GU: no issues  Neuro: no h/a  Psych: mood stable  Skin no issues  Past Medical History:  Diagnosis Date  . Anxiety   . Chest pain   . Chicken pox   . CLL (chronic lymphocytic leukemia) (H[facility])    dx'ed 2015   . Complication of anesthesia    woke up with severe cramps in extremities (26 years ago)  . DDD (degenerative disc disease), cervical   . DUB (dysfunctional uterine bleeding)    s/p D&C Dr. Defrancesco   . Dysthymia   . Hyperlipidemia   . Influenza    history of x 2   . Personal history of chemotherapy   . Splenomegaly    hx of  . Thyroid nodule     Past Surgical History:  Procedure Laterality Date  . COLONOSCOPY WITH PROPOFOL N/A 08/01/2019   Procedure: COLONOSCOPY WITH PROPOFOL;  Surgeon: Wohl, Darren, MD;  Location: [facility];  Service: Endoscopy;  Laterality: N/A;  . HYSTEROSCOPY WITH D & C N/A 06/02/2016   Procedure: DILATATION AND CURETTAGE /HYSTEROSCOPY 2/2 DUB;  Surgeon: Martin A Defrancesco, MD;  Location: [facility];  Service: Gynecology;  Laterality: N/A;  . TUBAL LIGATION      Family History  Problem Relation Age of Onset  . Stroke Mother 62       CVA  . Arthritis Mother   . Deep vein thrombosis Brother 45       recurrent DVTs.  . Stroke Maternal Grandmother   . Colon cancer Maternal Grandmother   . Cancer Maternal Grandmother        colon cancer dx'ed 50s   . Stroke Maternal Grandfather   . Diabetes Maternal Grandfather   . Diabetes Sister   . Breast cancer Neg Hx   . 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  acyclovir (ZOVIRAX) 400 MG tablet, Take 1 tablet (400 mg total) by mouth daily., Disp: 30 tablet, Rfl: 3 .  b complex vitamins capsule, Take 1 capsule by mouth daily., Disp: , Rfl:  .    Cholecalciferol (VITAMIN D3) 5000 units TABS, Take by mouth daily., Disp: , Rfl:  .  meloxicam (MOBIC) 7.5 MG tablet, Take 1 tablet (7.5 mg total) by mouth daily as needed for pain., Disp: 90 tablet, Rfl: 0  EXAM: before failed Doxy audio  VITALS per patient if applicable:  GENERAL: alert, oriented, appears well and in no acute distress  HEENT: atraumatic, conjunttiva clear, no obvious abnormalities on inspection of external nose and ears  NECK: normal movements of the head and neck  LUNGS: on inspection no signs of respiratory distress, breathing rate appears normal, no obvious gross SOB, gasping or wheezing  CV: no obvious cyanosis  MS: moves all visible extremities without noticeable abnormality  PSYCH/NEURO: pleasant and cooperative, no obvious depression or anxiety, speech and thought processing grossly intact  ASSESSMENT AND PLAN:  Discussed the  following assessment and plan: annual UTD flu and Tdap  Given Rx shingrix consider shingrix vaccine in the future  Declines hep B vaccine for now Disc MMR titer consider in future  Hep C neg, TB quant neg, rec hep B vaccine ptdeclines  mammo 07/07/2019 neg Consider DEXA with ht shrinkage over time  Colonoscopy Dr. Allen Norris 08/01/19 [facility]   OB/GYN for paps Dr. Enzo Bi saw 10/2016 for colp has fibroids, pap with h/o HPV + high risk per note colp was inadequate  -ASCUS pap h/o HPV pap  -pap 07/26/19 atrophy HPV neg  derm referral consider in the future   Continue healthy diet and exercise   Polyarthralgia (hands/feet) - Plan: Antinuclear Antib (ANA), Rheumatoid Factor (h/o slightly elevated RF in the past) , Uric acid, CYCLIC CITRUL PEPTIDE ANTIBODY, IGG/IGA, Sedimentation rate, C-reactive protein Xray left hand r/o RA   Hyperlipidemia, unspecified hyperlipidemia type -sent cholesterol info rec healthy diet and exercise  Lumbar radiculopathy with spondylosis/DDD impingement L4/5, L5/S1 - Plan: MR Lumbar Spine Wo Contrast Consider PM&R epidural injections, PT pending MRI  Acute cystitis without hematuria - Plan: Urine Culture  CLL (chronic lymphocytic leukemia) ([place]) F/u Dr. Dorothea Ogle H/o [facility]  Holding tx for now  07/18/19 WBC 47.9 H 06/14/19 47.3   [facility] 06/27/2019 tendonitis right wrist given mobic 7.5 bid wrist cock up brace Dr. Raliegh Ip   -we discussed possible serious and likely etiologies, options for evaluation and workup, limitations of telemedicine visit vs in person visit, treatment, treatment risks and precautions. Pt prefers to treat via telemedicine empirically rather then risking or undertaking an in person visit at this moment. Patient agrees to seek prompt in person care if worsening, new symptoms arise, or if is not improving with treatment.   I discussed the assessment and treatment plan with the patient. The patient was provided an opportunity to ask questions  and all were answered. The patient agreed with the plan and demonstrated an understanding of the instructions.   The patient was advised to call back or seek an in-person evaluation if the symptoms worsen or if the condition fails to improve as anticipated.  Time spent 15-20 minutes  Delorise Jackson, MD

## 2019-08-24 ENCOUNTER — Ambulatory Visit: Payer: BC Managed Care – PPO

## 2019-08-24 NOTE — Addendum Note (Signed)
Addended by: Orland Mustard on: 08/24/2019 09:32 AM   Modules accepted: Orders

## 2019-09-05 ENCOUNTER — Other Ambulatory Visit: Payer: Self-pay

## 2019-09-05 ENCOUNTER — Ambulatory Visit
Admission: RE | Admit: 2019-09-05 | Discharge: 2019-09-05 | Disposition: A | Discharge status: Home / Self Care | Payer: BC Managed Care – PPO | Source: Ambulatory Visit | Attending: Internal Medicine | Admitting: Internal Medicine

## 2019-09-05 DIAGNOSIS — M79671 Pain in right foot: Secondary | ICD-10-CM | POA: Insufficient documentation

## 2019-09-05 DIAGNOSIS — M79642 Pain in left hand: Secondary | ICD-10-CM | POA: Insufficient documentation

## 2019-09-05 DIAGNOSIS — M79641 Pain in right hand: Secondary | ICD-10-CM | POA: Diagnosis not present

## 2019-09-05 DIAGNOSIS — M5416 Radiculopathy, lumbar region: Secondary | ICD-10-CM | POA: Insufficient documentation

## 2019-09-05 DIAGNOSIS — M79672 Pain in left foot: Secondary | ICD-10-CM | POA: Insufficient documentation

## 2019-09-05 DIAGNOSIS — M545 Low back pain: Secondary | ICD-10-CM | POA: Diagnosis not present

## 2019-09-06 DIAGNOSIS — M79641 Pain in right hand: Secondary | ICD-10-CM | POA: Diagnosis not present

## 2019-09-06 DIAGNOSIS — N3000 Acute cystitis without hematuria: Secondary | ICD-10-CM | POA: Diagnosis not present

## 2019-09-06 DIAGNOSIS — M2550 Pain in unspecified joint: Secondary | ICD-10-CM | POA: Diagnosis not present

## 2019-09-06 DIAGNOSIS — M79642 Pain in left hand: Secondary | ICD-10-CM | POA: Diagnosis not present

## 2019-09-06 DIAGNOSIS — M79671 Pain in right foot: Secondary | ICD-10-CM | POA: Diagnosis not present

## 2019-09-07 ENCOUNTER — Telehealth: Payer: Self-pay

## 2019-09-07 NOTE — Telephone Encounter (Signed)
Does she want to see a back specialist for injections of steroid in her back?  NO  I would also consider PT? Agreeable? NO for now.  Consider bone density scan of her bones is she agreeable? To check thin or weak bones?  She is ok with order.  Patient wants to know if she can keep receiving mobic PRN she stated she doesn't take it regularly. Patient wanted to know she compared her current diet with the paperwork. She doesn't have to change her diet, but she is trying to workout more so four times a week.

## 2019-09-08 ENCOUNTER — Other Ambulatory Visit: Payer: Self-pay | Admitting: Internal Medicine

## 2019-09-08 DIAGNOSIS — E2839 Other primary ovarian failure: Secondary | ICD-10-CM

## 2019-09-08 DIAGNOSIS — M549 Dorsalgia, unspecified: Secondary | ICD-10-CM

## 2019-09-08 LAB — SEDIMENTATION RATE: Sed Rate: 9 mm/hr (ref 0–40)

## 2019-09-08 LAB — URINE CULTURE: Organism ID, Bacteria: NO GROWTH

## 2019-09-08 LAB — C-REACTIVE PROTEIN: CRP: <1 mg/L (ref 0–10)

## 2019-09-08 LAB — CYCLIC CITRUL PEPTIDE ANTIBODY, IGG/IGA: Cyclic Citrullin Peptide Ab: 5 units (ref 0–19)

## 2019-09-08 LAB — RHEUMATOID FACTOR: Rheumatoid fact SerPl-aCnc: 19 IU/mL — ABNORMAL HIGH (ref 0.0–13.9)

## 2019-09-08 LAB — URIC ACID: Uric Acid: 4.9 mg/dL (ref 3.0–7.2)

## 2019-09-08 LAB — ANA: Anti Nuclear Antibody (ANA): NEGATIVE

## 2019-09-08 MED ORDER — MELOXICAM 7.5 MG PO TABS: 7.5000 mg | ORAL_TABLET | Freq: Every day | ORAL | 1 refills | Status: DC | PRN

## 2019-09-08 NOTE — Telephone Encounter (Signed)
She can call [facility] where she had mammo to schedule dexa Inform pt   Thanks [place]

## 2019-09-12 NOTE — Telephone Encounter (Signed)
Left message for patient to return call back.  

## 2019-09-14 ENCOUNTER — Encounter: Payer: Self-pay | Admitting: Internal Medicine

## 2019-09-21 ENCOUNTER — Encounter: Payer: Self-pay | Admitting: Internal Medicine

## 2019-09-27 DIAGNOSIS — C9110 Chronic lymphocytic leukemia of B-cell type not having achieved remission: Secondary | ICD-10-CM | POA: Diagnosis not present

## 2019-10-18 DIAGNOSIS — R161 Splenomegaly, not elsewhere classified: Secondary | ICD-10-CM | POA: Diagnosis not present

## 2019-10-18 DIAGNOSIS — C9110 Chronic lymphocytic leukemia of B-cell type not having achieved remission: Secondary | ICD-10-CM | POA: Diagnosis not present

## 2019-10-25 DIAGNOSIS — R911 Solitary pulmonary nodule: Secondary | ICD-10-CM | POA: Diagnosis not present

## 2019-10-25 DIAGNOSIS — E875 Hyperkalemia: Secondary | ICD-10-CM | POA: Diagnosis not present

## 2019-10-25 DIAGNOSIS — R110 Nausea: Secondary | ICD-10-CM | POA: Diagnosis not present

## 2019-10-25 DIAGNOSIS — R591 Generalized enlarged lymph nodes: Secondary | ICD-10-CM | POA: Diagnosis not present

## 2019-10-25 DIAGNOSIS — E041 Nontoxic single thyroid nodule: Secondary | ICD-10-CM | POA: Diagnosis not present

## 2019-10-25 DIAGNOSIS — C9110 Chronic lymphocytic leukemia of B-cell type not having achieved remission: Secondary | ICD-10-CM | POA: Diagnosis not present

## 2019-11-28 DIAGNOSIS — Z20822 Contact with and (suspected) exposure to covid-19: Secondary | ICD-10-CM | POA: Diagnosis not present

## 2019-11-28 DIAGNOSIS — C9110 Chronic lymphocytic leukemia of B-cell type not having achieved remission: Secondary | ICD-10-CM | POA: Diagnosis not present

## 2019-11-28 DIAGNOSIS — E883 Tumor lysis syndrome: Secondary | ICD-10-CM | POA: Diagnosis not present

## 2019-11-28 DIAGNOSIS — Z9189 Other specified personal risk factors, not elsewhere classified: Secondary | ICD-10-CM | POA: Diagnosis not present

## 2019-11-28 DIAGNOSIS — Z79899 Other long term (current) drug therapy: Secondary | ICD-10-CM | POA: Diagnosis not present

## 2019-11-28 DIAGNOSIS — E875 Hyperkalemia: Secondary | ICD-10-CM | POA: Diagnosis not present

## 2019-11-28 DIAGNOSIS — T451X5A Adverse effect of antineoplastic and immunosuppressive drugs, initial encounter: Secondary | ICD-10-CM | POA: Diagnosis not present

## 2019-11-29 ENCOUNTER — Telehealth: Payer: Self-pay | Admitting: Obstetrics and Gynecology

## 2019-11-29 MED ORDER — LORAZEPAM 2 MG/ML IJ SOLN
1.00 | INTRAMUSCULAR | Status: DC
Start: ? — End: 2019-11-29

## 2019-11-29 MED ORDER — ACYCLOVIR 400 MG PO TABS
400.00 | ORAL_TABLET | ORAL | Status: DC
Start: 2019-12-01 — End: 2019-11-29

## 2019-11-29 MED ORDER — ALLOPURINOL 300 MG PO TABS
300.00 | ORAL_TABLET | ORAL | Status: DC
Start: 2019-12-02 — End: 2019-11-29

## 2019-11-29 MED ORDER — LORAZEPAM 1 MG PO TABS
1.00 | ORAL_TABLET | ORAL | Status: DC
Start: ? — End: 2019-11-29

## 2019-11-29 MED ORDER — VENETOCLAX 10 MG PO TABS
20.00 | ORAL_TABLET | ORAL | Status: DC
Start: 2019-12-02 — End: 2019-11-29

## 2019-11-29 MED ORDER — PROCHLORPERAZINE EDISYLATE 10 MG/2ML IJ SOLN
10.00 | INTRAMUSCULAR | Status: DC
Start: ? — End: 2019-11-29

## 2019-11-29 MED ORDER — ACETAMINOPHEN 325 MG PO TABS
650.00 | ORAL_TABLET | ORAL | Status: DC
Start: ? — End: 2019-11-29

## 2019-11-29 MED ORDER — GENERIC EXTERNAL MEDICATION
5000.00 | Status: DC
Start: 2019-12-02 — End: 2019-11-29

## 2019-11-29 MED ORDER — PROCHLORPERAZINE MALEATE 10 MG PO TABS
10.00 | ORAL_TABLET | ORAL | Status: DC
Start: ? — End: 2019-11-29

## 2019-11-29 MED ORDER — SODIUM CHLORIDE 0.9 % IV SOLN
INTRAVENOUS | Status: DC
Start: ? — End: 2019-11-29

## 2019-11-29 NOTE — Telephone Encounter (Signed)
[employer] called in and stated that this pt enrolled in a program nurse case managed program and it you have an questions to call [id number] susan. Please advise

## 2019-12-01 MED ORDER — MELATONIN 3 MG PO TABS
3.00 | ORAL_TABLET | ORAL | Status: DC
Start: 2019-12-01 — End: 2019-12-01

## 2019-12-05 DIAGNOSIS — R911 Solitary pulmonary nodule: Secondary | ICD-10-CM | POA: Diagnosis not present

## 2019-12-05 DIAGNOSIS — K5900 Constipation, unspecified: Secondary | ICD-10-CM | POA: Diagnosis not present

## 2019-12-05 DIAGNOSIS — E041 Nontoxic single thyroid nodule: Secondary | ICD-10-CM | POA: Diagnosis not present

## 2019-12-05 DIAGNOSIS — C9110 Chronic lymphocytic leukemia of B-cell type not having achieved remission: Secondary | ICD-10-CM | POA: Diagnosis not present

## 2019-12-05 DIAGNOSIS — E875 Hyperkalemia: Secondary | ICD-10-CM | POA: Diagnosis not present

## 2019-12-05 DIAGNOSIS — E559 Vitamin D deficiency, unspecified: Secondary | ICD-10-CM | POA: Diagnosis not present

## 2019-12-05 DIAGNOSIS — Z5111 Encounter for antineoplastic chemotherapy: Secondary | ICD-10-CM | POA: Diagnosis not present

## 2019-12-05 DIAGNOSIS — K219 Gastro-esophageal reflux disease without esophagitis: Secondary | ICD-10-CM | POA: Insufficient documentation

## 2019-12-05 DIAGNOSIS — R142 Eructation: Secondary | ICD-10-CM | POA: Diagnosis not present

## 2019-12-05 DIAGNOSIS — Z20822 Contact with and (suspected) exposure to covid-19: Secondary | ICD-10-CM | POA: Diagnosis not present

## 2019-12-05 DIAGNOSIS — Z79899 Other long term (current) drug therapy: Secondary | ICD-10-CM | POA: Diagnosis not present

## 2019-12-05 DIAGNOSIS — R110 Nausea: Secondary | ICD-10-CM | POA: Diagnosis not present

## 2019-12-05 DIAGNOSIS — Z9189 Other specified personal risk factors, not elsewhere classified: Secondary | ICD-10-CM | POA: Diagnosis not present

## 2019-12-08 MED ORDER — MELATONIN 3 MG PO TABS
3.00 | ORAL_TABLET | ORAL | Status: DC
Start: 2019-12-08 — End: 2019-12-08

## 2019-12-08 MED ORDER — POLYETHYLENE GLYCOL 3350 17 GM/SCOOP PO POWD
17.00 | ORAL | Status: DC
Start: 2019-12-09 — End: 2019-12-08

## 2019-12-08 MED ORDER — SODIUM CHLORIDE 0.9 % IV SOLN
INTRAVENOUS | Status: DC
Start: ? — End: 2019-12-08

## 2019-12-08 MED ORDER — ALLOPURINOL 300 MG PO TABS
300.00 | ORAL_TABLET | ORAL | Status: DC
Start: 2019-12-09 — End: 2019-12-08

## 2019-12-08 MED ORDER — VENETOCLAX 50 MG PO TABS
50.00 | ORAL_TABLET | ORAL | Status: DC
Start: 2019-12-09 — End: 2019-12-08

## 2019-12-08 MED ORDER — SENNOSIDES-DOCUSATE SODIUM 8.6-50 MG PO TABS
2.00 | ORAL_TABLET | ORAL | Status: DC
Start: 2019-12-08 — End: 2019-12-08

## 2019-12-08 MED ORDER — LORAZEPAM 2 MG/ML IJ SOLN
1.00 | INTRAMUSCULAR | Status: DC
Start: ? — End: 2019-12-08

## 2019-12-08 MED ORDER — ACYCLOVIR 400 MG PO TABS
400.00 | ORAL_TABLET | ORAL | Status: DC
Start: 2019-12-08 — End: 2019-12-08

## 2019-12-08 MED ORDER — PROCHLORPERAZINE EDISYLATE 10 MG/2ML IJ SOLN
10.00 | INTRAMUSCULAR | Status: DC
Start: ? — End: 2019-12-08

## 2019-12-08 MED ORDER — LACTULOSE 10 GM/15ML PO SOLN
30.00 | ORAL | Status: DC
Start: ? — End: 2019-12-08

## 2019-12-08 MED ORDER — GENERIC EXTERNAL MEDICATION
5000.00 | Status: DC
Start: 2019-12-09 — End: 2019-12-08

## 2019-12-08 MED ORDER — PROCHLORPERAZINE MALEATE 10 MG PO TABS
10.00 | ORAL_TABLET | ORAL | Status: DC
Start: ? — End: 2019-12-08

## 2019-12-08 MED ORDER — LORAZEPAM 1 MG PO TABS
1.00 | ORAL_TABLET | ORAL | Status: DC
Start: ? — End: 2019-12-08

## 2019-12-08 MED ORDER — GENERIC EXTERNAL MEDICATION
Status: DC
Start: ? — End: 2019-12-08

## 2019-12-09 NOTE — Telephone Encounter (Signed)
Called Cathy [name] no answer LM via VM to please return my call. Pt's name and dob and office phone number left on vm.

## 2019-12-13 DIAGNOSIS — Z20822 Contact with and (suspected) exposure to covid-19: Secondary | ICD-10-CM | POA: Diagnosis not present

## 2019-12-13 DIAGNOSIS — C9110 Chronic lymphocytic leukemia of B-cell type not having achieved remission: Secondary | ICD-10-CM | POA: Diagnosis not present

## 2019-12-13 DIAGNOSIS — Z79899 Other long term (current) drug therapy: Secondary | ICD-10-CM | POA: Diagnosis not present

## 2019-12-13 NOTE — Telephone Encounter (Signed)
Manuela Schwartz called no answer LM via VM to please return call concerning pt. Office number left on VM along with my name and Pt's name and DOB.

## 2019-12-13 NOTE — Telephone Encounter (Signed)
Manuela Schwartz returned call just to state that she was calling to inform me that the pt was enrolled in a program. Manuela Schwartz stated that she did not want me to follow up with her.

## 2019-12-14 DIAGNOSIS — C9110 Chronic lymphocytic leukemia of B-cell type not having achieved remission: Secondary | ICD-10-CM | POA: Diagnosis not present

## 2019-12-20 DIAGNOSIS — C9110 Chronic lymphocytic leukemia of B-cell type not having achieved remission: Secondary | ICD-10-CM | POA: Diagnosis not present

## 2019-12-21 ENCOUNTER — Ambulatory Visit: Payer: BC Managed Care – PPO | Admitting: Internal Medicine

## 2019-12-27 DIAGNOSIS — C9110 Chronic lymphocytic leukemia of B-cell type not having achieved remission: Secondary | ICD-10-CM | POA: Diagnosis not present

## 2019-12-31 DIAGNOSIS — R590 Localized enlarged lymph nodes: Secondary | ICD-10-CM | POA: Diagnosis not present

## 2019-12-31 DIAGNOSIS — R911 Solitary pulmonary nodule: Secondary | ICD-10-CM | POA: Diagnosis not present

## 2019-12-31 DIAGNOSIS — C9110 Chronic lymphocytic leukemia of B-cell type not having achieved remission: Secondary | ICD-10-CM | POA: Diagnosis not present

## 2019-12-31 DIAGNOSIS — K219 Gastro-esophageal reflux disease without esophagitis: Secondary | ICD-10-CM | POA: Diagnosis not present

## 2019-12-31 DIAGNOSIS — R0789 Other chest pain: Secondary | ICD-10-CM | POA: Diagnosis not present

## 2019-12-31 DIAGNOSIS — R079 Chest pain, unspecified: Secondary | ICD-10-CM | POA: Diagnosis not present

## 2019-12-31 DIAGNOSIS — R9431 Abnormal electrocardiogram [ECG] [EKG]: Secondary | ICD-10-CM | POA: Diagnosis not present

## 2019-12-31 DIAGNOSIS — Z20822 Contact with and (suspected) exposure to covid-19: Secondary | ICD-10-CM | POA: Diagnosis not present

## 2020-01-02 DIAGNOSIS — Z20822 Contact with and (suspected) exposure to covid-19: Secondary | ICD-10-CM | POA: Diagnosis not present

## 2020-01-03 DIAGNOSIS — C9110 Chronic lymphocytic leukemia of B-cell type not having achieved remission: Secondary | ICD-10-CM | POA: Diagnosis not present

## 2020-01-03 DIAGNOSIS — Z9189 Other specified personal risk factors, not elsewhere classified: Secondary | ICD-10-CM | POA: Diagnosis not present

## 2020-01-04 DIAGNOSIS — C9110 Chronic lymphocytic leukemia of B-cell type not having achieved remission: Secondary | ICD-10-CM | POA: Diagnosis not present

## 2020-01-06 ENCOUNTER — Ambulatory Visit: Payer: BC Managed Care – PPO | Admitting: Internal Medicine

## 2020-01-10 DIAGNOSIS — C9110 Chronic lymphocytic leukemia of B-cell type not having achieved remission: Secondary | ICD-10-CM | POA: Diagnosis not present

## 2020-01-11 DIAGNOSIS — C9110 Chronic lymphocytic leukemia of B-cell type not having achieved remission: Secondary | ICD-10-CM | POA: Diagnosis not present

## 2020-01-17 DIAGNOSIS — R413 Other amnesia: Secondary | ICD-10-CM | POA: Diagnosis not present

## 2020-01-17 DIAGNOSIS — R5383 Other fatigue: Secondary | ICD-10-CM | POA: Diagnosis not present

## 2020-01-17 DIAGNOSIS — C9110 Chronic lymphocytic leukemia of B-cell type not having achieved remission: Secondary | ICD-10-CM | POA: Diagnosis not present

## 2020-01-31 DIAGNOSIS — C9110 Chronic lymphocytic leukemia of B-cell type not having achieved remission: Secondary | ICD-10-CM | POA: Diagnosis not present

## 2020-02-17 DIAGNOSIS — C9110 Chronic lymphocytic leukemia of B-cell type not having achieved remission: Secondary | ICD-10-CM | POA: Diagnosis not present

## 2020-02-27 DIAGNOSIS — C9110 Chronic lymphocytic leukemia of B-cell type not having achieved remission: Secondary | ICD-10-CM | POA: Diagnosis not present

## 2020-02-27 DIAGNOSIS — R768 Other specified abnormal immunological findings in serum: Secondary | ICD-10-CM | POA: Diagnosis not present

## 2020-02-27 DIAGNOSIS — D709 Neutropenia, unspecified: Secondary | ICD-10-CM | POA: Diagnosis not present

## 2020-02-28 DIAGNOSIS — R768 Other specified abnormal immunological findings in serum: Secondary | ICD-10-CM | POA: Insufficient documentation

## 2020-02-28 DIAGNOSIS — R7689 Other specified abnormal immunological findings in serum: Secondary | ICD-10-CM | POA: Insufficient documentation

## 2020-03-04 ENCOUNTER — Telehealth: Payer: Self-pay | Admitting: Internal Medicine

## 2020-03-04 ENCOUNTER — Encounter: Payer: Self-pay | Admitting: Internal Medicine

## 2020-03-04 NOTE — Telephone Encounter (Signed)
Pt needs to sch appt to disc what to do about right thyroid nodule In person if she can

## 2020-03-05 NOTE — Telephone Encounter (Signed)
Left message to return call 

## 2020-03-13 DIAGNOSIS — C9110 Chronic lymphocytic leukemia of B-cell type not having achieved remission: Secondary | ICD-10-CM | POA: Diagnosis not present

## 2020-03-15 NOTE — Telephone Encounter (Signed)
Left message to return call. Please schedule patient if calling back in.  Mychart message sent.

## 2020-04-17 DIAGNOSIS — M19072 Primary osteoarthritis, left ankle and foot: Secondary | ICD-10-CM | POA: Diagnosis not present

## 2020-04-17 DIAGNOSIS — M79672 Pain in left foot: Secondary | ICD-10-CM | POA: Diagnosis not present

## 2020-04-17 DIAGNOSIS — Z23 Encounter for immunization: Secondary | ICD-10-CM | POA: Diagnosis not present

## 2020-04-17 DIAGNOSIS — C9110 Chronic lymphocytic leukemia of B-cell type not having achieved remission: Secondary | ICD-10-CM | POA: Diagnosis not present

## 2020-04-22 DIAGNOSIS — Z6823 Body mass index (BMI) 23.0-23.9, adult: Secondary | ICD-10-CM | POA: Diagnosis not present

## 2020-04-22 DIAGNOSIS — N390 Urinary tract infection, site not specified: Secondary | ICD-10-CM | POA: Diagnosis not present

## 2020-04-24 DIAGNOSIS — C9110 Chronic lymphocytic leukemia of B-cell type not having achieved remission: Secondary | ICD-10-CM | POA: Diagnosis not present

## 2020-04-24 DIAGNOSIS — D709 Neutropenia, unspecified: Secondary | ICD-10-CM | POA: Diagnosis not present

## 2020-05-29 DIAGNOSIS — C9112 Chronic lymphocytic leukemia of B-cell type in relapse: Secondary | ICD-10-CM | POA: Diagnosis not present

## 2020-05-29 DIAGNOSIS — R110 Nausea: Secondary | ICD-10-CM | POA: Diagnosis not present

## 2020-05-29 DIAGNOSIS — C9110 Chronic lymphocytic leukemia of B-cell type not having achieved remission: Secondary | ICD-10-CM | POA: Diagnosis not present

## 2020-05-29 DIAGNOSIS — R911 Solitary pulmonary nodule: Secondary | ICD-10-CM | POA: Diagnosis not present

## 2020-05-29 DIAGNOSIS — Z20822 Contact with and (suspected) exposure to covid-19: Secondary | ICD-10-CM | POA: Diagnosis not present

## 2020-05-29 DIAGNOSIS — E041 Nontoxic single thyroid nodule: Secondary | ICD-10-CM | POA: Diagnosis not present

## 2020-05-29 DIAGNOSIS — R5383 Other fatigue: Secondary | ICD-10-CM | POA: Diagnosis not present

## 2020-05-29 DIAGNOSIS — Z79899 Other long term (current) drug therapy: Secondary | ICD-10-CM | POA: Diagnosis not present

## 2020-05-29 DIAGNOSIS — Z23 Encounter for immunization: Secondary | ICD-10-CM | POA: Diagnosis not present

## 2020-07-09 DIAGNOSIS — D229 Melanocytic nevi, unspecified: Secondary | ICD-10-CM | POA: Diagnosis not present

## 2020-07-09 DIAGNOSIS — D492 Neoplasm of unspecified behavior of bone, soft tissue, and skin: Secondary | ICD-10-CM | POA: Diagnosis not present

## 2020-07-09 DIAGNOSIS — L570 Actinic keratosis: Secondary | ICD-10-CM | POA: Diagnosis not present

## 2020-07-09 DIAGNOSIS — L821 Other seborrheic keratosis: Secondary | ICD-10-CM | POA: Diagnosis not present

## 2020-07-09 DIAGNOSIS — D045 Carcinoma in situ of skin of trunk: Secondary | ICD-10-CM | POA: Diagnosis not present

## 2020-07-10 DIAGNOSIS — Z79899 Other long term (current) drug therapy: Secondary | ICD-10-CM | POA: Diagnosis not present

## 2020-07-10 DIAGNOSIS — R220 Localized swelling, mass and lump, head: Secondary | ICD-10-CM | POA: Diagnosis not present

## 2020-07-10 DIAGNOSIS — Y92194 Driveway of other specified residential institution as the place of occurrence of the external cause: Secondary | ICD-10-CM | POA: Diagnosis not present

## 2020-07-10 DIAGNOSIS — W19XXXA Unspecified fall, initial encounter: Secondary | ICD-10-CM | POA: Diagnosis not present

## 2020-07-10 DIAGNOSIS — S0093XA Contusion of unspecified part of head, initial encounter: Secondary | ICD-10-CM | POA: Diagnosis not present

## 2020-07-10 DIAGNOSIS — S0990XA Unspecified injury of head, initial encounter: Secondary | ICD-10-CM | POA: Diagnosis not present

## 2020-07-10 DIAGNOSIS — S199XXA Unspecified injury of neck, initial encounter: Secondary | ICD-10-CM | POA: Diagnosis not present

## 2020-07-10 DIAGNOSIS — R110 Nausea: Secondary | ICD-10-CM | POA: Diagnosis not present

## 2020-07-10 DIAGNOSIS — S0001XA Abrasion of scalp, initial encounter: Secondary | ICD-10-CM | POA: Diagnosis not present

## 2020-07-10 DIAGNOSIS — S0083XA Contusion of other part of head, initial encounter: Secondary | ICD-10-CM | POA: Diagnosis not present

## 2020-07-10 DIAGNOSIS — W01198A Fall on same level from slipping, tripping and stumbling with subsequent striking against other object, initial encounter: Secondary | ICD-10-CM | POA: Diagnosis not present

## 2020-07-10 DIAGNOSIS — S0031XA Abrasion of nose, initial encounter: Secondary | ICD-10-CM | POA: Diagnosis not present

## 2020-07-10 DIAGNOSIS — W010XXA Fall on same level from slipping, tripping and stumbling without subsequent striking against object, initial encounter: Secondary | ICD-10-CM | POA: Diagnosis not present

## 2020-07-10 DIAGNOSIS — E041 Nontoxic single thyroid nodule: Secondary | ICD-10-CM | POA: Diagnosis not present

## 2020-07-10 DIAGNOSIS — R911 Solitary pulmonary nodule: Secondary | ICD-10-CM | POA: Diagnosis not present

## 2020-07-10 DIAGNOSIS — C9110 Chronic lymphocytic leukemia of B-cell type not having achieved remission: Secondary | ICD-10-CM | POA: Diagnosis not present

## 2020-07-10 DIAGNOSIS — R5383 Other fatigue: Secondary | ICD-10-CM | POA: Diagnosis not present

## 2020-07-10 DIAGNOSIS — S0010XA Contusion of unspecified eyelid and periocular area, initial encounter: Secondary | ICD-10-CM | POA: Diagnosis not present

## 2020-07-10 DIAGNOSIS — S0993XA Unspecified injury of face, initial encounter: Secondary | ICD-10-CM | POA: Diagnosis not present

## 2020-07-30 NOTE — Progress Notes (Signed)
Pt present for annual exam. Pt stated that feeling like as quoted not able to pee straight and had some vaginal itching and burning started taking probiotics and noticed an improvement in symptoms.

## 2020-07-30 NOTE — Patient Instructions (Addendum)
Preventive Care 40-62 Years Old, Female Preventive care refers to visits with your health care provider and lifestyle choices that can promote health and wellness. This includes:  A yearly physical exam. This may also be called an annual well check.  Regular dental visits and eye exams.  Immunizations.  Screening for certain conditions.  Healthy lifestyle choices, such as eating a healthy diet, getting regular exercise, not using drugs or products that contain nicotine and tobacco, and limiting alcohol use. What can I expect for my preventive care visit? Physical exam Your health care provider will check your:  Height and weight. This may be used to calculate body mass index (BMI), which tells if you are at a healthy weight.  Heart rate and blood pressure.  Skin for abnormal spots. Counseling Your health care provider may ask you questions about your:  Alcohol, tobacco, and drug use.  Emotional well-being.  Home and relationship well-being.  Sexual activity.  Eating habits.  Work and work environment.  Method of birth control.  Menstrual cycle.  Pregnancy history. What immunizations do I need?  Influenza (flu) vaccine  This is recommended every year. Tetanus, diphtheria, and pertussis (Tdap) vaccine  You may need a Td booster every 10 years. Varicella (chickenpox) vaccine  You may need this if you have not been vaccinated. Zoster (shingles) vaccine  You may need this after age 60. Measles, mumps, and rubella (MMR) vaccine  You may need at least one dose of MMR if you were born in 1957 or later. You may also need a second dose. Pneumococcal conjugate (PCV13) vaccine  You may need this if you have certain conditions and were not previously vaccinated. Pneumococcal polysaccharide (PPSV23) vaccine  You may need one or two doses if you smoke cigarettes or if you have certain conditions. Meningococcal conjugate (MenACWY) vaccine  You may need this if you  have certain conditions. Hepatitis A vaccine  You may need this if you have certain conditions or if you travel or work in places where you may be exposed to hepatitis A. Hepatitis B vaccine  You may need this if you have certain conditions or if you travel or work in places where you may be exposed to hepatitis B. Haemophilus influenzae type b (Hib) vaccine  You may need this if you have certain conditions. Human papillomavirus (HPV) vaccine  If recommended by your health care provider, you may need three doses over 6 months. You may receive vaccines as individual doses or as more than one vaccine together in one shot (combination vaccines). Talk with your health care provider about the risks and benefits of combination vaccines. What tests do I need? Blood tests  Lipid and cholesterol levels. These may be checked every 5 years, or more frequently if you are over 50 years old.  Hepatitis C test.  Hepatitis B test. Screening  Lung cancer screening. You may have this screening every year starting at age 55 if you have a 30-pack-year history of smoking and currently smoke or have quit within the past 15 years.  Colorectal cancer screening. All adults should have this screening starting at age 50 and continuing until age 75. Your health care provider may recommend screening at age 45 if you are at increased risk. You will have tests every 1-10 years, depending on your results and the type of screening test.  Diabetes screening. This is done by checking your blood sugar (glucose) after you have not eaten for a while (fasting). You may have this   done every 1-3 years.  Mammogram. This may be done every 1-2 years. Talk with your health care provider about when you should start having regular mammograms. This may depend on whether you have a family history of breast cancer.  BRCA-related cancer screening. This may be done if you have a family history of breast, ovarian, tubal, or peritoneal  cancers.  Pelvic exam and Pap test. This may be done every 3 years starting at age 60. Starting at age 7, this may be done every 5 years if you have a Pap test in combination with an HPV test. Other tests  Sexually transmitted disease (STD) testing.  Bone density scan. This is done to screen for osteoporosis. You may have this scan if you are at high risk for osteoporosis. Follow these instructions at home: Eating and drinking  Eat a diet that includes fresh fruits and vegetables, whole grains, lean protein, and low-fat dairy.  Take vitamin and mineral supplements as recommended by your health care provider.  Do not drink alcohol if: ? Your health care provider tells you not to drink. ? You are pregnant, may be pregnant, or are planning to become pregnant.  If you drink alcohol: ? Limit how much you have to 0-1 drink a day. ? Be aware of how much alcohol is in your drink. In the [place] one drink equals one 12 oz bottle of beer (355 mL), one 5 oz glass of wine (148 mL), or one 1 oz glass of hard liquor (44 mL). Lifestyle  Take daily care of your teeth and gums.  Stay active. Exercise for at least 30 minutes on 5 or more days each week.  Do not use any products that contain nicotine or tobacco, such as cigarettes, e-cigarettes, and chewing tobacco. If you need help quitting, ask your health care provider.  If you are sexually active, practice safe sex. Use a condom or other form of birth control (contraception) in order to prevent pregnancy and STIs (sexually transmitted infections).  If told by your health care provider, take low-dose aspirin daily starting at age 48. What's next?  Visit your health care provider once a year for a well check visit.  Ask your health care provider how often you should have your eyes and teeth checked.  Stay up to date on all vaccines. This information is not intended to replace advice given to you by your health care provider. Make sure you  discuss any questions you have with your health care provider. Document Revised: 04/15/2018 Document Reviewed: 04/15/2018 Elsevier Patient Education  2020 [place] Breast self-awareness is knowing how your breasts look and feel. Doing breast self-awareness is important. It allows you to catch a breast problem early while it is still small and can be treated. All women should do breast self-awareness, including women who have had breast implants. Tell your doctor if you notice a change in your breasts. What you need:  A mirror.  A well-lit room. How to do a breast self-exam A breast self-exam is one way to learn what is normal for your breasts and to check for changes. To do a breast self-exam: Look for changes  1. Take off all the clothes above your waist. 2. Stand in front of a mirror in a room with good lighting. 3. Put your hands on your hips. 4. Push your hands down. 5. Look at your breasts and nipples in the mirror to see if one breast or nipple looks different from the  other. Check to see if: ? The shape of one breast is different. ? The size of one breast is different. ? There are wrinkles, dips, and bumps in one breast and not the other. 6. Look at each breast for changes in the skin, such as: ? Redness. ? Scaly areas. 7. Look for changes in your nipples, such as: ? Liquid around the nipples. ? Bleeding. ? Dimpling. ? Redness. ? A change in where the nipples are. Feel for changes  1. Lie on your back on the floor. 2. Feel each breast. To do this, follow these steps: ? Pick a breast to feel. ? Put the arm closest to that breast above your head. ? Use your other arm to feel the nipple area of your breast. Feel the area with the pads of your three middle fingers by making small circles with your fingers. For the first circle, press lightly. For the second circle, press harder. For the third circle, press even harder. ? Keep making circles with  your fingers at the different pressures as you move down your breast. Stop when you feel your ribs. ? Move your fingers a little toward the center of your body. ? Start making circles with your fingers again, this time going up until you reach your collarbone. ? Keep making up-and-down circles until you reach your armpit. Remember to keep using the three pressures. ? Feel the other breast in the same way. 3. Sit or stand in the tub or shower. 4. With soapy water on your skin, feel each breast the same way you did in step 2 when you were lying on the floor. Write down what you find Writing down what you find can help you remember what to tell your doctor. Write down:  What is normal for each breast.  Any changes you find in each breast, including: ? The kind of changes you find. ? Whether you have pain. ? Size and location of any lumps.  When you last had your menstrual period. General tips  Check your breasts every month.  If you are breastfeeding, the best time to check your breasts is after you feed your baby or after you use a breast pump.  If you get menstrual periods, the best time to check your breasts is 5-7 days after your menstrual period is over.  With time, you will become comfortable with the self-exam, and you will begin to know if there are changes in your breasts. Contact a doctor if you:  See a change in the shape or size of your breasts or nipples.  See a change in the skin of your breast or nipples, such as red or scaly skin.  Have fluid coming from your nipples that is not normal.  Find a lump or thick area that was not there before.  Have pain in your breasts.  Have any concerns about your breast health. Summary  Breast self-awareness includes looking for changes in your breasts, as well as feeling for changes within your breasts.  Breast self-awareness should be done in front of a mirror in a well-lit room.  You should check your breasts every month.  If you get menstrual periods, the best time to check your breasts is 5-7 days after your menstrual period is over.  Let your doctor know of any changes you see in your breasts, including changes in size, changes on the skin, pain or tenderness, or fluid from your nipples that is not normal. This information is not  intended to replace advice given to you by your health care provider. Make sure you discuss any questions you have with your health care provider. Document Revised: 03/23/2018 Document Reviewed: 03/23/2018 Elsevier Patient Education  [place].      Kegel Exercises  Kegel exercises can help strengthen your pelvic floor muscles. The pelvic floor is a group of muscles that support your rectum, small intestine, and bladder. In females, pelvic floor muscles also help support the womb (uterus). These muscles help you control the flow of urine and stool. Kegel exercises are painless and simple, and they do not require any equipment. Your provider may suggest Kegel exercises to:  Improve bladder and bowel control.  Improve sexual response.  Improve weak pelvic floor muscles after surgery to remove the uterus (hysterectomy) or pregnancy (females).  Improve weak pelvic floor muscles after prostate gland removal or surgery (males). Kegel exercises involve squeezing your pelvic floor muscles, which are the same muscles you squeeze when you try to stop the flow of urine or keep from passing gas. The exercises can be done while sitting, standing, or lying down, but it is best to vary your position. Exercises How to do Kegel exercises: 1. Squeeze your pelvic floor muscles tight. You should feel a tight lift in your rectal area. If you are a female, you should also feel a tightness in your vaginal area. Keep your stomach, buttocks, and legs relaxed. 2. Hold the muscles tight for up to 10 seconds. 3. Breathe normally. 4. Relax your muscles. 5. Repeat as told by your health care  provider. Repeat this exercise daily as told by your health care provider. Continue to do this exercise for at least 4-6 weeks, or for as long as told by your health care provider. You may be referred to a physical therapist who can help you learn more about how to do Kegel exercises. Depending on your condition, your health care provider may recommend:  Varying how long you squeeze your muscles.  Doing several sets of exercises every day.  Doing exercises for several weeks.  Making Kegel exercises a part of your regular exercise routine. This information is not intended to replace advice given to you by your health care provider. Make sure you discuss any questions you have with your health care provider. Document Revised: 03/24/2018 Document Reviewed: 03/24/2018 Elsevier Patient Education  [place].

## 2020-07-31 ENCOUNTER — Other Ambulatory Visit: Payer: Self-pay

## 2020-07-31 ENCOUNTER — Encounter: Payer: Self-pay | Admitting: Obstetrics and Gynecology

## 2020-07-31 ENCOUNTER — Ambulatory Visit (INDEPENDENT_AMBULATORY_CARE_PROVIDER_SITE_OTHER): Payer: BC Managed Care – PPO | Admitting: Obstetrics and Gynecology

## 2020-07-31 VITALS — BP 118/73 | HR 74 | Ht 65.0 in | Wt 152.3 lb

## 2020-07-31 DIAGNOSIS — Z01419 Encounter for gynecological examination (general) (routine) without abnormal findings: Secondary | ICD-10-CM | POA: Diagnosis not present

## 2020-07-31 DIAGNOSIS — C9110 Chronic lymphocytic leukemia of B-cell type not having achieved remission: Secondary | ICD-10-CM | POA: Diagnosis not present

## 2020-07-31 DIAGNOSIS — Z1231 Encounter for screening mammogram for malignant neoplasm of breast: Secondary | ICD-10-CM

## 2020-07-31 DIAGNOSIS — Z8742 Personal history of other diseases of the female genital tract: Secondary | ICD-10-CM | POA: Diagnosis not present

## 2020-07-31 DIAGNOSIS — N952 Postmenopausal atrophic vaginitis: Secondary | ICD-10-CM

## 2020-07-31 NOTE — Progress Notes (Signed)
ANNUAL PREVENTATIVE CARE GYNECOLOGY  ENCOUNTER NOTE  Subjective:       Cathy [name] is a 64 y.o. [id number] female here for a routine annual gynecologic exam. The patient is not currently sexually active. The patient has never been taking hormone replacement therapy. Patient denies post-menopausal vaginal bleeding. The patient wears seatbelts: yes. . Has the patient ever been transfused or tattooed?: no.   Current complaints/concerns: 1.  Patient reports that she is currently on a daily chemotherapy drug for her CLL.   2. She complains of intermittent vaginal itching and burning over the past several weeks. Denies vaginal discharge. Notes that she noticed an improvement in her symptoms after taking probiotics.  3. Reports that she has retired from her job. Is enjoying more of her free time to do other things.    Gynecologic History Patient's last menstrual period was 01/11/2017 (approximate). Contraception: post menopausal status Last Pap: 07/24/2019. Results were: normal. History of abnormal pap smear in 02/2018 (ASCUS HR HPV -) and 10/2016 (NILM, HR HPV+).  Last mammogram: 07/07/2019. Results were: normal Last Colonoscopy: 08/01/2019.  Results were: normal.  Last Dexa Scan: patient has never had one.    Obstetric History OB History  Gravida Para Term Preterm AB Living  3 3 3     3   SAB IAB Ectopic Multiple Live Births          3    # Outcome Date GA Lbr Len/2nd Weight Sex Delivery Anes PTL Lv  3 Term 1990   7 lb (3.175 kg) F Vag-Spont   LIV  2 Term 1989   7 lb (3.175 kg) F Vag-Spont   LIV  1 Term 1987   8 lb 1.8 oz (3.679 kg) M Vag-Spont   LIV    Past Medical History:  Diagnosis Date  . Anxiety   . Chest pain   . Chicken pox   . CLL (chronic lymphocytic leukemia) ([place])    dx'ed 2015   . CLL (chronic lymphocytic leukemia) ([place])   . Complication of anesthesia    woke up with severe cramps in extremities (26 years ago)  . DDD (degenerative disc disease), cervical   . DUB  (dysfunctional uterine bleeding)    s/p D&C Dr. Enzo Bi   . Dysthymia   . Hyperlipidemia   . Influenza    history of x 2   . Personal history of chemotherapy   . Splenomegaly    hx of  . Thyroid nodule     Family History  Problem Relation Age of Onset  . Stroke Mother 24       CVA  . Arthritis Mother   . Deep vein thrombosis Brother 45       recurrent DVTs.  . Stroke Maternal Grandmother   . Colon cancer Maternal Grandmother   . Cancer Maternal Grandmother        colon cancer dx'ed 78s   . Stroke Maternal Grandfather   . Diabetes Maternal Grandfather   . Diabetes Sister   . Breast cancer Neg Hx   . Ovarian cancer Neg Hx     Past Surgical History:  Procedure Laterality Date  . COLONOSCOPY WITH PROPOFOL N/A 08/01/2019   Procedure: COLONOSCOPY WITH PROPOFOL;  Surgeon: Lucilla Lame, MD;  Location: [facility];  Service: Endoscopy;  Laterality: N/A;  . HYSTEROSCOPY WITH D & C N/A 06/02/2016   Procedure: DILATATION AND CURETTAGE /HYSTEROSCOPY 2/2 DUB;  Surgeon: Brayton Mars, MD;  Location: [facility];  Service:  Gynecology;  Laterality: N/A;  . TUBAL LIGATION      Social History   Socioeconomic History  . Marital status: Divorced    Spouse name: Not on file  . Number of children: Not on file  . Years of education: Not on file  . Highest education level: Not on file  Occupational History  . Not on file  Tobacco Use  . Smoking status: Never Smoker  . Smokeless tobacco: Never Used  Vaping Use  . Vaping Use: Never used  Substance and Sexual Activity  . Alcohol use: Not Currently    Comment: occasionally   . Drug use: No  . Sexual activity: Yes    Birth control/protection: Surgical  Other Topics Concern  . Not on file  Social History Narrative   B.S    Works at [employer]    3 kids (2 girls and 1 boy) and grandchildren    Lives in [place] Determinants of Health   Financial Resource Strain: Not on file  Food Insecurity:  Not on file  Transportation Needs: Not on file  Physical Activity: Not on file  Stress: Not on file  Social Connections: Not on file  Intimate Partner Violence: Not on file    Current Outpatient Medications on File Prior to Visit  Medication Sig Dispense Refill  . acyclovir (ZOVIRAX) 400 MG tablet Take 1 tablet (400 mg total) by mouth daily. 30 tablet 3  . b complex vitamins capsule Take 1 capsule by mouth daily.    . Cholecalciferol (VITAMIN D3) 5000 units TABS Take by mouth daily.    . ferrous sulfate 325 (65 FE) MG tablet Take 325 mg by mouth every other day.    . fluorouracil (EFUDEX) 5 % cream Apply 40 g topically in the morning and at bedtime. 5% cream, apply small amount topically twice daily    . ondansetron (ZOFRAN) 4 MG tablet Take 4 mg by mouth every 6 (six) hours as needed.    . Probiotic Product (PROBIOTIC DAILY PO) Take by mouth.    . prochlorperazine (COMPAZINE) 10 MG tablet Take 1 tablet by mouth 3 (three) times daily as needed.    . venetoclax (VENCLEXTA) 100 MG tablet Take 4 tablets by mouth daily.    . Glucosamine-Chondroitin 500-400 MG CAPS Take 3 tablets by mouth daily.     No current facility-administered medications on file prior to visit.    No Known Allergies    Review of Systems ROS Review of Systems - General ROS: negative for - chills, fatigue, fever, hot flashes, night sweats, weight gain or weight loss Psychological ROS: negative for - anxiety, decreased libido, depression, mood swings, physical abuse or sexual abuse Ophthalmic ROS: negative for - blurry vision, eye pain or loss of vision ENT ROS: negative for - headaches, hearing change, visual changes or vocal changes Allergy and Immunology ROS: negative for - hives, itchy/watery eyes or seasonal allergies Hematological and Lymphatic ROS: negative for - bleeding problems, bruising, swollen lymph nodes or weight loss Endocrine ROS: negative for - galactorrhea, hair pattern changes, hot flashes,  malaise/lethargy, mood swings, palpitations, polydipsia/polyuria, skin changes, temperature intolerance or unexpected weight changes Breast ROS: negative for - new or changing breast lumps or nipple discharge Respiratory ROS: negative for - cough or shortness of breath Cardiovascular ROS: negative for - chest pain, irregular heartbeat, palpitations or shortness of breath Gastrointestinal ROS: no abdominal pain, change in bowel habits, or black or bloody stools Genito-Urinary ROS: no  dysuria, trouble voiding, or hematuria. No abnormal vaginal discharge or bleeding. Positive for vaginal itching and burning.  Musculoskeletal ROS: negative for - joint pain or joint stiffness Neurological ROS: negative for - bowel and bladder control changes Dermatological ROS: negative for rash and skin lesion changes   Objective:   BP 118/73   Pulse 74   Ht 5\' 5"  (1.651 m)   Wt 152 lb 4.8 oz (69.1 kg)   LMP 01/11/2017 (Approximate)   BMI 25.34 kg/m  CONSTITUTIONAL: Well-developed, well-nourished female in no acute distress.  PSYCHIATRIC: Normal mood and affect. Normal behavior. Normal judgment and thought content. [place]: Alert and oriented to person, place, and time. Normal muscle tone coordination. No cranial nerve deficit noted. HENT:  Normocephalic, atraumatic, External right and left ear normal. Oropharynx is clear and moist EYES: Conjunctivae and EOM are normal. Pupils are equal, round, and reactive to light. No scleral icterus.  NECK: Normal range of motion, supple, no masses.  Normal thyroid.  SKIN: Skin is warm and dry. No rash noted. Not diaphoretic. No erythema. No pallor. CARDIOVASCULAR: Normal heart rate noted, regular rhythm, no murmur. RESPIRATORY: Clear to auscultation bilaterally. Effort and breath sounds normal, no problems with respiration noted. BREASTS: Symmetric in size. No masses, skin changes, nipple drainage, or lymphadenopathy. ABDOMEN: Soft, normal bowel sounds, no distention  noted.  No tenderness, rebound or guarding.  BLADDER: Normal PELVIC:  Bladder no bladder distension noted  Urethra: normal appearing urethra with no masses, tenderness or lesions  Vulva: normal appearing vulva with no masses, tenderness or lesions  Vagina: atrophic (moderate). No discharge or lesions.   Cervix: mildly atrophic appearing cervix without discharge or lesions  Uterus: uterus is normal size, shape, consistency and nontender  Adnexa: normal adnexa in size, nontender and no masses  RV: External Exam NormaI, No Rectal Masses and Normal Sphincter tone  MUSCULOSKELETAL: Normal range of motion. No tenderness.  No cyanosis, clubbing, or edema.  2+ distal pulses. LYMPHATIC: No Axillary, Supraclavicular, or Inguinal Adenopathy.   Labs: Lab Results  Component Value Date   WBC 30.3 (H) 04/15/2019   HGB 12.4 04/15/2019   HCT 38.4 04/15/2019   MCV 88.9 04/15/2019   PLT 269 04/15/2019    Lab Results  Component Value Date   CREATININE 0.93 04/15/2019   BUN 22 (H) 04/15/2019   NA 137 04/15/2019   K 3.8 04/15/2019   CL 103 04/15/2019   CO2 27 04/15/2019    Lab Results  Component Value Date   ALT 15 04/15/2019   AST 17 04/15/2019   ALKPHOS 63 04/15/2019   BILITOT 0.8 04/15/2019    Lab Results  Component Value Date   CHOL 218 (H) 08/10/2019   HDL 68 08/10/2019   LDLCALC 140 (H) 08/10/2019   TRIG 57 08/10/2019   CHOLHDL 3.2 08/10/2019    Lab Results  Component Value Date   TSH 2.350 08/10/2019    Lab Results  Component Value Date   HGBA1C 5.5 11/04/2016     Assessment:   1. Encounter for well woman exam with routine gynecological exam   2. Breast cancer screening by mammogram   3. CLL (chronic lymphocytic leukemia) ([place])   4. History of abnormal cervical Pap smear   5. Atrophic vaginitis      Plan:  - Pap: Not needed. Last pap smear normal. Previous pap with ASCUS but HPV neg. Next due in 2023.  - Mammogram: Ordered. Notes that she will wait until  after the new year to  schedule as her insurance will change.  - Stool Guaiac Testing:  Not Indicated. Up to date on colonoscopy.  - Labs: None ordered. Has had labs performed by PCP and Oncologist within the past year.  - Routine preventative health maintenance measures emphasized: Exercise/Diet/Weight control, Tobacco Warnings, Alcohol/Substance use risks and Stress Management.  - Continue to encourage Vitamin D/caklcium supplementation for prevention of osteoporosis.  - COVID Vaccination status: has completed vaccination series, and received booster shot.  - Flu vaccine: up to date.  - Chronic lymphocytic leukemia, managed with oral chemotherapeutic agent.  Continue routine f//u with Oncologist.  - Discussed findings of vaginal atrophy, likely the cause of her vaginal irritation. Advised on OTC treatments such as coconut oil or vaginal moisturizers.  Could also consider local hormonal therapy. Patient notes she will try OTC options for now.   Return to [place], MD [facility]

## 2021-04-23 ENCOUNTER — Other Ambulatory Visit: Payer: Self-pay

## 2021-04-23 ENCOUNTER — Ambulatory Visit (INDEPENDENT_AMBULATORY_CARE_PROVIDER_SITE_OTHER)
Admit: 2021-04-23 | Discharge: 2021-04-23 | Disposition: A | Discharge status: Home / Self Care | Payer: BC Managed Care – PPO | Attending: Emergency Medicine | Admitting: Emergency Medicine

## 2021-04-23 ENCOUNTER — Encounter: Payer: Self-pay | Admitting: Emergency Medicine

## 2021-04-23 ENCOUNTER — Ambulatory Visit
Admission: EM | Admit: 2021-04-23 | Discharge: 2021-04-23 | Disposition: A | Discharge status: Home / Self Care | Payer: BC Managed Care – PPO | Attending: Emergency Medicine | Admitting: Emergency Medicine

## 2021-04-23 DIAGNOSIS — H6121 Impacted cerumen, right ear: Secondary | ICD-10-CM | POA: Diagnosis not present

## 2021-04-23 DIAGNOSIS — R519 Headache, unspecified: Secondary | ICD-10-CM

## 2021-04-23 NOTE — ED Provider Notes (Signed)
[facility]    CSN: [id number] Arrival date & time: 04/23/21  1022      History   Chief Complaint Chief Complaint  Patient presents with   Otalgia    right    HPI Cathy [name] is a 65 y.o. female.   HPI  65 year old female here for evaluation of headache and right ear pain.  Patient reports that her headache began first and it starts at the level of her right ear and radiates up into her parietal area and back to the occiput.  She states that then she noticed that when she swallowed or chewed she would get some clicking in her ear and some pain going down the right side of her neck.  She is on oral chemotherapy for CLL and has history of neutropenia.  She reports that her counts recently have been normal so she thought she might fight off which she believed was an ear infection.  She is can radial indication so she was someone to take a look at her ear to make sure that there was not something else at play.  She denies fever, drainage from her ear, changes to her hearing, changes to vision, nausea, numbness, tingling, or weakness in her extremities.  Past Medical History:  Diagnosis Date   Anxiety    Chest pain    Chicken pox    CLL (chronic lymphocytic leukemia) ([place])    dx'ed 2015    CLL (chronic lymphocytic leukemia) ([facility])    Complication of anesthesia    woke up with severe cramps in extremities (26 years ago)   DDD (degenerative disc disease), cervical    DUB (dysfunctional uterine bleeding)    s/p D&C Dr. Enzo Bi    Dysthymia    Hyperlipidemia    Influenza    history of x 2    Personal history of chemotherapy    Splenomegaly    hx of   Thyroid nodule     Patient Active Problem List   Diagnosis Date Noted   Red blood cell antibody positive 02/28/2020   Pulmonary nodule seen on imaging study 12/05/2019   GERD (gastroesophageal reflux disease) 12/05/2019   Hyperphosphatemia 11/30/2019   Rheumatoid factor positive 08/16/2019   Lumbar  radiculopathy 08/16/2019   Annual physical exam 08/16/2019   Special screening for malignant neoplasms, colon    Atypical squamous cells of undetermined significance on cytologic smear of cervix (ASC-US) 06/22/2019   Acute pain of right shoulder 12/08/2017   Low back pain 08/26/2017   Knee pain 08/26/2017   History of thyroid nodule 08/26/2017   Leukopenia 07/14/2017   Thyroid nodule 07/14/2017   Dysthymia 07/14/2017   Anxiety 07/14/2017   Cervical high risk HPV (human papillomavirus) test positive 11/11/2016   Vitamin D deficiency 11/11/2016   Hyperlipidemia 11/11/2016   Status post D&C 06/11/2016   Endometrial polyp 06/11/2016   Uterine leiomyoma 05/20/2016   Thickened endometrium 05/20/2016   Abdominal cramping 04/30/2016   CLL (chronic lymphocytic leukemia) ([place]) 12/15/2014   Chest pain 12/01/2013    Past Surgical History:  Procedure Laterality Date   COLONOSCOPY WITH PROPOFOL N/A 08/01/2019   Procedure: COLONOSCOPY WITH PROPOFOL;  Surgeon: Lucilla Lame, MD;  Location: [place];  Service: Endoscopy;  Laterality: N/A;   HYSTEROSCOPY WITH D & C N/A 06/02/2016   Procedure: DILATATION AND CURETTAGE /HYSTEROSCOPY 2/2 DUB;  Surgeon: Brayton Mars, MD;  Location: [facility];  Service: Gynecology;  Laterality: N/A;   TUBAL LIGATION  OB History     Gravida  3   Para  3   Term  3   Preterm      AB      Living  3      SAB      IAB      Ectopic      Multiple      Live Births  3            Home Medications    Prior to Admission medications   Medication Sig Start Date End Date Taking? Authorizing Provider  acyclovir (ZOVIRAX) 400 MG tablet Take 1 tablet (400 mg total) by mouth daily. 09/15/18   Lloyd Huger, MD  b complex vitamins capsule Take 1 capsule by mouth daily.    [provider]  Cholecalciferol (VITAMIN D3) 5000 units TABS Take by mouth daily.    [provider]  ferrous sulfate 325 (65 FE) MG tablet  Take 325 mg by mouth every other day. 01/17/20 01/16/21  [provider]  fluorouracil (EFUDEX) 5 % cream Apply 40 g topically in the morning and at bedtime. 5% cream, apply small amount topically twice daily 07/10/20 07/10/21  [provider]  Glucosamine-Chondroitin 500-400 MG CAPS Take 3 tablets by mouth daily.    [provider]  ondansetron (ZOFRAN) 4 MG tablet Take 4 mg by mouth every 6 (six) hours as needed. 04/22/20   [provider]  Probiotic Product (PROBIOTIC DAILY PO) Take by mouth.    [provider]  prochlorperazine (COMPAZINE) 10 MG tablet Take 1 tablet by mouth 3 (three) times daily as needed. 07/20/20   [provider]    Family History Family History  Problem Relation Age of Onset   Stroke Mother 10       CVA   Arthritis Mother    Deep vein thrombosis Brother 29       recurrent DVTs.   Stroke Maternal Grandmother    Colon cancer Maternal Grandmother    Cancer Maternal Grandmother        colon cancer dx'ed 25s    Stroke Maternal Grandfather    Diabetes Maternal Grandfather    Diabetes Sister    Breast cancer Neg Hx    Ovarian cancer Neg Hx     Social History Social History   Tobacco Use   Smoking status: Never   Smokeless tobacco: Never  Vaping Use   Vaping Use: Never used  Substance Use Topics   Alcohol use: Not Currently    Comment: occasionally    Drug use: No     Allergies   Patient has no known allergies.   Review of Systems Review of Systems  Constitutional:  Negative for activity change, appetite change and fever.  HENT:  Positive for ear pain. Negative for congestion, ear discharge, rhinorrhea and sore throat.   Gastrointestinal:  Negative for nausea and vomiting.  Musculoskeletal:  Negative for neck pain.  Skin:  Negative for rash.  Neurological:  Positive for headaches. Negative for dizziness, syncope and facial asymmetry.  Hematological: Negative.   Psychiatric/Behavioral: Negative.       Physical Exam Triage Vital Signs ED Triage Vitals  Enc Vitals Group     BP 04/23/21 1046 113/75     Pulse Rate 04/23/21 1046 66     Resp 04/23/21 1046 18     Temp 04/23/21 1046 97.8 F (36.6 C)     Temp Source 04/23/21 1046 Oral  SpO2 04/23/21 1046 100 %     Weight --      Height --      Head Circumference --      Peak Flow --      Pain Score 04/23/21 1045 4     Pain Loc --      Pain Edu? --      Excl. in [place]? --    No data found.  Updated Vital Signs BP 113/75 (BP Location: Right Arm)   Pulse 66   Temp 97.8 F (36.6 C) (Oral)   Resp 18   LMP 01/11/2017 (Approximate)   SpO2 100%   Visual Acuity Right Eye Distance:   Left Eye Distance:   Bilateral Distance:    Right Eye Near:   Left Eye Near:    Bilateral Near:     Physical Exam Vitals and nursing note reviewed.  Constitutional:      General: She is not in acute distress.    Appearance: Normal appearance. She is not ill-appearing.  HENT:     Head: Normocephalic and atraumatic.     Right Ear: External ear normal. There is impacted cerumen.     Left Ear: Tympanic membrane, ear canal and external ear normal. There is no impacted cerumen.     Nose: Nose normal. No congestion or rhinorrhea.     Mouth/Throat:     Mouth: Mucous membranes are moist.     Pharynx: Oropharynx is clear. No posterior oropharyngeal erythema.  Eyes:     General: No scleral icterus.    Extraocular Movements: Extraocular movements intact.     Conjunctiva/sclera: Conjunctivae normal.     Pupils: Pupils are equal, round, and reactive to light.  Cardiovascular:     Rate and Rhythm: Normal rate and regular rhythm.     Pulses: Normal pulses.     Heart sounds: Normal heart sounds. No murmur heard.   No gallop.  Pulmonary:     Effort: Pulmonary effort is normal.     Breath sounds: Normal breath sounds. No wheezing, rhonchi or rales.  Musculoskeletal:     Cervical back: Normal range of motion and neck supple.  Lymphadenopathy:      Cervical: No cervical adenopathy.  Skin:    General: Skin is warm and dry.     Capillary Refill: Capillary refill takes less than 2 seconds.     Findings: No erythema or rash.  Neurological:     General: No focal deficit present.     Mental Status: She is alert and oriented to person, place, and time.  Psychiatric:        Mood and Affect: Mood normal.        Behavior: Behavior normal.        Thought Content: Thought content normal.        Judgment: Judgment normal.     UC Treatments / Results  Labs (all labs ordered are listed, but only abnormal results are displayed) Labs Reviewed - No data to display  EKG   Radiology CT HEAD WO CONTRAST (5MM)  Result Date: 04/23/2021 CLINICAL DATA:  Headache. EXAM: CT HEAD WITHOUT CONTRAST TECHNIQUE: Contiguous axial images were obtained from the base of the skull through the vertex without intravenous contrast. COMPARISON:  None. FINDINGS: Brain: No evidence of acute infarction, hemorrhage, hydrocephalus, extra-axial collection or mass lesion/mass effect. Vascular: No hyperdense vessel or unexpected calcification. Skull: Normal. Negative for fracture or focal lesion. Sinuses/Orbits: No acute finding. Other: None. IMPRESSION: No acute intracranial abnormality  seen. Electronically Signed   By: Marijo Conception M.D.   On: 04/23/2021 13:06    Procedures Procedures (including critical care time)  Medications Ordered in UC Medications - No data to display  Initial Impression / Assessment and Plan / UC Course  I have reviewed the triage vital signs and the nursing notes.  Pertinent labs & imaging results that were available during my care of the patient were reviewed by me and considered in my medical decision making (see chart for details).  Patient is a very pleasant, nontoxic-appearing 65 year old female here for evaluation of right ear pain and right-sided headache.  Headache began first within the ear pain developed.  She describes it as a  clicking when she swallows, talks, or chews.  Patient describes the right side of her head as feeling tender.  Physical exam reveals cerumen occluding the right tympanic membrane.  The external ear is normal and there is no pain with movement of the auricle of the ear.  Left tympanic membrane is pearly gray with a normal light reflex and clear external auditory canal.  Cranial nerves II through XII are intact.  Patient does have mild discomfort with palpation of the right parietal occipital area but states it is not significantly tender.  Oropharyngeal exam is noncontributory.  No cervical lymphadenopathy patient on exam.  Cardiopulmonary exam reveals clear lung sounds in all fields.  Will order ear lavage to remove impacted cerumen and then reassess for the presence of a possible infection.  If there is no signs of infection, given the patient's cancer and neutropenia history, will order CT scan of the head to rule out acute acute intracranial process.  Right ear reevaluated after ear lavage.  The cerumen has been cleared for external auditory canal.  The EAC is clear and the tympanic membrane is pearly gray with a normal light reflex.  There is no erythema, effusion, or pus.  Patient has no tenderness with movement of the auricle of the ear.  She does have some mild tenderness with palpation of the right eustachian tube externally.  She does still have tenderness on the right side of her head when palpating the overlying skin.  There is no redness, rashes, or lesions noted.  There is no tenderness or erythema over the mastoid process.  Given patient's history of cancer, neutropenia, and ongoing chemotherapy there is a concern for acute intracranial process.  Will order CT scan of head.  To the head is negative for acute intracranial process.  Will discharge patient home with a diagnosis of right-sided headache and will treat conservatively with over-the-counter Tylenol and ibuprofen.  Patient advised to  follow-up with her primary care provider for any continued or worsening symptoms.   Final Clinical Impressions(s) / UC Diagnoses   Final diagnoses:  Acute nonintractable headache, unspecified headache type     Discharge Instructions      Your examination today did not reveal the presence of an infection and the CT scan did not reveal the presence of any brain bleeds, lesions, or masses.  The cause of your headache is not clear.  I recommend using over-the-counter Tylenol and ibuprofen according to package instructions as needed for pain.  If your symptoms continue I recommend following with your primary care provider.  If they worsen I recommend going to the ER for evaluation.     ED Prescriptions   None    PDMP not reviewed this encounter.   Cathy Canada, NP 04/23/21 1318

## 2021-04-23 NOTE — Discharge Instructions (Addendum)
Your examination today did not reveal the presence of an infection and the CT scan did not reveal the presence of any brain bleeds, lesions, or masses.  The cause of your headache is not clear.  I recommend using over-the-counter Tylenol and ibuprofen according to package instructions as needed for pain.  If your symptoms continue I recommend following with your primary care provider.  If they worsen I recommend going to the ER for evaluation.

## 2021-04-23 NOTE — ED Triage Notes (Signed)
Pt presents today with c/o of right ear pain and headache x 1 week. Denies fever.

## 2021-04-23 NOTE — ED Notes (Signed)
Patient has been approved for CPT 70450. Obtained via [facility] . Valid for 04/23/21 through 05/22/2021. Authorization # [id number].

## 2021-06-05 ENCOUNTER — Telehealth: Payer: Self-pay | Admitting: Internal Medicine

## 2021-06-05 NOTE — Telephone Encounter (Signed)
Please call to schedule f/u routine health maintenance and f/u thyroid nodule per [facility] oncology

## 2021-06-06 NOTE — Telephone Encounter (Signed)
Patient scheduled for 07/30/21. Verified with Patient and she wrote this down in her calendar

## 2021-07-30 ENCOUNTER — Ambulatory Visit: Payer: BC Managed Care – PPO | Admitting: Internal Medicine

## 2021-07-30 ENCOUNTER — Other Ambulatory Visit: Payer: Self-pay

## 2021-08-15 ENCOUNTER — Ambulatory Visit
Admission: EM | Admit: 2021-08-15 | Discharge: 2021-08-15 | Disposition: A | Discharge status: Home / Self Care | Payer: 59

## 2021-08-15 ENCOUNTER — Encounter: Payer: Self-pay | Admitting: Emergency Medicine

## 2021-08-15 ENCOUNTER — Other Ambulatory Visit: Payer: Self-pay

## 2021-08-15 DIAGNOSIS — A084 Viral intestinal infection, unspecified: Secondary | ICD-10-CM

## 2021-08-15 MED ORDER — LOPERAMIDE HCL 2 MG PO CAPS: 2.0000 mg | ORAL_CAPSULE | Freq: Four times a day (QID) | ORAL | 0 refills | Status: AC | PRN

## 2021-08-15 MED ORDER — ALUMINUM-MAGNESIUM-SIMETHICONE 200-200-20 MG/5ML PO SUSP: 30.0000 mL | Freq: Three times a day (TID) | ORAL | 0 refills | Status: AC

## 2021-08-15 MED ORDER — AZITHROMYCIN 250 MG PO TABS: 250.0000 mg | ORAL_TABLET | Freq: Every day | ORAL | 0 refills | Status: AC

## 2021-08-15 NOTE — ED Triage Notes (Signed)
Patient has had symptoms for 8 days.  At this point has abdominal pain and diarrhea have continued.    Initially had fever, fever as high as 102.  No longer running fever,  patient has had vomiting, but that has stopped.

## 2021-08-15 NOTE — ED Provider Notes (Signed)
[facility]    CSN: [id number] Arrival date & time: 08/15/21  1419      History   Chief Complaint Chief Complaint  Patient presents with   Fatigue    HPI Cathy [name] is a 65 y.o. female.   Patient presents with mid abdominal pain and persistent diarrhea for 8 days.  Last episode of diarrhea today.  Unable to tolerate food or liquids.  Associated bloating and indigestion.  Initially vomiting and fevers but symptoms has resolved.  History of chronic leukemia, taking chemotherapy agent daily.  Has not taken medication since symptoms began, notified oncologist.  Attempted to get appointment with primary care doctor who recently had baby and is no longer seeing sick patients.  Known sick contacts as all members of household had similar symptoms.  Past Medical History:  Diagnosis Date   Anxiety    Chest pain    Chicken pox    CLL (chronic lymphocytic leukemia) ([place])    dx'ed 2015    CLL (chronic lymphocytic leukemia) ([facility])    Complication of anesthesia    woke up with severe cramps in extremities (26 years ago)   DDD (degenerative disc disease), cervical    DUB (dysfunctional uterine bleeding)    s/p D&C Dr. Enzo Bi    Dysthymia    Hyperlipidemia    Influenza    history of x 2    Personal history of chemotherapy    Splenomegaly    hx of   Thyroid nodule     Patient Active Problem List   Diagnosis Date Noted   Red blood cell antibody positive 02/28/2020   Pulmonary nodule seen on imaging study 12/05/2019   GERD (gastroesophageal reflux disease) 12/05/2019   Hyperphosphatemia 11/30/2019   Rheumatoid factor positive 08/16/2019   Lumbar radiculopathy 08/16/2019   Annual physical exam 08/16/2019   Special screening for malignant neoplasms, colon    Atypical squamous cells of undetermined significance on cytologic smear of cervix (ASC-US) 06/22/2019   Acute pain of right shoulder 12/08/2017   Low back pain 08/26/2017   Knee pain 08/26/2017   History  of thyroid nodule 08/26/2017   Leukopenia 07/14/2017   Thyroid nodule 07/14/2017   Dysthymia 07/14/2017   Anxiety 07/14/2017   Cervical high risk HPV (human papillomavirus) test positive 11/11/2016   Vitamin D deficiency 11/11/2016   Hyperlipidemia 11/11/2016   Status post D&C 06/11/2016   Endometrial polyp 06/11/2016   Uterine leiomyoma 05/20/2016   Thickened endometrium 05/20/2016   Abdominal cramping 04/30/2016   CLL (chronic lymphocytic leukemia) ([place]) 12/15/2014   Chest pain 12/01/2013    Past Surgical History:  Procedure Laterality Date   COLONOSCOPY WITH PROPOFOL N/A 08/01/2019   Procedure: COLONOSCOPY WITH PROPOFOL;  Surgeon: Lucilla Lame, MD;  Location: [facility];  Service: Endoscopy;  Laterality: N/A;   HYSTEROSCOPY WITH D & C N/A 06/02/2016   Procedure: DILATATION AND CURETTAGE /HYSTEROSCOPY 2/2 DUB;  Surgeon: Brayton Mars, MD;  Location: [facility];  Service: Gynecology;  Laterality: N/A;   TUBAL LIGATION      OB History     Gravida  3   Para  3   Term  3   Preterm      AB      Living  3      SAB      IAB      Ectopic      Multiple      Live Births  3  Home Medications    Prior to Admission medications   Medication Sig Start Date End Date Taking? Authorizing Provider  acyclovir (ZOVIRAX) 400 MG tablet Take 1 tablet (400 mg total) by mouth daily. Patient not taking: Reported on 08/15/2021 09/15/18   Lloyd Huger, MD  b complex vitamins capsule Take 1 capsule by mouth daily. Patient not taking: Reported on 08/15/2021    [provider]  Cholecalciferol (VITAMIN D3) 5000 units TABS Take by mouth daily. Patient not taking: Reported on 08/15/2021    [provider]  ferrous sulfate 325 (65 FE) MG tablet Take 325 mg by mouth every other day. Patient not taking: Reported on 08/15/2021 01/17/20 01/16/21  [provider]  Glucosamine-Chondroitin 500-400 MG CAPS Take 3 tablets by mouth  daily. Patient not taking: Reported on 08/15/2021    [provider]  ondansetron (ZOFRAN) 4 MG tablet Take 4 mg by mouth every 6 (six) hours as needed. Patient not taking: Reported on 08/15/2021 04/22/20   [provider]  Probiotic Product (PROBIOTIC DAILY PO) Take by mouth. Patient not taking: Reported on 08/15/2021    [provider]  prochlorperazine (COMPAZINE) 10 MG tablet Take 1 tablet by mouth 3 (three) times daily as needed. Patient not taking: Reported on 08/15/2021 07/20/20   [provider]  VENCLEXTA 100 MG tablet Take by mouth. 07/29/21   [provider]    Family History Family History  Problem Relation Age of Onset   Stroke Mother 63       CVA   Arthritis Mother    Deep vein thrombosis Brother 105       recurrent DVTs.   Stroke Maternal Grandmother    Colon cancer Maternal Grandmother    Cancer Maternal Grandmother        colon cancer dx'ed 7s    Stroke Maternal Grandfather    Diabetes Maternal Grandfather    Diabetes Sister    Breast cancer Neg Hx    Ovarian cancer Neg Hx     Social History Social History   Tobacco Use   Smoking status: Never   Smokeless tobacco: Never  Vaping Use   Vaping Use: Never used  Substance Use Topics   Alcohol use: Not Currently    Comment: occasionally    Drug use: No     Allergies   Patient has no known allergies.   Review of Systems Review of Systems  Constitutional:  Positive for fever. Negative for activity change, appetite change, chills, diaphoresis, fatigue and unexpected weight change.  HENT: Negative.    Respiratory: Negative.    Cardiovascular: Negative.   Gastrointestinal:  Positive for abdominal pain and diarrhea. Negative for abdominal distention, anal bleeding, blood in stool, constipation, nausea, rectal pain and vomiting.  Skin: Negative.   Neurological: Negative.     Physical Exam Triage Vital Signs ED Triage Vitals  Enc Vitals Group     BP 08/15/21  1540 120/84     Pulse Rate 08/15/21 1540 88     Resp 08/15/21 1540 18     Temp 08/15/21 1540 97.8 F (36.6 C)     Temp Source 08/15/21 1540 Oral     SpO2 08/15/21 1540 98 %     Weight --      Height --      Head Circumference --      Peak Flow --      Pain Score 08/15/21 1536 0     Pain Loc --  Pain Edu? --      Excl. in [place]? --    No data found.  Updated Vital Signs BP 120/84 (BP Location: Left Arm)    Pulse 88    Temp 97.8 F (36.6 C) (Oral)    Resp 18    LMP 01/11/2017 (Approximate)    SpO2 98%   Visual Acuity Right Eye Distance:   Left Eye Distance:   Bilateral Distance:    Right Eye Near:   Left Eye Near:    Bilateral Near:     Physical Exam Constitutional:      Appearance: Normal appearance.  HENT:     Head: Normocephalic.  Eyes:     Extraocular Movements: Extraocular movements intact.  Pulmonary:     Effort: Pulmonary effort is normal.     Breath sounds: Normal breath sounds.  Abdominal:     General: Abdomen is flat. Bowel sounds are normal.     Palpations: Abdomen is soft.     Tenderness: There is abdominal tenderness in the epigastric area. There is no guarding.  Skin:    General: Skin is warm and dry.  Neurological:     Mental Status: She is alert and oriented to person, place, and time. Mental status is at baseline.  Psychiatric:        Mood and Affect: Mood normal.        Behavior: Behavior normal.     UC Treatments / Results  Labs (all labs ordered are listed, but only abnormal results are displayed) Labs Reviewed - No data to display  EKG   Radiology No results found.  Procedures Procedures (including critical care time)  Medications Ordered in UC Medications - No data to display  Initial Impression / Assessment and Plan / UC Course  I have reviewed the triage vital signs and the nursing notes.  Pertinent labs & imaging results that were available during my care of the patient were reviewed by me and considered in my medical  decision making (see chart for details).  Viral gastroenteritis  Vital signs are stable, patient is in no signs of distress, etiology of symptoms most likely viral but due to history and timeline of illness will trial antibiotic, Z-Pak prescribed, will further manage symptoms with Maalox, Imodium and patient endorses that she has nausea medications at home, advised increase fluid intake until appetite returns, recommended attempt to follow-up with provider PCP office or with urgent care as needed Final Clinical Impressions(s) / UC Diagnoses   Final diagnoses:  None   Discharge Instructions   None    ED Prescriptions   None    PDMP not reviewed this encounter.   Hans Eden, NP 08/15/21 1635

## 2021-08-15 NOTE — Discharge Instructions (Signed)
Your symptoms are most likely caused by a virus, it will work its way out your system over the next few days, because your symptoms have not improved after 8 days we will trial use of an antibiotic  Take azithromycin as directed  You can use Imodium twice a day once in the morning once in the evening and food to help with diarrhea, and be mindful over use of this medication may cause opposite effect constipation  You may use Maalox up to 4 times a day, attempt to use before eating to help reduce stomach acid and to reduce gas  You can use over-the-counter ibuprofen or Tylenol, which ever you have at home, to help manage fevers  Continue to promote hydration throughout the day by using electrolyte replacement solution such as Gatorade, body armor, Pedialyte, which ever you have at home  Try eating bland foods such as bread, rice, toast, fruit which are easier on the stomach to digest, avoid foods that are overly spicy, overly seasoned or greasy

## 2021-08-16 ENCOUNTER — Encounter: Payer: BC Managed Care – PPO | Admitting: Internal Medicine

## 2024-02-17 NOTE — Progress Notes (Signed)
 Repeat blood glucose 90.
